# Patient Record
Sex: Female | Born: 1980 | Race: White | Hispanic: No | Marital: Married | Smoking: Never smoker
Health system: Southern US, Community
[De-identification: ages and names within clinical notes are randomized; demographics above are authoritative.]

## PROBLEM LIST (undated history)

## (undated) DIAGNOSIS — J302 Other seasonal allergic rhinitis: Secondary | ICD-10-CM

## (undated) DIAGNOSIS — Z1589 Genetic susceptibility to other disease: Secondary | ICD-10-CM

## (undated) DIAGNOSIS — R112 Nausea with vomiting, unspecified: Secondary | ICD-10-CM

## (undated) DIAGNOSIS — N7011 Chronic salpingitis: Secondary | ICD-10-CM

## (undated) DIAGNOSIS — I10 Essential (primary) hypertension: Secondary | ICD-10-CM

## (undated) DIAGNOSIS — B029 Zoster without complications: Secondary | ICD-10-CM

## (undated) DIAGNOSIS — E282 Polycystic ovarian syndrome: Secondary | ICD-10-CM

## (undated) DIAGNOSIS — Z87442 Personal history of urinary calculi: Secondary | ICD-10-CM

## (undated) DIAGNOSIS — J45909 Unspecified asthma, uncomplicated: Secondary | ICD-10-CM

## (undated) DIAGNOSIS — N809 Endometriosis, unspecified: Secondary | ICD-10-CM

## (undated) DIAGNOSIS — E785 Hyperlipidemia, unspecified: Secondary | ICD-10-CM

## (undated) DIAGNOSIS — K219 Gastro-esophageal reflux disease without esophagitis: Secondary | ICD-10-CM

## (undated) DIAGNOSIS — Z9889 Other specified postprocedural states: Secondary | ICD-10-CM

## (undated) HISTORY — PX: WISDOM TOOTH EXTRACTION: SHX21

---
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
## 2000-03-27 ENCOUNTER — Emergency Department (HOSPITAL_COMMUNITY): Admission: EM | Admit: 2000-03-27 | Discharge: 2000-03-27 | Payer: Self-pay | Admitting: Emergency Medicine

## 2001-03-23 ENCOUNTER — Other Ambulatory Visit: Admission: RE | Admit: 2001-03-23 | Discharge: 2001-03-23 | Payer: Self-pay | Admitting: *Deleted

## 2002-11-23 ENCOUNTER — Other Ambulatory Visit: Admission: RE | Admit: 2002-11-23 | Discharge: 2002-11-23 | Payer: Self-pay | Admitting: Obstetrics and Gynecology

## 2003-05-05 ENCOUNTER — Other Ambulatory Visit: Admission: RE | Admit: 2003-05-05 | Discharge: 2003-05-05 | Payer: Self-pay | Admitting: Obstetrics and Gynecology

## 2003-12-29 ENCOUNTER — Other Ambulatory Visit: Admission: RE | Admit: 2003-12-29 | Discharge: 2003-12-29 | Payer: Self-pay | Admitting: Obstetrics and Gynecology

## 2006-10-21 HISTORY — PX: EXTRACORPOREAL SHOCK WAVE LITHOTRIPSY: SHX1557

## 2007-08-24 ENCOUNTER — Emergency Department (HOSPITAL_COMMUNITY): Admission: EM | Admit: 2007-08-24 | Discharge: 2007-08-24 | Payer: Self-pay | Admitting: Emergency Medicine

## 2007-12-10 ENCOUNTER — Emergency Department (HOSPITAL_COMMUNITY): Admission: EM | Admit: 2007-12-10 | Discharge: 2007-12-10 | Payer: Self-pay | Admitting: Emergency Medicine

## 2008-07-28 ENCOUNTER — Emergency Department (HOSPITAL_COMMUNITY): Admission: EM | Admit: 2008-07-28 | Discharge: 2008-07-28 | Payer: Self-pay | Admitting: Emergency Medicine

## 2009-11-25 IMAGING — CT CT ABDOMEN W/O CM
1 of 2 series · 13 of 32 positions shown, 19 images · non-contrast
Comparison: 12/10/2007

CT ABDOMEN

CLINICAL DATA: Left flank pain.  Hematuria.  History kidney
stones.

CT ABDOMEN AND PELVIS WITHOUT CONTRAST
TECHNIQUE: Multidetector CT imaging of the abdomen and pelvis was
performed followig the standard protocol without intravenous
contrast.

[Series 4: 220 stone 4.0 b70f st · axial · 0.74mm/px · z∈[+864,+1259]mm · 13 of 93 slices shown, 19 images]
[im 7/93  soft-tissue]
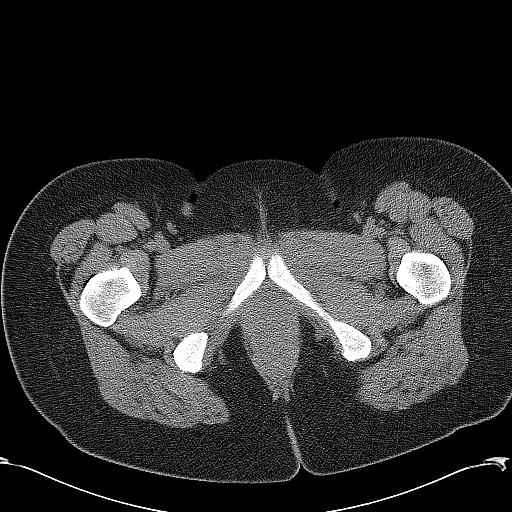
[im 7/93  bone]
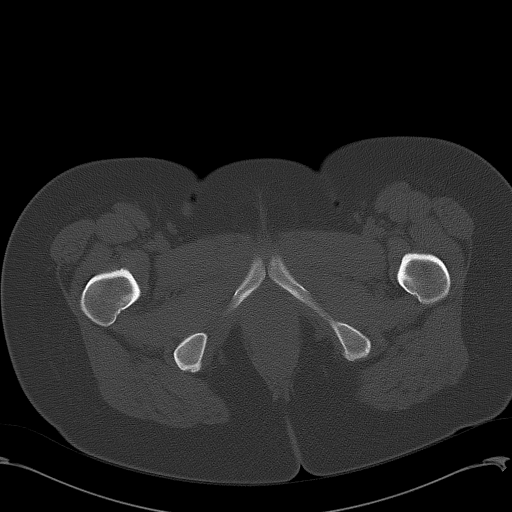
[im 13/93  soft-tissue]
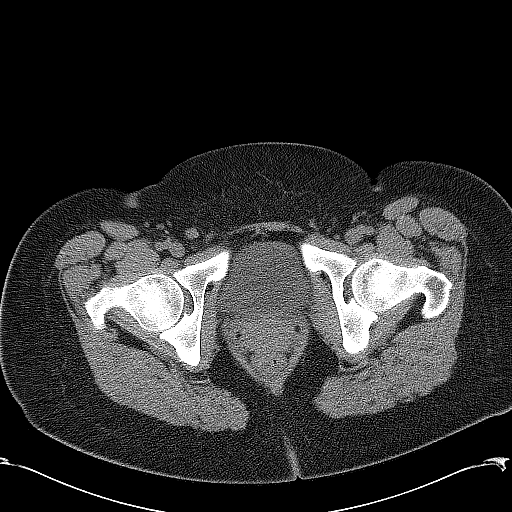
[im 19/93  soft-tissue]
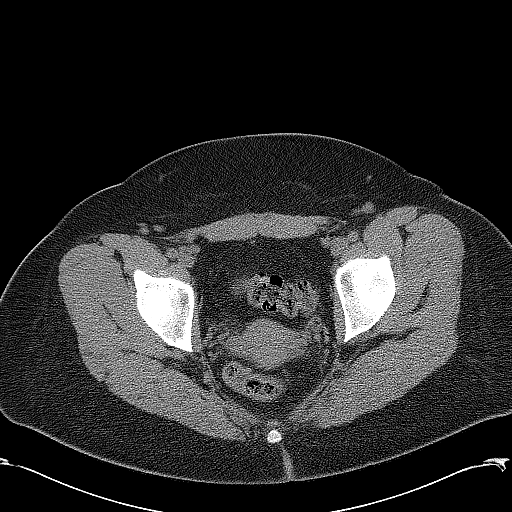
[im 25/93  soft-tissue]
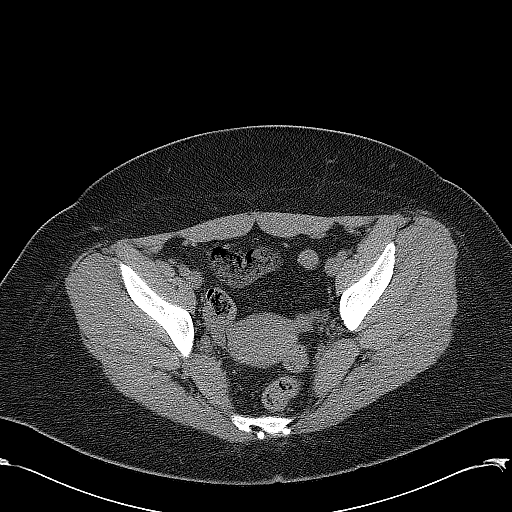
[im 31/93  soft-tissue]
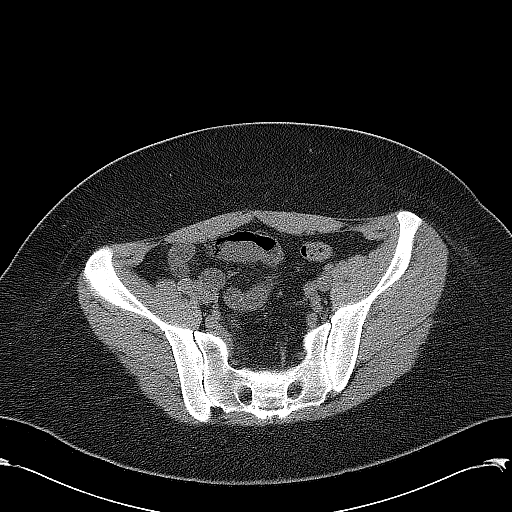
[im 37/93  soft-tissue]
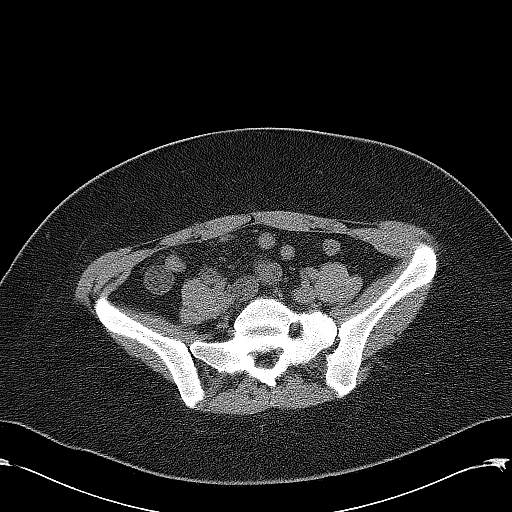
[im 50/93  soft-tissue]
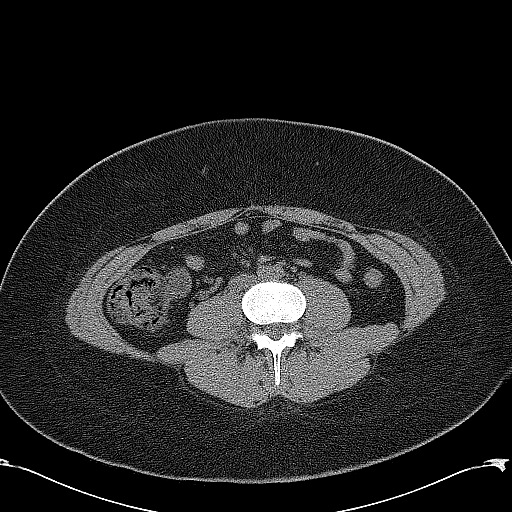
[im 56/93  soft-tissue]
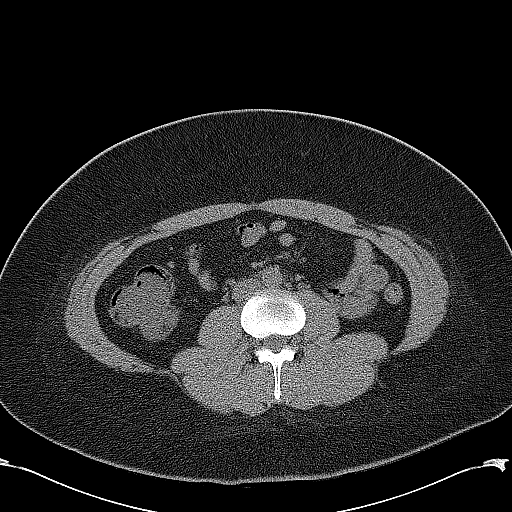
[im 62/93  soft-tissue]
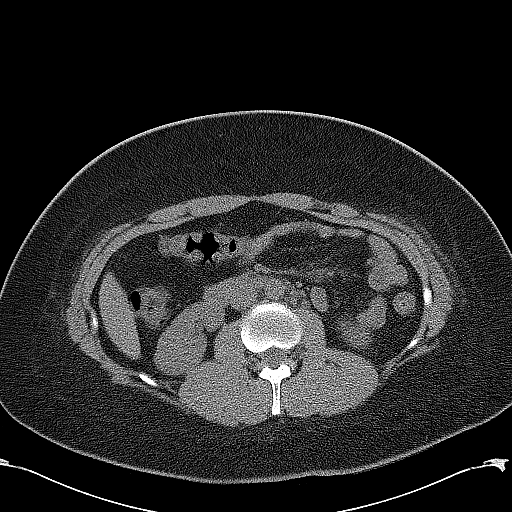
[im 62/93  bone]
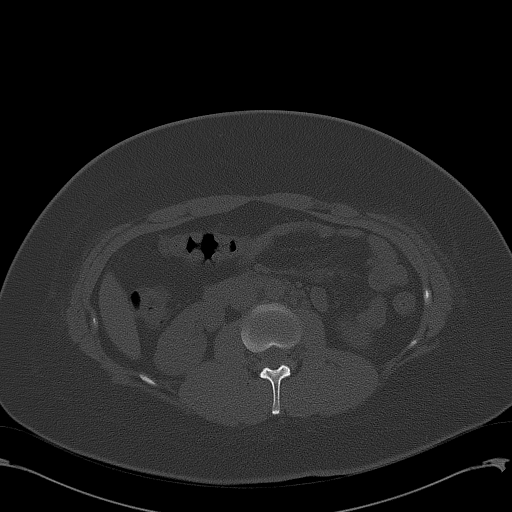
[im 68/93  soft-tissue]
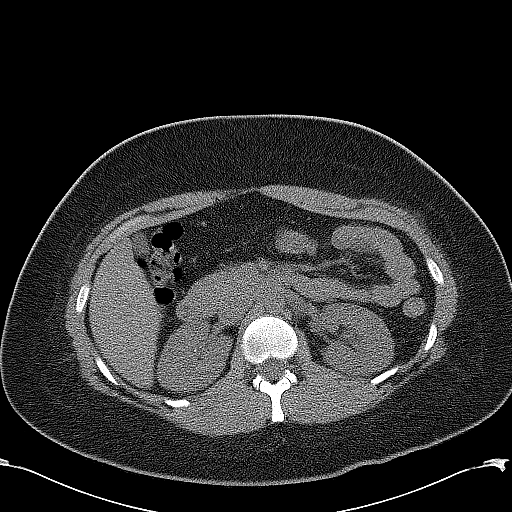
[im 68/93  lung]
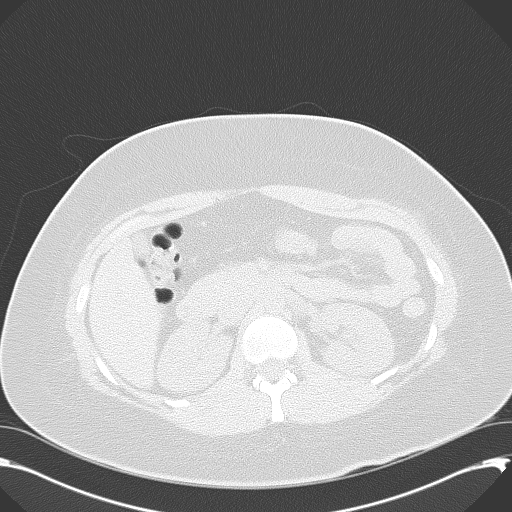
[im 74/93  soft-tissue]
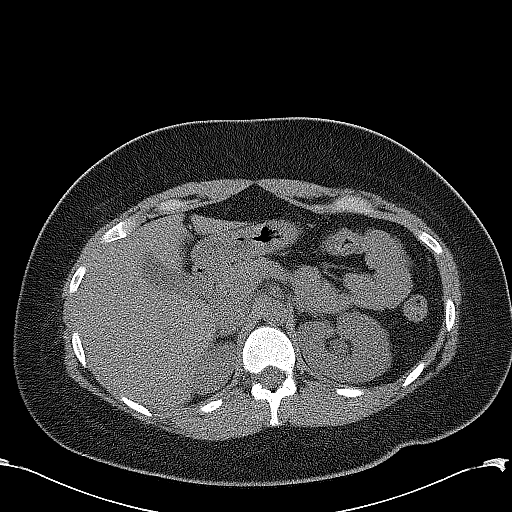
[im 74/93  lung]
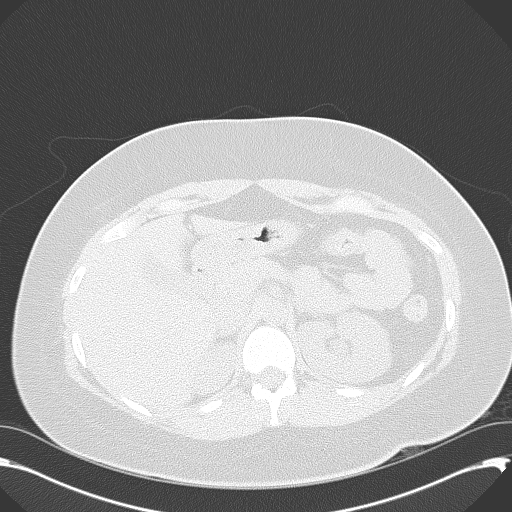
[im 80/93  soft-tissue]
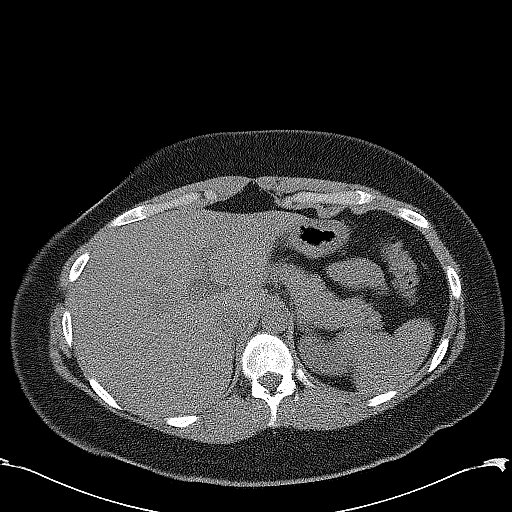
[im 80/93  lung]
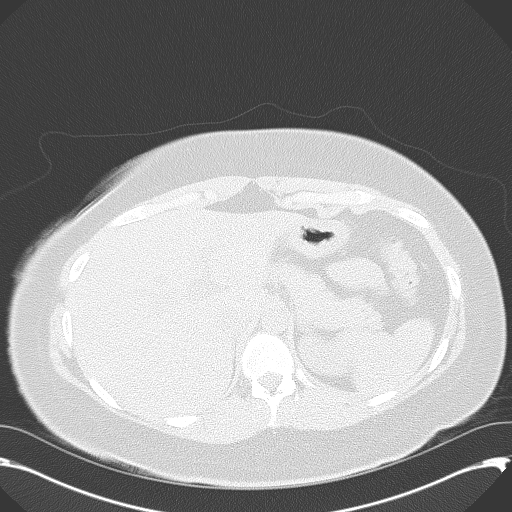
[im 86/93  soft-tissue]
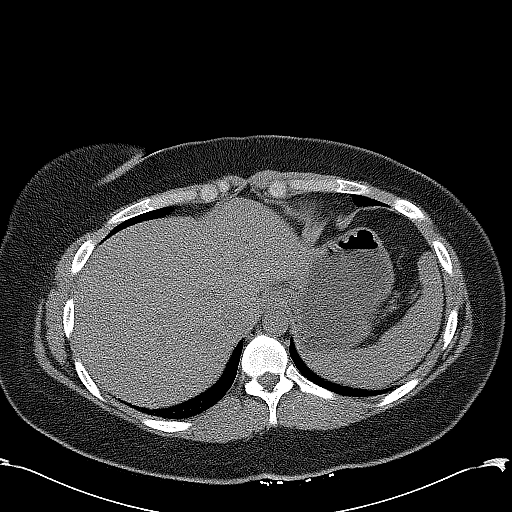
[im 86/93  lung]
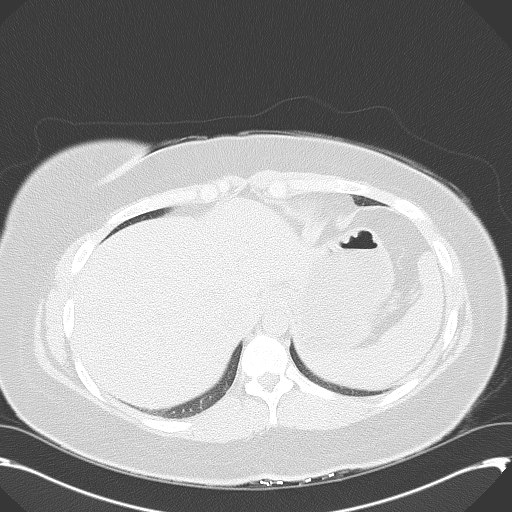

[13 of 32 positions shown; findings below may reference images not displayed]

FINDINGS: There are multiple tiny stones in the right kidney and
there is a tiny stone in the upper pole of the left kidney.  A tiny
stone that was present in the lower pole of the left kidney on the
prior exam is no longer visible in the kidney.  There is slight
distention of the left ureter.

The visualized portions of the liver and spleen are normal.
Pancreas and adrenal glands are normal.  No dilated bowel.  No
adenopathy or bony abnormality of significance.
IMPRESSION: Mild distention of the left ureter.  Multiple bilateral renal
calculi.

CT PELVIS
FINDINGS: There is a 2 mm stone in the distal left ureter at the
left ureterovesicle junction.  There is only minimal distention of
the distal left ureter.  Right distal ureter is normal.  Appendix
and terminal ileum appear normal.  Uterus and ovaries are normal.
No free fluid.  No significant bony abnormality.
IMPRESSION: 2 mm stone at the left ureterovesicle junction causing mild
dilatation of the left ureter.

## 2010-02-24 ENCOUNTER — Emergency Department (HOSPITAL_COMMUNITY): Admission: EM | Admit: 2010-02-24 | Discharge: 2010-02-24 | Payer: Self-pay | Admitting: Emergency Medicine

## 2011-01-08 LAB — URINALYSIS, ROUTINE W REFLEX MICROSCOPIC
Bilirubin Urine: NEGATIVE
Glucose, UA: NEGATIVE mg/dL
Hgb urine dipstick: NEGATIVE
Ketones, ur: NEGATIVE mg/dL
Nitrite: NEGATIVE
Protein, ur: NEGATIVE mg/dL
Urobilinogen, UA: 0.2 mg/dL (ref 0.0–1.0)
pH: 6 (ref 5.0–8.0)

## 2011-01-08 LAB — DIFFERENTIAL
Monocytes Absolute: 0.7 10*3/uL (ref 0.1–1.0)
Monocytes Relative: 5 % (ref 3–12)
Neutro Abs: 9.6 10*3/uL — ABNORMAL HIGH (ref 1.7–7.7)
Neutrophils Relative %: 71 % (ref 43–77)

## 2011-01-08 LAB — POCT I-STAT, CHEM 8
BUN: 11 mg/dL (ref 6–23)
Calcium, Ion: 1.16 mmol/L (ref 1.12–1.32)
Chloride: 110 mEq/L (ref 96–112)
Potassium: 3.6 mEq/L (ref 3.5–5.1)
Sodium: 141 mEq/L (ref 135–145)

## 2011-01-08 LAB — CBC
MCHC: 34 g/dL (ref 30.0–36.0)
RBC: 4.81 MIL/uL (ref 3.87–5.11)
WBC: 13.6 10*3/uL — ABNORMAL HIGH (ref 4.0–10.5)

## 2011-01-08 LAB — URINE CULTURE: Colony Count: 70000

## 2011-07-15 LAB — URINE MICROSCOPIC-ADD ON

## 2011-07-15 LAB — URINALYSIS, ROUTINE W REFLEX MICROSCOPIC
Bilirubin Urine: NEGATIVE
Glucose, UA: NEGATIVE
Protein, ur: NEGATIVE

## 2011-07-15 LAB — POCT PREGNANCY, URINE
Operator id: 10282
Preg Test, Ur: NEGATIVE

## 2011-07-15 LAB — DIFFERENTIAL
Eosinophils Absolute: 0.3
Monocytes Relative: 7

## 2011-07-15 LAB — CBC
MCV: 88.1
Platelets: 360
RDW: 12.1
WBC: 9.9

## 2011-07-15 LAB — COMPREHENSIVE METABOLIC PANEL
ALT: 12
Alkaline Phosphatase: 57
Calcium: 9.3
Chloride: 110
GFR calc Af Amer: >60
GFR calc non Af Amer: >60
Potassium: 3.7
Sodium: 143
Total Bilirubin: 1
Total Protein: 7.5

## 2011-07-15 LAB — URINE CULTURE

## 2011-07-22 LAB — CBC
HCT: 39.7
Hemoglobin: 13.8
MCV: 90.6
Platelets: 438 — ABNORMAL HIGH
RBC: 4.38

## 2011-07-22 LAB — DIFFERENTIAL
Basophils Absolute: 0.2 — ABNORMAL HIGH
Basophils Relative: 1
Lymphocytes Relative: 19
Lymphs Abs: 2.5
Monocytes Absolute: 0.7
Monocytes Relative: 5
Neutro Abs: 9.7 — ABNORMAL HIGH

## 2011-07-22 LAB — BASIC METABOLIC PANEL WITH GFR
BUN: 11
CO2: 22
Calcium: 8.3 — ABNORMAL LOW
Chloride: 113 — ABNORMAL HIGH
GFR calc non Af Amer: >60
Potassium: 3.8
Sodium: 141

## 2011-07-22 LAB — URINALYSIS, ROUTINE W REFLEX MICROSCOPIC
Bilirubin Urine: NEGATIVE
Glucose, UA: NEGATIVE
Ketones, ur: NEGATIVE
Protein, ur: NEGATIVE
Specific Gravity, Urine: 1.023
pH: 5.5

## 2011-07-22 LAB — URINE MICROSCOPIC-ADD ON

## 2011-07-22 LAB — BASIC METABOLIC PANEL
Creatinine, Ser: 0.74
GFR calc Af Amer: >60
Glucose, Bld: 105 — ABNORMAL HIGH

## 2011-07-22 LAB — PREGNANCY, URINE: Preg Test, Ur: NEGATIVE

## 2011-07-30 LAB — COMPREHENSIVE METABOLIC PANEL
BUN: 9
CO2: 22
Chloride: 106
Creatinine, Ser: 0.65
GFR calc non Af Amer: >60
Glucose, Bld: 83
Total Bilirubin: 0.3

## 2011-07-30 LAB — CBC
HCT: 38.4
Hemoglobin: 13.3
MCV: 89.1
Platelets: 507 — ABNORMAL HIGH
WBC: 11.1 — ABNORMAL HIGH

## 2011-07-30 LAB — T4: T4, Total: 11.8

## 2011-07-30 LAB — TSH: TSH: 1.939

## 2011-07-30 LAB — POCT URINALYSIS DIP (DEVICE)
Bilirubin Urine: NEGATIVE
Ketones, ur: NEGATIVE
Operator id: 235561
Protein, ur: NEGATIVE
Specific Gravity, Urine: <=1.005

## 2011-07-30 LAB — DIFFERENTIAL
Basophils Absolute: 0.1
Basophils Relative: 1
Lymphocytes Relative: 24
Neutro Abs: 7.4
Neutrophils Relative %: 67

## 2011-07-30 LAB — LIPASE, BLOOD: Lipase: 28

## 2015-03-10 ENCOUNTER — Other Ambulatory Visit: Payer: Self-pay | Admitting: Obstetrics and Gynecology

## 2015-03-16 ENCOUNTER — Encounter (HOSPITAL_COMMUNITY): Payer: Self-pay

## 2015-03-16 ENCOUNTER — Encounter (HOSPITAL_COMMUNITY)
Admission: RE | Admit: 2015-03-16 | Discharge: 2015-03-16 | Disposition: A | Discharge status: Home / Self Care | Payer: 59 | Source: Ambulatory Visit | Attending: Obstetrics and Gynecology | Admitting: Obstetrics and Gynecology

## 2015-03-16 DIAGNOSIS — Z01818 Encounter for other preprocedural examination: Secondary | ICD-10-CM | POA: Diagnosis present

## 2015-03-16 HISTORY — DX: Unspecified asthma, uncomplicated: J45.909

## 2015-03-16 HISTORY — DX: Gastro-esophageal reflux disease without esophagitis: K21.9

## 2015-03-16 HISTORY — DX: Essential (primary) hypertension: I10

## 2015-03-16 HISTORY — DX: Hyperlipidemia, unspecified: E78.5

## 2015-03-16 HISTORY — DX: Other specified postprocedural states: R11.2

## 2015-03-16 HISTORY — DX: Other seasonal allergic rhinitis: J30.2

## 2015-03-16 HISTORY — DX: Nausea with vomiting, unspecified: Z98.890

## 2015-03-16 HISTORY — DX: Personal history of urinary calculi: Z87.442

## 2015-03-16 LAB — BASIC METABOLIC PANEL
Anion gap: 6 (ref 5–15)
BUN: 15 mg/dL (ref 6–20)
CHLORIDE: 105 mmol/L (ref 101–111)
CO2: 24 mmol/L (ref 22–32)
Calcium: 9.2 mg/dL (ref 8.9–10.3)
Creatinine, Ser: 0.73 mg/dL (ref 0.44–1.00)
GFR calc Af Amer: >60 mL/min (ref >60)
GFR calc non Af Amer: >60 mL/min (ref >60)
Glucose, Bld: 86 mg/dL (ref 65–99)
Potassium: 3.6 mmol/L (ref 3.5–5.1)
Sodium: 135 mmol/L (ref 135–145)

## 2015-03-16 LAB — CBC
HEMATOCRIT: 41.5 % (ref 36.0–46.0)
Hemoglobin: 14.2 g/dL (ref 12.0–15.0)
MCH: 30.5 pg (ref 26.0–34.0)
MCHC: 34.2 g/dL (ref 30.0–36.0)
MCV: 89.2 fL (ref 78.0–100.0)
Platelets: 434 10*3/uL — ABNORMAL HIGH (ref 150–400)
RBC: 4.65 MIL/uL (ref 3.87–5.11)
RDW: 12.8 % (ref 11.5–15.5)
WBC: 11.3 10*3/uL — ABNORMAL HIGH (ref 4.0–10.5)

## 2015-03-16 NOTE — Patient Instructions (Addendum)
   Your procedure is scheduled on:  Tuesday, June 7  Enter through the [employer] [facility] at: [place] up the phone at the desk and dial [phone] and inform us of your arrival.  Please call this number if you have any problems the morning of surgery: [phone]  Remember: Do not eat food after midnight: Monday Do not drink clear liquids after: 6 AM Tuesday, day of surgery Take these medicines the morning of surgery with a SIP OF WATER:  None.  Bring albuterol inhaler with you on day of surgery.  Do not wear jewelry, make-up, or FINGER nail polish No metal in your hair or on your body. Do not wear lotions, powders, perfumes.  You may wear deodorant.  Do not bring valuables to the hospital. Contacts, dentures or bridgework may not be worn into surgery.  Patients discharged on the day of surgery will not be allowed to drive home.  Home with husband Rodman Key cell [phone].

## 2015-03-25 NOTE — Anesthesia Preprocedure Evaluation (Addendum)
Anesthesia Evaluation  Patient identified by MRN, date of birth, ID band Patient awake    Reviewed: Allergy & Precautions, NPO status , Patient's Chart, lab work & pertinent test results, reviewed documented beta blocker date and time   History of Anesthesia Complications (+) PONV  Airway Mallampati: II   Neck ROM: Full    Dental  (+) Teeth Intact, Dental Advisory Given   Pulmonary asthma ,  breath sounds clear to auscultation        Cardiovascular hypertension, Pt. on medications Rhythm:Regular     Neuro/Psych    GI/Hepatic Neg liver ROS, GERD-  Medicated,  Endo/Other  negative endocrine ROSMorbid obesity  Renal/GU negative Renal ROS     Musculoskeletal   Abdominal (+)  Abdomen: soft.    Peds  Hematology 14/41   Anesthesia Other Findings   Reproductive/Obstetrics                           Anesthesia Physical Anesthesia Plan  ASA: II  Anesthesia Plan: General   Post-op Pain Management:    Induction: Intravenous  Airway Management Planned: Oral ETT  Additional Equipment:   Intra-op Plan:   Post-operative Plan: Extubation in OR  Informed Consent: I have reviewed the patients History and Physical, chart, labs and discussed the procedure including the risks, benefits and alternatives for the proposed anesthesia with the patient or authorized representative who has indicated his/her understanding and acceptance.     Plan Discussed with:   Anesthesia Plan Comments: (Multimodal pain RX)        Anesthesia Quick Evaluation

## 2015-03-28 ENCOUNTER — Ambulatory Visit (HOSPITAL_COMMUNITY): Payer: 59 | Admitting: Anesthesiology

## 2015-03-28 ENCOUNTER — Encounter (HOSPITAL_COMMUNITY)
Admission: RE | Disposition: A | Discharge status: Home / Self Care | Payer: Self-pay | Source: Ambulatory Visit | Attending: Obstetrics and Gynecology

## 2015-03-28 ENCOUNTER — Encounter (HOSPITAL_COMMUNITY): Payer: Self-pay | Admitting: Anesthesiology

## 2015-03-28 ENCOUNTER — Ambulatory Visit (HOSPITAL_COMMUNITY)
Admission: RE | Admit: 2015-03-28 | Discharge: 2015-03-28 | Disposition: A | Discharge status: Home / Self Care | Payer: 59 | Source: Ambulatory Visit | Attending: Obstetrics and Gynecology | Admitting: Obstetrics and Gynecology

## 2015-03-28 DIAGNOSIS — N858 Other specified noninflammatory disorders of uterus: Secondary | ICD-10-CM | POA: Diagnosis not present

## 2015-03-28 DIAGNOSIS — Z79899 Other long term (current) drug therapy: Secondary | ICD-10-CM | POA: Diagnosis not present

## 2015-03-28 DIAGNOSIS — I10 Essential (primary) hypertension: Secondary | ICD-10-CM | POA: Insufficient documentation

## 2015-03-28 DIAGNOSIS — R1032 Left lower quadrant pain: Secondary | ICD-10-CM | POA: Diagnosis present

## 2015-03-28 DIAGNOSIS — J45909 Unspecified asthma, uncomplicated: Secondary | ICD-10-CM | POA: Insufficient documentation

## 2015-03-28 DIAGNOSIS — K219 Gastro-esophageal reflux disease without esophagitis: Secondary | ICD-10-CM | POA: Insufficient documentation

## 2015-03-28 DIAGNOSIS — N736 Female pelvic peritoneal adhesions (postinfective): Secondary | ICD-10-CM | POA: Insufficient documentation

## 2015-03-28 HISTORY — PX: LAPAROSCOPY: SHX197

## 2015-03-28 LAB — HCG, SERUM, QUALITATIVE: PREG SERUM: NEGATIVE

## 2015-03-28 SURGERY — LAPAROSCOPY, DIAGNOSTIC
Anesthesia: General | Site: Abdomen

## 2015-03-28 MED ORDER — ROPIVACAINE HCL 5 MG/ML IJ SOLN
INTRAMUSCULAR | Status: AC
  Filled 2015-03-28: qty 30

## 2015-03-28 MED ORDER — OXYCODONE-ACETAMINOPHEN 5-325 MG PO TABS: 1.0000 | ORAL_TABLET | ORAL | Status: DC | PRN

## 2015-03-28 MED ORDER — SCOPOLAMINE 1 MG/3DAYS TD PT72
MEDICATED_PATCH | TRANSDERMAL | Status: AC
  Administered 2015-03-28: 1.5 mg via TRANSDERMAL
  Filled 2015-03-28: qty 1

## 2015-03-28 MED ORDER — MIDAZOLAM HCL 5 MG/5ML IJ SOLN
INTRAMUSCULAR | Status: DC | PRN
  Administered 2015-03-28: 2 mg via INTRAVENOUS

## 2015-03-28 MED ORDER — NEOSTIGMINE METHYLSULFATE 10 MG/10ML IV SOLN
INTRAVENOUS | Status: AC
  Filled 2015-03-28: qty 1

## 2015-03-28 MED ORDER — ROCURONIUM BROMIDE 100 MG/10ML IV SOLN
INTRAVENOUS | Status: AC
  Filled 2015-03-28: qty 1

## 2015-03-28 MED ORDER — SCOPOLAMINE 1 MG/3DAYS TD PT72
1.0000 | MEDICATED_PATCH | Freq: Once | TRANSDERMAL | Status: DC
  Administered 2015-03-28: 1.5 mg via TRANSDERMAL

## 2015-03-28 MED ORDER — OXYCODONE-ACETAMINOPHEN 5-325 MG PO TABS
1.0000 | ORAL_TABLET | ORAL | Status: DC | PRN
  Administered 2015-03-28: 1 via ORAL

## 2015-03-28 MED ORDER — CEFAZOLIN SODIUM-DEXTROSE 2-3 GM-% IV SOLR
INTRAVENOUS | Status: AC
  Filled 2015-03-28: qty 50

## 2015-03-28 MED ORDER — DEXAMETHASONE SODIUM PHOSPHATE 4 MG/ML IJ SOLN
INTRAMUSCULAR | Status: DC | PRN
  Administered 2015-03-28: 10 mg via INTRAVENOUS

## 2015-03-28 MED ORDER — FENTANYL CITRATE (PF) 250 MCG/5ML IJ SOLN
INTRAMUSCULAR | Status: AC
  Filled 2015-03-28: qty 5

## 2015-03-28 MED ORDER — BUPIVACAINE LIPOSOME 1.3 % IJ SUSP
20.0000 mL | Freq: Once | INTRAMUSCULAR | Status: DC
  Filled 2015-03-28: qty 20

## 2015-03-28 MED ORDER — ONDANSETRON HCL 4 MG/2ML IJ SOLN
INTRAMUSCULAR | Status: AC
  Filled 2015-03-28: qty 2

## 2015-03-28 MED ORDER — METHYLENE BLUE 1 % INJ SOLN
INTRAMUSCULAR | Status: AC
  Filled 2015-03-28: qty 1

## 2015-03-28 MED ORDER — GLYCOPYRROLATE 0.2 MG/ML IJ SOLN
INTRAMUSCULAR | Status: DC | PRN
  Administered 2015-03-28: .7 mg via INTRAVENOUS

## 2015-03-28 MED ORDER — ARTIFICIAL TEARS OP OINT
TOPICAL_OINTMENT | OPHTHALMIC | Status: AC
  Filled 2015-03-28: qty 3.5

## 2015-03-28 MED ORDER — OXYCODONE-ACETAMINOPHEN 5-325 MG PO TABS
ORAL_TABLET | ORAL | Status: DC
Start: 2015-03-28 — End: 2015-03-28
  Filled 2015-03-28: qty 1

## 2015-03-28 MED ORDER — PROPOFOL 10 MG/ML IV BOLUS
INTRAVENOUS | Status: DC | PRN
  Administered 2015-03-28: 200 mg via INTRAVENOUS

## 2015-03-28 MED ORDER — ONDANSETRON HCL 4 MG/2ML IJ SOLN
INTRAMUSCULAR | Status: DC | PRN
  Administered 2015-03-28: 4 mg via INTRAVENOUS

## 2015-03-28 MED ORDER — NEOSTIGMINE METHYLSULFATE 10 MG/10ML IV SOLN
INTRAVENOUS | Status: DC | PRN
  Administered 2015-03-28: 4 mg via INTRAVENOUS

## 2015-03-28 MED ORDER — MEPERIDINE HCL 25 MG/ML IJ SOLN: 6.2500 mg | INTRAMUSCULAR | Status: DC | PRN

## 2015-03-28 MED ORDER — STERILE WATER FOR IRRIGATION IR SOLN
Status: DC | PRN
  Administered 2015-03-28: 3000 mL

## 2015-03-28 MED ORDER — PROMETHAZINE HCL 25 MG PO TABS: 12.5000 mg | ORAL_TABLET | Freq: Four times a day (QID) | ORAL | Status: DC | PRN

## 2015-03-28 MED ORDER — LIDOCAINE HCL (CARDIAC) 20 MG/ML IV SOLN
INTRAVENOUS | Status: AC
  Filled 2015-03-28: qty 5

## 2015-03-28 MED ORDER — LIDOCAINE HCL (CARDIAC) 20 MG/ML IV SOLN
INTRAVENOUS | Status: DC | PRN
  Administered 2015-03-28 (×2): 50 mg via INTRAVENOUS

## 2015-03-28 MED ORDER — BUPIVACAINE HCL (PF) 0.25 % IJ SOLN
INTRAMUSCULAR | Status: AC
  Filled 2015-03-28: qty 10

## 2015-03-28 MED ORDER — LACTATED RINGERS IV SOLN
INTRAVENOUS | Status: DC
  Administered 2015-03-28: 125 mL/h via INTRAVENOUS

## 2015-03-28 MED ORDER — DEXAMETHASONE SODIUM PHOSPHATE 10 MG/ML IJ SOLN
INTRAMUSCULAR | Status: AC
  Filled 2015-03-28: qty 1

## 2015-03-28 MED ORDER — SODIUM CHLORIDE 0.9 % IJ SOLN
INTRAMUSCULAR | Status: AC
  Filled 2015-03-28: qty 50

## 2015-03-28 MED ORDER — BUPIVACAINE LIPOSOME 1.3 % IJ SUSP
INTRAMUSCULAR | Status: DC | PRN
  Administered 2015-03-28: 20 mL

## 2015-03-28 MED ORDER — ROCURONIUM BROMIDE 100 MG/10ML IV SOLN
INTRAVENOUS | Status: DC | PRN
  Administered 2015-03-28: 10 mg via INTRAVENOUS
  Administered 2015-03-28: 5 mg via INTRAVENOUS
  Administered 2015-03-28: 35 mg via INTRAVENOUS

## 2015-03-28 MED ORDER — CEFAZOLIN SODIUM-DEXTROSE 2-3 GM-% IV SOLR
2.0000 g | INTRAVENOUS | Status: AC
  Administered 2015-03-28: 2 g via INTRAVENOUS

## 2015-03-28 MED ORDER — PROPOFOL 10 MG/ML IV BOLUS
INTRAVENOUS | Status: AC
  Filled 2015-03-28: qty 20

## 2015-03-28 MED ORDER — PROMETHAZINE HCL 25 MG/ML IJ SOLN: 6.2500 mg | INTRAMUSCULAR | Status: DC | PRN

## 2015-03-28 MED ORDER — FENTANYL CITRATE (PF) 100 MCG/2ML IJ SOLN
INTRAMUSCULAR | Status: DC | PRN
  Administered 2015-03-28: 100 ug via INTRAVENOUS
  Administered 2015-03-28: 50 ug via INTRAVENOUS
  Administered 2015-03-28 (×3): 100 ug via INTRAVENOUS
  Administered 2015-03-28: 50 ug via INTRAVENOUS

## 2015-03-28 MED ORDER — SCOPOLAMINE 1 MG/3DAYS TD PT72: 1.0000 | MEDICATED_PATCH | TRANSDERMAL | Status: DC

## 2015-03-28 MED ORDER — MIDAZOLAM HCL 2 MG/2ML IJ SOLN
INTRAMUSCULAR | Status: AC
  Filled 2015-03-28: qty 2

## 2015-03-28 MED ORDER — FENTANYL CITRATE (PF) 100 MCG/2ML IJ SOLN: 25.0000 ug | INTRAMUSCULAR | Status: DC | PRN

## 2015-03-28 MED ORDER — HEPARIN SODIUM (PORCINE) 5000 UNIT/ML IJ SOLN
INTRAMUSCULAR | Status: AC
  Filled 2015-03-28: qty 1

## 2015-03-28 SURGICAL SUPPLY — 69 items
BAG SPEC RTRVL LRG 6X4 10 (ENDOMECHANICALS)
BARRIER ADHS 3X4 INTERCEED (GAUZE/BANDAGES/DRESSINGS) IMPLANT
BRR ADH 4X3 ABS CNTRL BYND (GAUZE/BANDAGES/DRESSINGS)
CABLE HIGH FREQUENCY MONO STRZ (ELECTRODE) IMPLANT
CATH FOLEY 3WAY  5CC 16FR (CATHETERS) ×1
CATH FOLEY 3WAY 5CC 16FR (CATHETERS) ×4 IMPLANT
CATH ROBINSON RED A/P 16FR (CATHETERS) IMPLANT
CHLORAPREP W/TINT 26ML (MISCELLANEOUS) ×5 IMPLANT
CLOTH BEACON ORANGE TIMEOUT ST (SAFETY) ×5 IMPLANT
CONT PATH 16OZ SNAP LID 3702 (MISCELLANEOUS) ×5 IMPLANT
COVER BACK TABLE 60X90IN (DRAPES) ×10 IMPLANT
COVER TIP SHEARS 8 DVNC (MISCELLANEOUS) ×4 IMPLANT
COVER TIP SHEARS 8MM DA VINCI (MISCELLANEOUS) ×1
DECANTER SPIKE VIAL GLASS SM (MISCELLANEOUS) ×5 IMPLANT
DRAPE WARM FLUID 44X44 (DRAPE) ×5 IMPLANT
DRSG COVADERM PLUS 2X2 (GAUZE/BANDAGES/DRESSINGS) ×8 IMPLANT
DRSG OPSITE POSTOP 3X4 (GAUZE/BANDAGES/DRESSINGS) ×5 IMPLANT
ELECT REM PT RETURN 9FT ADLT (ELECTROSURGICAL) ×5
ELECTRODE REM PT RTRN 9FT ADLT (ELECTROSURGICAL) ×4 IMPLANT
FORCEPS CUTTING 33CM 5MM (CUTTING FORCEPS) IMPLANT
FORCEPS CUTTING 45CM 5MM (CUTTING FORCEPS) IMPLANT
GAUZE VASELINE 3X9 (GAUZE/BANDAGES/DRESSINGS) IMPLANT
GLOVE BIO SURGEON STRL SZ7.5 (GLOVE) ×10 IMPLANT
GOWN STRL REUS W/TWL LRG LVL3 (GOWN DISPOSABLE) ×5 IMPLANT
KIT ACCESSORY DA VINCI DISP (KITS) ×1
KIT ACCESSORY DVNC DISP (KITS) ×4 IMPLANT
LEGGING LITHOTOMY PAIR STRL (DRAPES) ×5 IMPLANT
LIQUID BAND (GAUZE/BANDAGES/DRESSINGS) ×5 IMPLANT
NEEDLE HYPO 22GX1.5 SAFETY (NEEDLE) ×5 IMPLANT
NEEDLE INSUFFLATION 120MM (ENDOMECHANICALS) ×5 IMPLANT
NEEDLE INSUFFLATION 150MM (ENDOMECHANICALS) ×5 IMPLANT
PACK LAPAROSCOPY BASIN (CUSTOM PROCEDURE TRAY) ×5 IMPLANT
PACK ROBOT WH (CUSTOM PROCEDURE TRAY) ×5 IMPLANT
PACK ROBOTIC GOWN (GOWN DISPOSABLE) ×5 IMPLANT
PAD POSITIONER PINK NONSTERILE (MISCELLANEOUS) ×5 IMPLANT
PAD PREP 24X48 CUFFED NSTRL (MISCELLANEOUS) ×10 IMPLANT
POUCH SPECIMEN RETRIEVAL 10MM (ENDOMECHANICALS) IMPLANT
PROTECTOR NERVE ULNAR (MISCELLANEOUS) ×5 IMPLANT
SET CYSTO W/LG BORE CLAMP LF (SET/KITS/TRAYS/PACK) IMPLANT
SET IRRIG TUBING LAPAROSCOPIC (IRRIGATION / IRRIGATOR) ×5 IMPLANT
SET TRI-LUMEN FLTR TB AIRSEAL (TUBING) IMPLANT
SLEEVE XCEL OPT CAN 5 100 (ENDOMECHANICALS) IMPLANT
SOLUTION ELECTROLUBE (MISCELLANEOUS) ×5 IMPLANT
SUT VIC AB 0 CT1 27 (SUTURE) ×10
SUT VIC AB 0 CT1 27XBRD ANBCTR (SUTURE) ×8 IMPLANT
SUT VIC AB 0 CT1 27XBRD ANTBC (SUTURE) IMPLANT
SUT VICRYL 0 UR6 27IN ABS (SUTURE) ×9 IMPLANT
SUT VICRYL 4-0 PS2 18IN ABS (SUTURE) ×10 IMPLANT
SUT VICRYL RAPIDE 4/0 PS 2 (SUTURE) ×10 IMPLANT
SYR 50ML LL SCALE MARK (SYRINGE) ×5 IMPLANT
SYR CONTROL 10ML LL (SYRINGE) ×5 IMPLANT
SYRINGE 10CC LL (SYRINGE) ×5 IMPLANT
SYSTEM CONVERTIBLE TROCAR (TROCAR) IMPLANT
TIP UTERINE 5.1X6CM LAV DISP (MISCELLANEOUS) IMPLANT
TIP UTERINE 6.7X10CM GRN DISP (MISCELLANEOUS) IMPLANT
TIP UTERINE 6.7X6CM WHT DISP (MISCELLANEOUS) IMPLANT
TIP UTERINE 6.7X8CM BLUE DISP (MISCELLANEOUS) ×4 IMPLANT
TOWEL OR 17X24 6PK STRL BLUE (TOWEL DISPOSABLE) ×15 IMPLANT
TRAY FOLEY CATH SILVER 14FR (SET/KITS/TRAYS/PACK) ×5 IMPLANT
TROCAR DISP BLADELESS 8 DVNC (TROCAR) ×4 IMPLANT
TROCAR DISP BLADELESS 8MM (TROCAR) ×1
TROCAR OPTI TIP 5M 100M (ENDOMECHANICALS) ×5 IMPLANT
TROCAR PORT AIRSEAL 5X120 (TROCAR) IMPLANT
TROCAR XCEL 12X100 BLDLESS (ENDOMECHANICALS) IMPLANT
TROCAR XCEL DIL TIP R 11M (ENDOMECHANICALS) ×5 IMPLANT
TROCAR XCEL NON-BLD 5MMX100MML (ENDOMECHANICALS) ×5 IMPLANT
TROCAR Z-THREAD 12X150 (TROCAR) ×5 IMPLANT
WARMER LAPAROSCOPE (MISCELLANEOUS) ×5 IMPLANT
WATER STERILE IRR 1000ML POUR (IV SOLUTION) ×15 IMPLANT

## 2015-03-28 NOTE — H&P (Signed)
NAMEDIM, MEISINGER             ACCOUNT NO.:  [id number]  MEDICAL RECORD NO.:  [id number]  LOCATION:  [facility]                         FACILITY:  [facility]  PHYSICIAN:  Lovenia Kim, M.D.DATE OF BIRTH:  [birth year]  DATE OF ADMISSION:  02/27/2015 DATE OF DISCHARGE:                             HISTORY & PHYSICAL   CHIEF COMPLAINT:  Pelvic pain with questionable left tubal dilatation.  HISTORY OF PRESENT ILLNESS:  The patient is a 34 year old white female, G0, P0 with questionable occluded left tube on HSG or diagnostic laparoscopy, possible adhesiolysis, possible excision ablation of endometriosis, possible chromopertubation.  ALLERGIES:  She has no known drug allergies.  MEDICATIONS:  Include metformin, nasal steroid, Zyrtec, metoprolol, albuterol, and Xolair.  SOCIAL HISTORY:  She is a nonsmoker, nondrinker.  She denies domestic or physical violence.  FAMILY HISTORY:  Lupus, heart disease, hypertension, and diabetes.  PHYSICAL EXAMINATION:  GENERAL:  On physical exam, she is a well developed, well-nourished white female, in no acute distress. HEENT:  Normal. NECK:  Supple.  Full range of motion. LUNGS:  Clear. HEART:  Regular rate and rhythm. ABDOMEN:  Soft, nontender. PELVIC:  A normal size uterus and no adnexal masses, possible right adnexal tenderness, and left adnexal tenderness are mild to palpation. EXTREMITIES:  Reveal no cords. NEUROLOGIC:  Nonfocal. SKIN:  Intact.  IMPRESSION:  Questionable tubal occlusion on an HSG or diagnostic laparoscopy, possible adhesiolysis, possible excision ablation of endometriosis.  PLAN:  Plan is to proceed with diagnostic laparoscopy, possible da Vinci assisted adhesiolysis excision and ablation of endometriosis, and possible chromopertubation.  Risks of anesthesia, infection, bleeding, injury to surrounding organs with possible need for repair was discussed.  Delayed versus immediate complications to include bowel and bladder  injury were discussed.  The patient wishes to proceed.     Lovenia Kim, M.D.     RJT/MEDQ  D:  03/28/2015  T:  03/28/2015  Job:  [id number]

## 2015-03-28 NOTE — Anesthesia Postprocedure Evaluation (Signed)
  Anesthesia Post-op Note  Patient: Rachel Hooper  Procedure(s) Performed: Procedure(s): LAPAROSCOPY DIAGNOSTIC (N/A)  Patient Location: PACU  Anesthesia Type:General  Level of Consciousness: awake, alert  and oriented  Airway and Oxygen Therapy: Patient Spontanous Breathing and Patient connected to nasal cannula oxygen  Post-op Pain: none  Post-op Assessment: Post-op Vital signs reviewed and Patient's Cardiovascular Status Stable              Post-op Vital Signs: Reviewed and stable  Last Vitals:  Filed Vitals:   03/28/15 1445  BP: 109/68  Pulse: 81  Temp:   Resp: 23    Complications: No apparent anesthesia complications

## 2015-03-28 NOTE — Discharge Instructions (Signed)
DISCHARGE INSTRUCTIONS: Laparoscopy   MAY TAKE OFF PATCH BEHIND YOUR EAR IN 48 HOURS!! REMOVE DRESSING FROM UMBILICUS IN 48 HOURS!!  The following instructions have been prepared to help you care for yourself upon your return home today.  Wound care:  Do not get the incision wet for the first 24 hours. The incision should be kept clean and dry.  The Band-Aids or dressings may be removed the day after surgery.  Should the incision become sore, red, and swollen after the first week, check with your doctor.  Personal hygiene:  Shower the day after your procedure.  Activity and limitations:  Do NOT drive or operate any equipment today.  Do NOT lift anything more than 15 pounds for 2-3 weeks after surgery.  Do NOT rest in bed all day.  Walking is encouraged. Walk each day, starting slowly with 5-minute walks 3 or 4 times a day. Slowly increase the length of your walks.  Walk up and down stairs slowly.  Do NOT do strenuous activities, such as golfing, playing tennis, bowling, running, biking, weight lifting, gardening, mowing, or vacuuming for 2-4 weeks. Ask your doctor when it is okay to start.  Diet: Eat a light meal as desired this evening. You may resume your usual diet tomorrow.  Return to work: This is dependent on the type of work you do. For the most part you can return to a desk job within a week of surgery. If you are more active at work, please discuss this with your doctor.  What to expect after your surgery: You may have a slight burning sensation when you urinate on the first day. You may have a very small amount of blood in the urine. Expect to have a small amount of vaginal discharge/light bleeding for 1-2 weeks. It is not unusual to have abdominal soreness and bruising for up to 2 weeks. You may be tired and need more rest for about 1 week. You may experience shoulder pain for 24-72 hours. Lying flat in bed may relieve it.  Call your doctor for any of the  following:  Develop a fever of 100.4 or greater  Inability to urinate 6 hours after discharge from hospital  Severe pain not relieved by pain medications  Persistent of heavy bleeding at incision site  Redness or swelling around incision site after a week  Increasing nausea or vomiting  Patient Signature________________________________________ Nurse Signature_________________________________________

## 2015-03-28 NOTE — Progress Notes (Signed)
Patient ID: Rachel Hooper, female   DOB: 1980/12/26, 34 y.o.   MRN: [id number] Patient seen and examined. Consent witnessed and signed. No changes noted. Update completed.

## 2015-03-28 NOTE — Anesthesia Postprocedure Evaluation (Signed)
  Anesthesia Post-op Note  Patient: Rachel Hooper  Procedure(s) Performed: Procedure(s): LAPAROSCOPY DIAGNOSTIC (N/A)  Patient Location: PACU  Anesthesia Type:General  Level of Consciousness: awake and alert   Airway and Oxygen Therapy: Patient Spontanous Breathing  Post-op Pain: none  Post-op Assessment: Post-op Vital signs reviewed, Patient's Cardiovascular Status Stable, Respiratory Function Stable, RESPIRATORY FUNCTION UNSTABLE and No signs of Nausea or vomiting  Post-op Vital Signs: Reviewed and stable  Last Vitals:  Filed Vitals:   03/28/15 1150  BP: 135/89  Pulse: 95  Temp: 37.1 C  Resp: 16    Complications: No apparent anesthesia complications

## 2015-03-28 NOTE — Transfer of Care (Signed)
Immediate Anesthesia Transfer of Care Note  Patient: Rachel Hooper  Procedure(s) Performed: Procedure(s): LAPAROSCOPY DIAGNOSTIC (N/A)  Patient Location: PACU  Anesthesia Type:General  Level of Consciousness: awake, alert  and oriented  Airway & Oxygen Therapy: Patient Spontanous Breathing and Patient connected to nasal cannula oxygen  Post-op Assessment: Report given to RN and Post -op Vital signs reviewed and stable  Post vital signs: Reviewed and stable  Last Vitals:  Filed Vitals:   03/28/15 1445  BP: 109/68  Pulse: 81  Temp:   Resp: 23    Complications: No apparent anesthesia complications

## 2015-03-28 NOTE — Op Note (Signed)
Rachel Hooper, Rachel Hooper             ACCOUNT NO.:  [id number]  MEDICAL RECORD NO.:  [id number]  LOCATION:  [facility]                          FACILITY:  [facility]  PHYSICIAN:  Lovenia Kim, M.D.DATE OF BIRTH:  1981-08-31  DATE OF PROCEDURE: DATE OF DISCHARGE:  03/28/2015                              OPERATIVE REPORT   PREOPERATIVE DIAGNOSIS:  Left lower quadrant pain, questionable left tubal dilatation.  POSTOPERATIVE DIAGNOSES:  Severe pelvic adhesions with obliteration of the anterior and posterior cul-de-sac.  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  PROCEDURE:  Diagnostic laparoscopy.  SURGEON:  Lovenia Kim, M.D.  ASSISTANT:  None.  ANESTHESIA:  General.  ESTIMATED BLOOD LOSS:  Less than 50 mL.  COMPLICATIONS:  None.  DRAINS:  None.  COUNTS:  Correct.  DISPOSITION:  The patient to recovery in good condition.  BRIEF OPERATIVE NOTE:  After being apprised of the risks of anesthesia, infection, bleeding, injury to surrounding organs, possible need for repair, delayed versus immediate complications to include bowel and bladder injury, possible need for repair, the patient was brought to the operating room where she was administered a general anesthetic without complications.  She was prepped and draped in usual sterile fashion. Foley catheter placed.  A RUMI retractor was placed vaginally without difficulty.  Exam under anesthesia revealed a midposition to anteflexed uterus slightly in mobile under anesthesia and no adnexal masses were appreciated.  No nodularity was appreciated in the cul-de-sac, rectal exam was not performed.  At this time, an infraumbilical incision was made with a scalpel.  Veress needle was placed with opening pressure of 2.  It was noted 3 liters of CO2 insufflated without difficulty. Atraumatic trocar entry was visualized and the camera was placed. Visualization revealed no evidence of anterior abdominal wall adhesions cephalad or periumbilical.   There were no perihepatic adhesions and the diaphragm appeared clear.  The omentum appeared clean.  At this time, deep Trendelenburg position was established and visualization revealed some adhesions of the left sigmoid mesentery to the anterior abdominal wall.  Complete obliteration of the anterior cul-de-sac of the uterovesical space with the bladder flap adherent just to the uterine fundus, which was barely visualized and the posterior cul-de-sac also densely adherent to the fundus.  There was no ability to visualize the left or right tubal or adnexal structures.  At this time, decision was made to terminate the procedure.  Prior to the procedure, the patient had not been bowel prepped and also was definitive and not wanting to proceed with a need for exploratory laparotomy.  Due to the fact that this repair would be further more a possibly complicated case with the need for prolonged recovery and/or possible exploratory laparotomy, decision was made to terminate the case at this time and discussed further management with the patient subsequent to the termination of the case.  Therefore, all instruments were removed under direct visualization.  CO2 was released.  Incisions were closed using 0 Vicryl, 4-0 Vicryl, and Dermabond.  Exparel was placed, a 10 mL of dilute Marcaine, Exparel solution total.  All instruments were removed from the vagina.  Pictures have not been taken during the case.  The patient tolerated the procedure well, was awakened  and transferred to recovery in good condition.     Lovenia Kim, M.D.     RJT/MEDQ  D:  03/28/2015  T:  03/28/2015  Job:  [id number]

## 2015-03-28 NOTE — Op Note (Signed)
03/28/2015  1:40 PM  PATIENT:  Rachel Hooper  34 y.o. female  PRE-OPERATIVE DIAGNOSIS:  Left Lower Quadrant Pain  POST-OPERATIVE DIAGNOSIS:  Left lower quadrant pain Severe pelvic adhesions with obliterated anterior and posterior cul de sac Likely severe endometriosis  PROCEDURE:  Procedure(s): LAPAROSCOPY DIAGNOSTIC  SURGEON:  Surgeon(s): Brien Few, MD  ASSISTANTS: Renato Battles, CNM   ANESTHESIA:   local and general  ESTIMATED BLOOD LOSS: minimal  DRAINS: none   LOCAL MEDICATIONS USED:  MARCAINE    and Amount: 10 ml  SPECIMEN:  No Specimen  DISPOSITION OF SPECIMEN:  N/A  COUNTS:  YES  DICTATION #: [id number]  PLAN OF CARE: dc home  PATIENT DISPOSITION:  PACU - hemodynamically stable.

## 2015-03-29 ENCOUNTER — Encounter (HOSPITAL_COMMUNITY): Payer: Self-pay | Admitting: Obstetrics and Gynecology

## 2015-04-28 ENCOUNTER — Encounter (HOSPITAL_BASED_OUTPATIENT_CLINIC_OR_DEPARTMENT_OTHER): Payer: Self-pay | Admitting: *Deleted

## 2015-04-28 NOTE — Progress Notes (Signed)
NPO AFTER MN.  ARRIVE AT 0800.  NEEDS ISTAT AND EKG.  WILL TAKE NEXIUM AM DOS W/ SIPS OF WATER.

## 2015-05-02 NOTE — H&P (Signed)
Rachel Hooper is a 34 y.o. female , originally referred to me by Dr. Ronita Hipps, for endometriosis and infertility.  She was diagnosed with left hydrosalpinx by recent HSG. Patient would like to preserve her childbearing potential.  Pertinent Gynecological History: Menses: normal Bleeding: normal Contraception: none DES exposure: denies Blood transfusions: none Sexually transmitted diseases: no past history Previous GYN Procedures: Laparoscopy  Last mammogram: normal Last pap: normal  OB History: G0   Menstrual History: Menarche age: 89 No LMP recorded.    Past Medical History  Diagnosis Date  . Seasonal allergies   . Asthma   . Hypertension   . Hyperlipidemia     diet controlled, no med  . GERD (gastroesophageal reflux disease)   . History of kidney stones   . Endometriosis   . Hydrosalpinx     left  . PONV (postoperative nausea and vomiting)     severe                    Past Surgical History  Procedure Laterality Date  . Anterior cruciate ligament repair Right 1995  . Wisdom tooth extraction    . Laparoscopy N/A 03/28/2015    Procedure: LAPAROSCOPY DIAGNOSTIC;  Surgeon: Brien Few, MD;  Location: [facility];  Service: Gynecology;  Laterality: N/A;  . Extracorporeal shock wave lithotripsy  2008             History reviewed. No pertinent family history. No hereditary disease.  No cancer of breast, ovary, uterus. No cutaneous leiomyomatosis or renal cell carcinoma.  History   Social History  . Marital Status: Married    Spouse Name: N/A  . Number of Children: N/A  . Years of Education: N/A   Occupational History  . Not on file.   Social History Main Topics  . Smoking status: Never Smoker   . Smokeless tobacco: Never Used  . Alcohol Use: No  . Drug Use: No  . Sexual Activity: Yes    Birth Control/ Protection: None   Other Topics Concern  . Not on file   Social History Narrative    No Known Allergies  No current facility-administered  medications on file prior to encounter.   Current Outpatient Prescriptions on File Prior to Encounter  Medication Sig Dispense Refill  . albuterol (PROVENTIL HFA;VENTOLIN HFA) 108 (90 BASE) MCG/ACT inhaler Inhale 2 puffs into the lungs every 6 (six) hours as needed for wheezing or shortness of breath.    . cetirizine (ZYRTEC) 10 MG tablet Take 10 mg by mouth at bedtime.     . clindamycin-benzoyl peroxide (BENZACLIN) gel Apply 1 application topically 2 (two) times daily as needed (For acne.).    Marland Kitchen esomeprazole (NEXIUM) 20 MG capsule Take 20 mg by mouth as needed.    . magnesium oxide-pyridoxine (BEELITH) 362-20 MG TABS Take 1 tablet by mouth daily.    . metoprolol succinate (TOPROL-XL) 100 MG 24 hr tablet Take 100 mg by mouth at bedtime. Take with or immediately following a meal.    . mometasone (ASMANEX) 220 MCG/INH inhaler Inhale 1-2 puffs into the lungs at bedtime.     . mometasone (NASONEX) 50 MCG/ACT nasal spray Place 2 sprays into the nose daily.    . montelukast (SINGULAIR) 10 MG tablet Take 10 mg by mouth at bedtime.    . phenylephrine (NEO-SYNEPHRINE) 1 % nasal spray Place 2 drops into both nostrils every 6 (six) hours as needed for congestion.    . potassium citrate (UROCIT-K) 10 MEQ (  1080 MG) SR tablet Take 20 mEq by mouth every evening.        Review of Systems  Constitutional: Negative.   HENT: Negative.   Eyes: Negative.   Respiratory: Negative.   Cardiovascular: Negative.   Gastrointestinal: Negative.   Genitourinary: Negative.   Musculoskeletal: Negative.   Skin: Negative.   Neurological: Negative.   Endo/Heme/Allergies: Negative.   Psychiatric/Behavioral: Negative.      Physical Exam  Ht 5\' 5"  (1.651 m)  Wt 102.059 kg (225 lb)  BMI 37.44 kg/m2  LMP 04/09/2015 (Exact Date) Constitutional: She is oriented to person, place, and time. She appears well-developed and well-nourished.  HENT:  Head: Normocephalic and atraumatic.  Nose: Nose normal.  Mouth/Throat:  Oropharynx is clear and moist. No oropharyngeal exudate.  Eyes: Conjunctivae normal and EOM are normal. Pupils are equal, round, and reactive to light. No scleral icterus.  Neck: Normal range of motion. Neck supple. No tracheal deviation present. No thyromegaly present.  Cardiovascular: Normal rate.   Respiratory: Effort normal and breath sounds normal.  GI: Soft. Bowel sounds are normal. She exhibits no distension and no mass. There is no tenderness.  Lymphadenopathy:    She has no cervical adenopathy.  Neurological: She is alert and oriented to person, place, and time. She has normal reflexes.  Skin: Skin is warm.  Psychiatric: She has a normal mood and affect. Her behavior is normal. Judgment and thought content normal.       Assessment/Plan:  Patient will have a laparoscopy, excision of endometriosis and left hydrosalpinx. After recovery patient will have a frozen embryo transfer cycle.

## 2015-05-03 ENCOUNTER — Ambulatory Visit (HOSPITAL_BASED_OUTPATIENT_CLINIC_OR_DEPARTMENT_OTHER)
Admission: RE | Admit: 2015-05-03 | Discharge: 2015-05-03 | Disposition: A | Discharge status: Home / Self Care | Payer: 59 | Source: Ambulatory Visit | Attending: Obstetrics and Gynecology | Admitting: Obstetrics and Gynecology

## 2015-05-03 ENCOUNTER — Ambulatory Visit (HOSPITAL_BASED_OUTPATIENT_CLINIC_OR_DEPARTMENT_OTHER): Payer: 59 | Admitting: Anesthesiology

## 2015-05-03 ENCOUNTER — Encounter (HOSPITAL_BASED_OUTPATIENT_CLINIC_OR_DEPARTMENT_OTHER): Payer: Self-pay

## 2015-05-03 ENCOUNTER — Other Ambulatory Visit: Payer: Self-pay

## 2015-05-03 ENCOUNTER — Encounter (HOSPITAL_BASED_OUTPATIENT_CLINIC_OR_DEPARTMENT_OTHER)
Admission: RE | Disposition: A | Discharge status: Home / Self Care | Payer: Self-pay | Source: Ambulatory Visit | Attending: Obstetrics and Gynecology

## 2015-05-03 DIAGNOSIS — Z6838 Body mass index (BMI) 38.0-38.9, adult: Secondary | ICD-10-CM | POA: Diagnosis not present

## 2015-05-03 DIAGNOSIS — N802 Endometriosis of fallopian tube: Secondary | ICD-10-CM | POA: Insufficient documentation

## 2015-05-03 DIAGNOSIS — N736 Female pelvic peritoneal adhesions (postinfective): Secondary | ICD-10-CM | POA: Diagnosis not present

## 2015-05-03 DIAGNOSIS — N803 Endometriosis of pelvic peritoneum: Secondary | ICD-10-CM | POA: Insufficient documentation

## 2015-05-03 DIAGNOSIS — Z87442 Personal history of urinary calculi: Secondary | ICD-10-CM | POA: Insufficient documentation

## 2015-05-03 DIAGNOSIS — E785 Hyperlipidemia, unspecified: Secondary | ICD-10-CM | POA: Diagnosis not present

## 2015-05-03 DIAGNOSIS — K219 Gastro-esophageal reflux disease without esophagitis: Secondary | ICD-10-CM | POA: Diagnosis not present

## 2015-05-03 DIAGNOSIS — N979 Female infertility, unspecified: Secondary | ICD-10-CM | POA: Diagnosis not present

## 2015-05-03 DIAGNOSIS — N800 Endometriosis of uterus: Secondary | ICD-10-CM | POA: Diagnosis present

## 2015-05-03 DIAGNOSIS — N7011 Chronic salpingitis: Secondary | ICD-10-CM | POA: Insufficient documentation

## 2015-05-03 DIAGNOSIS — J45909 Unspecified asthma, uncomplicated: Secondary | ICD-10-CM | POA: Diagnosis not present

## 2015-05-03 DIAGNOSIS — I10 Essential (primary) hypertension: Secondary | ICD-10-CM | POA: Insufficient documentation

## 2015-05-03 HISTORY — DX: Chronic salpingitis: N70.11

## 2015-05-03 HISTORY — PX: LAPAROSCOPY: SHX197

## 2015-05-03 HISTORY — DX: Endometriosis, unspecified: N80.9

## 2015-05-03 HISTORY — PX: UNILATERAL SALPINGECTOMY: SHX6160

## 2015-05-03 HISTORY — PX: CHROMOPERTUBATION: SHX6288

## 2015-05-03 LAB — POCT I-STAT 4, (NA,K, GLUC, HGB,HCT)
GLUCOSE: 100 mg/dL — AB (ref 65–99)
HCT: 41 % (ref 36.0–46.0)
HEMOGLOBIN: 13.9 g/dL (ref 12.0–15.0)
Potassium: 4.2 mmol/L (ref 3.5–5.1)
Sodium: 141 mmol/L (ref 135–145)

## 2015-05-03 SURGERY — LAPAROSCOPY OPERATIVE
Anesthesia: General | Site: Vagina

## 2015-05-03 MED ORDER — MEPERIDINE HCL 25 MG/ML IJ SOLN
6.2500 mg | INTRAMUSCULAR | Status: DC | PRN
  Filled 2015-05-03: qty 1

## 2015-05-03 MED ORDER — SCOPOLAMINE 1 MG/3DAYS TD PT72
MEDICATED_PATCH | TRANSDERMAL | Status: AC
  Filled 2015-05-03: qty 1

## 2015-05-03 MED ORDER — METOCLOPRAMIDE HCL 5 MG/ML IJ SOLN: INTRAMUSCULAR | Status: DC | PRN

## 2015-05-03 MED ORDER — DEXAMETHASONE SODIUM PHOSPHATE 4 MG/ML IJ SOLN
INTRAMUSCULAR | Status: DC | PRN
  Administered 2015-05-03: 10 mg via INTRAVENOUS

## 2015-05-03 MED ORDER — FENTANYL CITRATE (PF) 100 MCG/2ML IJ SOLN
INTRAMUSCULAR | Status: AC
  Filled 2015-05-03: qty 4

## 2015-05-03 MED ORDER — CEFAZOLIN SODIUM-DEXTROSE 2-3 GM-% IV SOLR
2.0000 g | INTRAVENOUS | Status: DC
  Filled 2015-05-03: qty 50

## 2015-05-03 MED ORDER — LIDOCAINE HCL (CARDIAC) 20 MG/ML IV SOLN
INTRAVENOUS | Status: DC | PRN
  Administered 2015-05-03: 50 mg via INTRAVENOUS

## 2015-05-03 MED ORDER — LACTATED RINGERS IV SOLN
INTRAVENOUS | Status: DC
  Filled 2015-05-03: qty 1000

## 2015-05-03 MED ORDER — TAMSULOSIN HCL 0.4 MG PO CAPS: 0.4000 mg | ORAL_CAPSULE | Freq: Every day | ORAL | Status: DC

## 2015-05-03 MED ORDER — GLYCOPYRROLATE 0.2 MG/ML IJ SOLN
INTRAMUSCULAR | Status: DC | PRN
  Administered 2015-05-03: 0.4 mg via INTRAVENOUS

## 2015-05-03 MED ORDER — EPHEDRINE SULFATE 50 MG/ML IJ SOLN
INTRAMUSCULAR | Status: DC | PRN
  Administered 2015-05-03: 15 mg via INTRAVENOUS

## 2015-05-03 MED ORDER — ONDANSETRON HCL 4 MG/2ML IJ SOLN
INTRAMUSCULAR | Status: DC | PRN
  Administered 2015-05-03: 4 mg via INTRAVENOUS

## 2015-05-03 MED ORDER — LACTATED RINGERS IR SOLN
Status: DC | PRN
  Administered 2015-05-03: 3000 mL

## 2015-05-03 MED ORDER — METHYLENE BLUE 1 % INJ SOLN
INTRAMUSCULAR | Status: DC | PRN
  Administered 2015-05-03: 3 mL via SUBMUCOSAL

## 2015-05-03 MED ORDER — MIDAZOLAM HCL 5 MG/5ML IJ SOLN
INTRAMUSCULAR | Status: DC | PRN
  Administered 2015-05-03: 2 mg via INTRAVENOUS

## 2015-05-03 MED ORDER — FENTANYL CITRATE (PF) 100 MCG/2ML IJ SOLN
INTRAMUSCULAR | Status: DC | PRN
Start: 2015-05-03 — End: 2015-05-03
  Administered 2015-05-03 (×6): 50 ug via INTRAVENOUS

## 2015-05-03 MED ORDER — ROCURONIUM BROMIDE 100 MG/10ML IV SOLN
INTRAVENOUS | Status: DC | PRN
  Administered 2015-05-03 (×2): 10 mg via INTRAVENOUS
  Administered 2015-05-03: 30 mg via INTRAVENOUS

## 2015-05-03 MED ORDER — PROPOFOL 10 MG/ML IV BOLUS
INTRAVENOUS | Status: DC | PRN
  Administered 2015-05-03: 200 mg via INTRAVENOUS

## 2015-05-03 MED ORDER — NEOSTIGMINE METHYLSULFATE 10 MG/10ML IV SOLN
INTRAVENOUS | Status: DC | PRN
  Administered 2015-05-03: 3 mg via INTRAVENOUS

## 2015-05-03 MED ORDER — MIDAZOLAM HCL 2 MG/2ML IJ SOLN
INTRAMUSCULAR | Status: AC
  Filled 2015-05-03: qty 2

## 2015-05-03 MED ORDER — LIDOCAINE HCL 4 % MT SOLN
OROMUCOSAL | Status: DC | PRN
  Administered 2015-05-03: 2 mL via TOPICAL

## 2015-05-03 MED ORDER — OXYCODONE-ACETAMINOPHEN 7.5-325 MG PO TABS: 1.0000 | ORAL_TABLET | ORAL | Status: DC | PRN

## 2015-05-03 MED ORDER — SUCCINYLCHOLINE CHLORIDE 20 MG/ML IJ SOLN
INTRAMUSCULAR | Status: DC | PRN
  Administered 2015-05-03: 100 mg via INTRAVENOUS

## 2015-05-03 MED ORDER — METOCLOPRAMIDE HCL 5 MG/ML IJ SOLN
INTRAMUSCULAR | Status: DC | PRN
  Administered 2015-05-03: 10 mg via INTRAVENOUS

## 2015-05-03 MED ORDER — CEFAZOLIN SODIUM-DEXTROSE 2-3 GM-% IV SOLR
INTRAVENOUS | Status: AC
  Filled 2015-05-03: qty 50

## 2015-05-03 MED ORDER — BUPIVACAINE-EPINEPHRINE 0.25% -1:200000 IJ SOLN
INTRAMUSCULAR | Status: DC | PRN
  Administered 2015-05-03: 10 mL

## 2015-05-03 MED ORDER — PROMETHAZINE HCL 25 MG/ML IJ SOLN
6.2500 mg | INTRAMUSCULAR | Status: DC | PRN
  Filled 2015-05-03: qty 1

## 2015-05-03 MED ORDER — ACETAMINOPHEN 10 MG/ML IV SOLN
INTRAVENOUS | Status: DC | PRN
  Administered 2015-05-03: 1000 mg via INTRAVENOUS

## 2015-05-03 MED ORDER — OXYCODONE-ACETAMINOPHEN 5-325 MG PO TABS
1.0000 | ORAL_TABLET | ORAL | Status: DC | PRN
  Administered 2015-05-03: 1 via ORAL
  Filled 2015-05-03: qty 1

## 2015-05-03 MED ORDER — FENTANYL CITRATE (PF) 100 MCG/2ML IJ SOLN
INTRAMUSCULAR | Status: AC
  Filled 2015-05-03: qty 2

## 2015-05-03 MED ORDER — SCOPOLAMINE 1 MG/3DAYS TD PT72
1.0000 | MEDICATED_PATCH | Freq: Once | TRANSDERMAL | Status: DC
  Administered 2015-05-03: 1.5 mg via TRANSDERMAL
  Filled 2015-05-03: qty 1

## 2015-05-03 MED ORDER — LACTATED RINGERS IV SOLN
INTRAVENOUS | Status: DC
  Administered 2015-05-03 (×3): via INTRAVENOUS
  Filled 2015-05-03: qty 1000

## 2015-05-03 MED ORDER — CEFAZOLIN SODIUM-DEXTROSE 2-3 GM-% IV SOLR
2.0000 g | INTRAVENOUS | Status: AC
  Administered 2015-05-03: 2 g via INTRAVENOUS
  Filled 2015-05-03: qty 50

## 2015-05-03 MED ORDER — OXYCODONE-ACETAMINOPHEN 5-325 MG PO TABS
ORAL_TABLET | ORAL | Status: AC
  Filled 2015-05-03: qty 1

## 2015-05-03 MED ORDER — FENTANYL CITRATE (PF) 100 MCG/2ML IJ SOLN
25.0000 ug | INTRAMUSCULAR | Status: DC | PRN
  Administered 2015-05-03 (×2): 25 ug via INTRAVENOUS
  Filled 2015-05-03: qty 1

## 2015-05-03 MED ORDER — ONDANSETRON HCL 4 MG PO TABS: 4.0000 mg | ORAL_TABLET | Freq: Three times a day (TID) | ORAL | Status: DC | PRN

## 2015-05-03 MED ORDER — KETOROLAC TROMETHAMINE 30 MG/ML IJ SOLN
INTRAMUSCULAR | Status: DC | PRN
  Administered 2015-05-03: 30 mg via INTRAVENOUS

## 2015-05-03 SURGICAL SUPPLY — 69 items
BAG SPEC RTRVL LRG 6X4 10 (ENDOMECHANICALS)
BAG URINE DRAINAGE (UROLOGICAL SUPPLIES) ×4 IMPLANT
BLADE SURG 11 STRL SS (BLADE) ×4 IMPLANT
BRR ADH 6X5 SEPRAFILM 1 SHT (MISCELLANEOUS) ×6
CATH FOLEY 2WAY SLVR  5CC 14FR (CATHETERS) ×1
CATH FOLEY 2WAY SLVR 5CC 14FR (CATHETERS) ×3 IMPLANT
CATH ROBINSON RED A/P 16FR (CATHETERS) ×1 IMPLANT
CONT SPEC 4OZ CLIKSEAL STRL BL (MISCELLANEOUS) ×1 IMPLANT
COVER MAYO STAND STRL (DRAPES) ×4 IMPLANT
DEVICE TROCAR PUNCTURE CLOSURE (ENDOMECHANICALS) IMPLANT
DRAPE UNDERBUTTOCKS STRL (DRAPE) ×4 IMPLANT
DRSG OPSITE POSTOP 3X4 (GAUZE/BANDAGES/DRESSINGS) IMPLANT
ELECT NDL TIP 2.8 STRL (NEEDLE) IMPLANT
ELECT NEEDLE TIP 2.8 STRL (NEEDLE) IMPLANT
ELECT REM PT RETURN 9FT ADLT (ELECTROSURGICAL) ×4
ELECTRODE REM PT RTRN 9FT ADLT (ELECTROSURGICAL) ×3 IMPLANT
EVACUATOR SMOKE 8.L (FILTER) IMPLANT
GLOVE BIO SURGEON STRL SZ7 (GLOVE) ×1 IMPLANT
GLOVE BIO SURGEON STRL SZ8 (GLOVE) ×5 IMPLANT
GLOVE BIOGEL PI IND STRL 8.5 (GLOVE) ×3 IMPLANT
GLOVE BIOGEL PI INDICATOR 8.5 (GLOVE) ×1
GOWN STRL REUS W/ TWL LRG LVL3 (GOWN DISPOSABLE) ×6 IMPLANT
GOWN STRL REUS W/TWL LRG LVL3 (GOWN DISPOSABLE) ×4
GOWN STRL REUS W/TWL XL LVL3 (GOWN DISPOSABLE) ×3 IMPLANT
HOLDER FOLEY CATH W/STRAP (MISCELLANEOUS) ×4 IMPLANT
LIQUID BAND (GAUZE/BANDAGES/DRESSINGS) IMPLANT
MANIFOLD NEPTUNE II (INSTRUMENTS) IMPLANT
MANIPULATOR UTERINE 4.5 ZUMI (MISCELLANEOUS) ×4 IMPLANT
NDL HYPO 25X1 1.5 SAFETY (NEEDLE) ×3 IMPLANT
NDL INSUFFLATION 14GA 120MM (NEEDLE) ×3 IMPLANT
NEEDLE HYPO 25X1 1.5 SAFETY (NEEDLE) ×4 IMPLANT
NEEDLE INSUFFLATION 14GA 120MM (NEEDLE) ×4 IMPLANT
NS IRRIG 500ML POUR BTL (IV SOLUTION) ×4 IMPLANT
PACK BASIN DAY SURGERY FS (CUSTOM PROCEDURE TRAY) ×4 IMPLANT
PACK LAPAROSCOPY II (CUSTOM PROCEDURE TRAY) ×4 IMPLANT
PAD OB MATERNITY 4.3X12.25 (PERSONAL CARE ITEMS) ×4 IMPLANT
PADDING ION DISPOSABLE (MISCELLANEOUS) ×4 IMPLANT
PENCIL BUTTON HOLSTER BLD 10FT (ELECTRODE) IMPLANT
POUCH SPECIMEN RETRIEVAL 10MM (ENDOMECHANICALS) IMPLANT
SCALPEL HARMONIC ACE (MISCELLANEOUS) ×1 IMPLANT
SEALER TISSUE G2 CVD JAW 35 (ENDOMECHANICALS) IMPLANT
SEALER TISSUE G2 CVD JAW 45CM (ENDOMECHANICALS) IMPLANT
SEPRAFILM MEMBRANE 5X6 (MISCELLANEOUS) ×2 IMPLANT
SET IRRIG TUBING LAPAROSCOPIC (IRRIGATION / IRRIGATOR) ×4 IMPLANT
SOLUTION ANTI FOG 6CC (MISCELLANEOUS) ×4 IMPLANT
STRIP CLOSURE SKIN 1/2X4 (GAUZE/BANDAGES/DRESSINGS) ×1 IMPLANT
SUT MNCRL AB 4-0 PS2 18 (SUTURE) ×4 IMPLANT
SUT PROLENE 0 CT 1 30 (SUTURE) IMPLANT
SUT VIC AB 2-0 CT1 27 (SUTURE)
SUT VIC AB 2-0 CT1 TAPERPNT 27 (SUTURE) IMPLANT
SUT VIC AB 2-0 UR6 27 (SUTURE) IMPLANT
SUT VICRYL 0 TIES 12 18 (SUTURE) IMPLANT
SYR 20CC LL (SYRINGE) IMPLANT
SYR 30ML LL (SYRINGE) IMPLANT
SYR 3ML 23GX1 SAFETY (SYRINGE) ×1 IMPLANT
SYR 50ML LL SCALE MARK (SYRINGE) ×1 IMPLANT
SYR 5ML LL (SYRINGE) ×4 IMPLANT
SYR CONTROL 10ML LL (SYRINGE) ×6 IMPLANT
SYRINGE 12CC LL (MISCELLANEOUS) ×4 IMPLANT
SYS LAPSCP GELPORT 120MM (MISCELLANEOUS)
SYSTEM LAPSCP GELPORT 120MM (MISCELLANEOUS) IMPLANT
TOWEL OR 17X24 6PK STRL BLUE (TOWEL DISPOSABLE) ×8 IMPLANT
TRAY DSU PREP LF (CUSTOM PROCEDURE TRAY) ×4 IMPLANT
TROCAR OPTI TIP 5M 100M (ENDOMECHANICALS) ×4 IMPLANT
TROCAR XCEL DIL TIP R 11M (ENDOMECHANICALS) IMPLANT
TROCAR XCEL NON-BLD 5MMX100MML (ENDOMECHANICALS) ×2 IMPLANT
TUBING INSUFFLATION 10FT LAP (TUBING) ×4 IMPLANT
WARMER LAPAROSCOPE (MISCELLANEOUS) ×4 IMPLANT
WATER STERILE IRR 500ML POUR (IV SOLUTION) ×4 IMPLANT

## 2015-05-03 NOTE — Anesthesia Procedure Notes (Signed)
Procedure Name: Intubation Date/Time: 05/03/2015 9:40 AM Performed by: Mechele Claude Pre-anesthesia Checklist: Patient identified, Emergency Drugs available, Suction available and Patient being monitored Patient Re-evaluated:Patient Re-evaluated prior to inductionOxygen Delivery Method: Circle System Utilized Preoxygenation: Pre-oxygenation with 100% oxygen Intubation Type: IV induction Ventilation: Mask ventilation without difficulty Laryngoscope Size: Mac and 3 Grade View: Grade I Tube type: Oral Tube size: 7.0 mm Number of attempts: 1 Airway Equipment and Method: Stylet and LTA kit utilized Placement Confirmation: ETT inserted through vocal cords under direct vision,  positive ETCO2 and breath sounds checked- equal and bilateral Secured at: 21 cm Tube secured with: Tape Dental Injury: Teeth and Oropharynx as per pre-operative assessment

## 2015-05-03 NOTE — Transfer of Care (Signed)
Immediate Anesthesia Transfer of Care Note  Patient: Rachel Hooper  Procedure(s) Performed: Procedure(s): LAPAROSCOPY, EXCISION ENDOMETRIOSIS, ENTERO LYSIS, WITH ENDOMETRIAL BIOPSY (N/A) LEFT SALPINGECTOMY (Left) CHROMOPERTUBATION (N/A)  Patient Location: PACU  Anesthesia Type:General  Level of Consciousness: awake, alert , oriented and patient cooperative  Airway & Oxygen Therapy: Patient Spontanous Breathing and Patient connected to face mask oxygen  Post-op Assessment: Report given to RN and Post -op Vital signs reviewed and stable  Post vital signs: Reviewed and stable  Last Vitals:  Filed Vitals:   05/03/15 0733  BP: 109/95  Pulse: 89  Temp: 37.2 C  Resp: 16    Complications: No apparent anesthesia complications

## 2015-05-03 NOTE — Anesthesia Postprocedure Evaluation (Signed)
  Anesthesia Post-op Note  Patient: Rachel Hooper  Procedure(s) Performed: Procedure(s) (LRB): LAPAROSCOPY, EXCISION ENDOMETRIOSIS, ENTERO LYSIS, WITH ENDOMETRIAL BIOPSY (N/A) LEFT SALPINGECTOMY (Left) CHROMOPERTUBATION (N/A)  Patient Location: PACU  Anesthesia Type: General  Level of Consciousness: awake and alert   Airway and Oxygen Therapy: Patient Spontanous Breathing  Post-op Pain: mild  Post-op Assessment: Post-op Vital signs reviewed, Patient's Cardiovascular Status Stable, Respiratory Function Stable, Patent Airway and No signs of Nausea or vomiting  Last Vitals:  Filed Vitals:   05/03/15 1305  BP: 134/62  Pulse: 84  Temp:   Resp: 16    Post-op Vital Signs: stable   Complications: No apparent anesthesia complications

## 2015-05-03 NOTE — Discharge Instructions (Signed)
HOME CARE INSTRUCTIONS - LAPAROSCOPY  Wound Care: The bandaids or dressing which are placed over the skin openings may be removed the day after surgery. The incision should be kept clean and dry. The stitches do not need to be removed. Should the incision become sore, red, and swollen after the first week, check with your doctor.  Personal Hygiene: Shower the day after your procedure. Always wipe from front to back after elimination.   Activity: Do not drive or operate any equipment today. The effects of the anesthesia are still present and drowsiness may result. Rest today, not necessarily flat bed rest, just take it easy. You may resume your normal activity in one to three days or as instructed by your physician.  Sexual Activity: You resume sexual activity as indicated by your physician_________. If your laparoscopy was for a sterilization ( tubes tied ), continue current method of birth control until after your next period or ask for specific instructions from your doctor.  Diet: Eat a light diet as desired this evening. You may resume a regular diet tomorrow.  Return to Work: Two to three days or as indicated by your doctor.  Expectations After Surgery: Your surgery will cause vaginal drainage or spotting which may continue for 2-3 days. Mild abdominal discomfort or tenderness is not unusual and some shoulder pain may also be noted which can be relieved by lying flat in pain.  Call Your Doctor If these Occur:  Persistent or heavy bleeding at incision site       Redness or swelling around incision       Elevation of temperature greater than 100 degrees F  Call for follow-up appointment _____________.    Post Anesthesia Home Care Instructions  Activity: Get plenty of rest for the remainder of the day. A responsible adult should stay with you for 24 hours following the procedure.  For the next 24 hours, DO NOT: -Drive a car -Paediatric nurse -Drink alcoholic beverages -Take any  medication unless instructed by your physician -Make any legal decisions or sign important papers.  Meals: Start with liquid foods such as gelatin or soup. Progress to regular foods as tolerated. Avoid greasy, spicy, heavy foods. If nausea and/or vomiting occur, drink only clear liquids until the nausea and/or vomiting subsides. Call your physician if vomiting continues.  Special Instructions/Symptoms: Your throat may feel dry or sore from the anesthesia or the breathing tube placed in your throat during surgery. If this causes discomfort, gargle with warm salt water. The discomfort should disappear within 24 hours.  If you had a scopolamine patch placed behind your ear for the management of post- operative nausea and/or vomiting:  1. The medication in the patch is effective for 72 hours, after which it should be removed.  Wrap patch in a tissue and discard in the trash. Wash hands thoroughly with soap and water. 2. You may remove the patch earlier than 72 hours if you experience unpleasant side effects which may include dry mouth, dizziness or visual disturbances. 3. Avoid touching the patch. Wash your hands with soap and water after contact with the patch.   ation on caring for yourself after your procedure. Your caregiver may also give you more specific instructions. Your treatment has been planned according to current medical practices, but problems sometimes occur. Call your caregiver if you have any problems or questions after your procedure.Will watch your condition.  Will get help right away if you are not doing well or get worse. Document Released:  10/07/2005 Document Revised: 12/30/2011 Document Reviewed: 04/16/2011 ExitCare Patient Information 2015 [place]. This information is not intended to replace advice given to you by your health care provider. Make sure you discuss any questions you have with your health care provider.

## 2015-05-03 NOTE — Op Note (Signed)
Operative Note  Preoperative diagnosis: Stage IV endometriosis of uterus, tubes, ovaries, posterior cul-de-sac, and peritoneum, left hydrosalpinx, pelvic adhesions, infertility  Postoperative diagnosis:Stage IV endometriosis of uterus, tubes, ovaries, posterior cul-de-sac, and peritoneum, left hydrosalpinx, pelvic adhesions, infertility  Procedure: Laparoscopy, lysis of adhesions, enterolysis, left salpingectomy, excision of peritoneal lesions, chromotubation, endometrial biopsy  Anesthesia: Gen. endotracheal  Complications: None  Estimated blood loss: 100 cc  Specimens: Endometrial biopsy and left fallopian tube to pathology  Findings:  On exam under anesthesia, external genitalia, Bartholin's, Skene's, urethra were normal. The cervix appeared grossly normal. The uterus was midline, deviated to the left, top of normal size, and immobile. There were no adnexal masses. There was no induration or nodularity of the posterior fornix.  On laparoscopy, upper abdomen, liver surface and diaphragm surfaces were normal. Gallbladder appeared adherent to the liver.  The appendix was not visualized.  The bladder was advanced over the uterus, almost halfway up to the fundus and densely adherent to it. The parietal peritoneum which was advanced contained deep lesions of endometriosis laterally this was almost completely freed up with needle electrode. Posterior cul-de-sac was completely obliterated with dense and coherent adhesions, such that on the uterine fundus could be visualized as well as the distal half of the right fallopian tube and her right ovary. The right ovary contained multiple corpora lutea. The fimbria of the right tube were rated as 5 out of 5 tube was patent, however proximal portion was shortened at the region of the right uterotubal junction where there were dense adhesions from the round ligament and right ovary as well as the epiploic of the rectosigmoid. The right ovary was 50% covered  with dense adhesions to the pelvic sidewall and to the rectosigmoid and the uterus. The epiploic of the sigmoid was completely encasing the left adnexa which was further covered with adhesions from the left round ligament. After separation of the epiploic adhesions, the left hydrosalpinx could be noted. It showed a dilation about 3 cm. Part of the left tube was retroperitonealized due to adhesions. The medial aspect was completely adherent to the left ovary which was enlarged with corpora lutea. The left ovary was 80% encased with dense adhesions to the uterus, left tube and pelvic sidewall and the rectosigmoid.  Description of the procedure: The patient was placed in dorsal supine position and general endotracheal anesthesia was given. 2 g of cefazolin were given intravenously for prophylaxis. Patient was placed in lithotomy position. She was prepped and draped inside manner. A Foley catheter was inserted into the bladder. The uterus sounded to 8 cm. A serrated sharp curet could be inserted into the uterus and an endometrial biopsy and scratching was done. A ZUMI catheter was placed into the uterine cavity.  It was attached to a syringe containing dilute methylene blue solution in order to do chromotubation during the procedure. The surgeon was regloved and a surgical field was created on the abdomen.  After preemptive anesthesia of all surgical sites with 0.25% bupivacaine with 1 200,000 epinephrine, a 5 mm intraumbilical skin incision was made and a Verress needle was inserted. Its correct location was confirmed. A pneumoperitoneum was created with carbon dioxide.  5 mm laparoscope with a 30 lens was inserted and video laparoscopy was started . A left lower quadrant 5 mm and a right lower quadrant  5 mm incisions were made and ancillary trochars were placed under direct visualization. Above findings were noted. Chromotubation confirmed the distal obstruction of the left tube and patency  of the right  fallopian tube. Using a needle electrode on 75 W of cutting current, the epiploic adhesions to the adnexa and to the round ligaments were lysed. The advancement of the bladder peritoneum to the uterus and the folding of the bladder was managed by lysis of adhesions with sharp scissors and needle electrode. The adhesions to the right ovary and to the left adnexa were completely taken down. The retroperitoneal space was entered in order to free up the end visualized lateral aspect of the left hydrosalpinx.   We then freed up the cul-de-sac almost 80%, using sharp scissors dissection and obtaining hemostasis with needle electrode. These adhesions were noted to be cohesive nature and are likely to recur. The harmonic Ace was used to sever the left tube at the 2 uterotubal junction. Tube was then enter a greatly clamped coagulated and cut until the distal end was reached. At this point the fimbria could be uncovered and the rest of the tube was removed and submitted to pathology. No further attempt was made to free up the posterior cul-de-sac adhesions. The pelvis was copiously irrigated and aspirated. Hemostasis was insured. A slurry of 2 sheets of Seprafilm in 60 mL of lactated Ringer solution was instilled into the pelvis in an attempt to lower the risk of adhesion formation. The gas was allowed to escape, and the instruments were removed. Incisions were approximated with 4-0 Monocryl in subcuticular stitches. Steri-Strips were applied. The patient tolerated the procedure well and was transferred to recovery room in satisfactory condition. Plan: Patient will stay on endometriosis suppression with Femara 2.5 mg and Aygestin 2.5 mg twice a day. She will stop these as of 05/24/2015 and we'll start estrogen replacement treatment in preparation for ART procedure.   Governor Specking

## 2015-05-03 NOTE — Anesthesia Preprocedure Evaluation (Addendum)
Anesthesia Evaluation  Patient identified by MRN, date of birth, ID band Patient awake    Reviewed: Allergy & Precautions, NPO status , Patient's Chart, lab work & pertinent test results, reviewed documented beta blocker date and time   History of Anesthesia Complications (+) PONV  Airway Mallampati: II   Neck ROM: Full    Dental  (+) Teeth Intact, Dental Advisory Given   Pulmonary asthma ,  breath sounds clear to auscultation        Cardiovascular hypertension, Pt. on medications Rhythm:Regular     Neuro/Psych negative neurological ROS  negative psych ROS   GI/Hepatic Neg liver ROS, GERD-  Medicated,  Endo/Other  negative endocrine ROSMorbid obesity  Renal/GU negative Renal ROS  negative genitourinary   Musculoskeletal negative musculoskeletal ROS (+)   Abdominal (+)  Abdomen: soft.    Peds negative pediatric ROS (+)  Hematology negative hematology ROS (+)   Anesthesia Other Findings   Reproductive/Obstetrics negative OB ROS                           Anesthesia Physical  Anesthesia Plan  ASA: II  Anesthesia Plan: General   Post-op Pain Management:    Induction: Intravenous  Airway Management Planned: Oral ETT  Additional Equipment:   Intra-op Plan:   Post-operative Plan: Extubation in OR  Informed Consent: I have reviewed the patients History and Physical, chart, labs and discussed the procedure including the risks, benefits and alternatives for the proposed anesthesia with the patient or authorized representative who has indicated his/her understanding and acceptance.     Plan Discussed with:   Anesthesia Plan Comments:         Anesthesia Quick Evaluation

## 2015-05-08 ENCOUNTER — Encounter (HOSPITAL_BASED_OUTPATIENT_CLINIC_OR_DEPARTMENT_OTHER): Payer: Self-pay | Admitting: Obstetrics and Gynecology

## 2015-07-01 DIAGNOSIS — Z91038 Other insect allergy status: Secondary | ICD-10-CM | POA: Insufficient documentation

## 2015-07-01 DIAGNOSIS — Z91030 Bee allergy status: Secondary | ICD-10-CM | POA: Insufficient documentation

## 2015-08-09 ENCOUNTER — Telehealth: Payer: Self-pay

## 2015-08-09 NOTE — Telephone Encounter (Signed)
If worried about side effects, I would replace Asmanex with Pulmicort and stop the Singulair

## 2015-08-09 NOTE — Telephone Encounter (Signed)
Pt is pregnant. Wants advice on 2 RX : Singulair and Asmanex.  Her OBGYN has cleared them but she wants Dr. Shaune Leeks' advice b/c she has read that there have been some birth defects related to consuming these while pregnant. Pls advise.

## 2015-08-09 NOTE — Telephone Encounter (Signed)
Spoke to patient and informed her that we could change her inhaler to Pulmicort.  She wanted to know if the Pulmicort would be better than the Flovent.  She stated that because her insurance would not pay for Asmanex it was switched to Flovent.  She stated that if if the Pulmicort is not better than the Flovent she would continue using the Asmanex.

## 2015-08-09 NOTE — Telephone Encounter (Signed)
Pt called back and wanted to add 2 more specialists that have cleared these meds.  An RE and a Maternal Fetal Medicine Spec. She is very nervous with medications and pregnancy

## 2015-08-14 NOTE — Telephone Encounter (Signed)
Patient called back and stated that she would like to try the pulmicort and she asked if she could get samples as well.  Please advise what strength and how you would like for her to start with.

## 2015-08-15 NOTE — Telephone Encounter (Signed)
Pulmicort 180- 2 puffs once a day to prevent asthma May give sample

## 2015-08-15 NOTE — Telephone Encounter (Signed)
Spoke with pt she will pick up her sample of pulmicort 180  from the [facility]

## 2015-08-30 ENCOUNTER — Ambulatory Visit (INDEPENDENT_AMBULATORY_CARE_PROVIDER_SITE_OTHER): Payer: 59 | Admitting: Neurology

## 2015-08-30 DIAGNOSIS — T63441D Toxic effect of venom of bees, accidental (unintentional), subsequent encounter: Secondary | ICD-10-CM | POA: Diagnosis not present

## 2015-10-23 ENCOUNTER — Other Ambulatory Visit: Payer: Self-pay | Admitting: Pediatrics

## 2015-10-24 ENCOUNTER — Other Ambulatory Visit: Payer: Self-pay | Admitting: Allergy

## 2015-10-24 MED ORDER — MONTELUKAST SODIUM 10 MG PO TABS: 10.0000 mg | ORAL_TABLET | Freq: Every day | ORAL | Status: DC

## 2015-10-24 MED FILL — MONTELUKAST SOD 10 MG TAB: 10 | 90 days supply | Qty: 90 | Fill #0

## 2015-10-27 ENCOUNTER — Ambulatory Visit (INDEPENDENT_AMBULATORY_CARE_PROVIDER_SITE_OTHER): Payer: 59 | Admitting: *Deleted

## 2015-10-27 DIAGNOSIS — T63441D Toxic effect of venom of bees, accidental (unintentional), subsequent encounter: Secondary | ICD-10-CM

## 2015-10-30 DIAGNOSIS — N802 Endometriosis of fallopian tube: Secondary | ICD-10-CM | POA: Diagnosis not present

## 2015-10-30 DIAGNOSIS — E288 Other ovarian dysfunction: Secondary | ICD-10-CM | POA: Diagnosis not present

## 2015-10-30 DIAGNOSIS — N736 Female pelvic peritoneal adhesions (postinfective): Secondary | ICD-10-CM | POA: Diagnosis not present

## 2015-10-30 DIAGNOSIS — Z3183 Encounter for assisted reproductive fertility procedure cycle: Secondary | ICD-10-CM | POA: Diagnosis not present

## 2015-10-30 DIAGNOSIS — N803 Endometriosis of pelvic peritoneum: Secondary | ICD-10-CM | POA: Diagnosis not present

## 2015-10-30 MED FILL — CETROTIDE 0.25 MG KIT: 0.25 | 3 days supply | Qty: 3 | Fill #0

## 2015-11-03 DIAGNOSIS — E288 Other ovarian dysfunction: Secondary | ICD-10-CM | POA: Diagnosis not present

## 2015-11-07 MED FILL — diazePAM 10 MG TABS: 10 | 1 days supply | Qty: 1 | Fill #1

## 2015-11-07 MED FILL — TARON-C DHA CAPSULE: 53.5-38-1 | 30 days supply | Qty: 30 | Fill #3

## 2015-11-08 MED FILL — diazePAM 10 MG TABS: 10 | 1 days supply | Qty: 1 | Fill #0

## 2015-11-08 MED FILL — OXYCODONE/APAP 5-325: 5-325 | 3 days supply | Qty: 10 | Fill #0

## 2015-11-10 DIAGNOSIS — N803 Endometriosis of pelvic peritoneum: Secondary | ICD-10-CM | POA: Diagnosis not present

## 2015-11-10 DIAGNOSIS — Z3183 Encounter for assisted reproductive fertility procedure cycle: Secondary | ICD-10-CM | POA: Diagnosis not present

## 2015-11-10 DIAGNOSIS — N736 Female pelvic peritoneal adhesions (postinfective): Secondary | ICD-10-CM | POA: Diagnosis not present

## 2015-11-10 DIAGNOSIS — Z319 Encounter for procreative management, unspecified: Secondary | ICD-10-CM | POA: Diagnosis not present

## 2015-11-13 MED FILL — ESTRADIOL 0.1 MG PATCH: 0.1 | 24 days supply | Qty: 8 | Fill #0

## 2015-11-14 MED FILL — metFORMIN HCL 500 MG TABS: 500 | 30 days supply | Qty: 60 | Fill #1

## 2015-11-20 DIAGNOSIS — Z3200 Encounter for pregnancy test, result unknown: Secondary | ICD-10-CM | POA: Diagnosis not present

## 2015-12-04 DIAGNOSIS — Z319 Encounter for procreative management, unspecified: Secondary | ICD-10-CM | POA: Diagnosis not present

## 2015-12-04 DIAGNOSIS — N736 Female pelvic peritoneal adhesions (postinfective): Secondary | ICD-10-CM | POA: Diagnosis not present

## 2015-12-04 DIAGNOSIS — Z3141 Encounter for fertility testing: Secondary | ICD-10-CM | POA: Diagnosis not present

## 2015-12-04 DIAGNOSIS — N803 Endometriosis of pelvic peritoneum: Secondary | ICD-10-CM | POA: Diagnosis not present

## 2015-12-04 DIAGNOSIS — E288 Other ovarian dysfunction: Secondary | ICD-10-CM | POA: Diagnosis not present

## 2015-12-04 MED FILL — ESTRADIOL 0.1 MG PATCH: 0.1 | 24 days supply | Qty: 8 | Fill #0

## 2015-12-04 MED FILL — METOPROLOL SUCC ER 100 MG T: 100 | 90 days supply | Qty: 90 | Fill #1

## 2015-12-04 MED FILL — ESTRADIOL 2 MG TABLET: 2 | 30 days supply | Qty: 60 | Fill #1

## 2015-12-04 MED FILL — LETROZOLE 2.5 MG TABLET: 2.5 | 30 days supply | Qty: 30 | Fill #1

## 2015-12-06 DIAGNOSIS — Z319 Encounter for procreative management, unspecified: Secondary | ICD-10-CM | POA: Diagnosis not present

## 2015-12-14 MED FILL — diazePAM 10 MG TABS: 10 | 1 days supply | Qty: 1 | Fill #0

## 2015-12-14 MED FILL — POTASSIUM CITRATE ER 10 MEQ: 10 MEQ | 30 days supply | Qty: 120 | Fill #2

## 2015-12-15 DIAGNOSIS — N736 Female pelvic peritoneal adhesions (postinfective): Secondary | ICD-10-CM | POA: Diagnosis not present

## 2015-12-15 DIAGNOSIS — Z319 Encounter for procreative management, unspecified: Secondary | ICD-10-CM | POA: Diagnosis not present

## 2015-12-15 DIAGNOSIS — Z3141 Encounter for fertility testing: Secondary | ICD-10-CM | POA: Diagnosis not present

## 2015-12-15 DIAGNOSIS — N803 Endometriosis of pelvic peritoneum: Secondary | ICD-10-CM | POA: Diagnosis not present

## 2015-12-21 MED FILL — ESTRADIOL 0.1 MG PATCH: 0.1 | 24 days supply | Qty: 8 | Fill #1

## 2015-12-26 ENCOUNTER — Ambulatory Visit (INDEPENDENT_AMBULATORY_CARE_PROVIDER_SITE_OTHER): Payer: 59

## 2015-12-26 DIAGNOSIS — T63441D Toxic effect of venom of bees, accidental (unintentional), subsequent encounter: Secondary | ICD-10-CM | POA: Diagnosis not present

## 2016-01-02 DIAGNOSIS — Z319 Encounter for procreative management, unspecified: Secondary | ICD-10-CM | POA: Diagnosis not present

## 2016-01-02 DIAGNOSIS — E288 Other ovarian dysfunction: Secondary | ICD-10-CM | POA: Diagnosis not present

## 2016-01-02 DIAGNOSIS — N736 Female pelvic peritoneal adhesions (postinfective): Secondary | ICD-10-CM | POA: Diagnosis not present

## 2016-01-02 DIAGNOSIS — Z3183 Encounter for assisted reproductive fertility procedure cycle: Secondary | ICD-10-CM | POA: Diagnosis not present

## 2016-01-02 DIAGNOSIS — N803 Endometriosis of pelvic peritoneum: Secondary | ICD-10-CM | POA: Diagnosis not present

## 2016-01-06 DIAGNOSIS — Z3183 Encounter for assisted reproductive fertility procedure cycle: Secondary | ICD-10-CM | POA: Diagnosis not present

## 2016-01-06 DIAGNOSIS — N736 Female pelvic peritoneal adhesions (postinfective): Secondary | ICD-10-CM | POA: Diagnosis not present

## 2016-01-06 DIAGNOSIS — N803 Endometriosis of pelvic peritoneum: Secondary | ICD-10-CM | POA: Diagnosis not present

## 2016-01-06 DIAGNOSIS — Z319 Encounter for procreative management, unspecified: Secondary | ICD-10-CM | POA: Diagnosis not present

## 2016-01-09 MED FILL — BD NEEDLES 22GX1.5: 22G X 1-1/2 | 30 days supply | Qty: 30 | Fill #0

## 2016-01-09 MED FILL — ESTRADIOL 2 MG TABLET: 2 | 30 days supply | Qty: 60 | Fill #0

## 2016-01-09 MED FILL — PROGESTERONE OIL 50 MG/ML V: 50 | 30 days supply | Qty: 30 | Fill #0

## 2016-01-10 MED FILL — METHYLPREDNISOLONE 4 MG TAB: 4 | 4 days supply | Qty: 16 | Fill #0

## 2016-01-10 MED FILL — MONTELUKAST SOD 10 MG TAB: 10 | 90 days supply | Qty: 90 | Fill #1

## 2016-01-10 MED FILL — OXYCODONE/APAP 5-325: 5-325 | 1 days supply | Qty: 5 | Fill #0

## 2016-01-10 MED FILL — ESTRADIOL 0.1 MG PATCH: 0.1 | 24 days supply | Qty: 8 | Fill #2

## 2016-01-10 MED FILL — diazePAM 10 MG TABS: 10 | 1 days supply | Qty: 1 | Fill #0

## 2016-01-12 DIAGNOSIS — N803 Endometriosis of pelvic peritoneum: Secondary | ICD-10-CM | POA: Diagnosis not present

## 2016-01-12 DIAGNOSIS — Z3183 Encounter for assisted reproductive fertility procedure cycle: Secondary | ICD-10-CM | POA: Diagnosis not present

## 2016-01-12 DIAGNOSIS — Z319 Encounter for procreative management, unspecified: Secondary | ICD-10-CM | POA: Diagnosis not present

## 2016-01-12 DIAGNOSIS — N736 Female pelvic peritoneal adhesions (postinfective): Secondary | ICD-10-CM | POA: Diagnosis not present

## 2016-01-12 MED FILL — ESTRADIOL 2 MG TABLET: 2 | 30 days supply | Qty: 60 | Fill #0

## 2016-01-12 MED FILL — NITROFURANTOIN MONO-MCR 100: 100 | 7 days supply | Qty: 14 | Fill #0

## 2016-01-22 DIAGNOSIS — Z3200 Encounter for pregnancy test, result unknown: Secondary | ICD-10-CM | POA: Diagnosis not present

## 2016-01-25 MED FILL — GONAL-F 1,050 UNITS VIAL: 1050 | 30 days supply | Qty: 2 | Fill #1

## 2016-01-25 MED FILL — MENOPUR 75 UNIT VIAL: 75 | 30 days supply | Qty: 10 | Fill #1

## 2016-01-26 DIAGNOSIS — Z319 Encounter for procreative management, unspecified: Secondary | ICD-10-CM | POA: Diagnosis not present

## 2016-01-26 DIAGNOSIS — E288 Other ovarian dysfunction: Secondary | ICD-10-CM | POA: Diagnosis not present

## 2016-01-26 DIAGNOSIS — Z113 Encounter for screening for infections with a predominantly sexual mode of transmission: Secondary | ICD-10-CM | POA: Diagnosis not present

## 2016-01-26 DIAGNOSIS — I10 Essential (primary) hypertension: Secondary | ICD-10-CM | POA: Diagnosis not present

## 2016-01-26 DIAGNOSIS — E781 Pure hyperglyceridemia: Secondary | ICD-10-CM | POA: Diagnosis not present

## 2016-01-26 DIAGNOSIS — E7800 Pure hypercholesterolemia, unspecified: Secondary | ICD-10-CM | POA: Diagnosis not present

## 2016-01-26 DIAGNOSIS — K219 Gastro-esophageal reflux disease without esophagitis: Secondary | ICD-10-CM | POA: Diagnosis not present

## 2016-01-26 MED FILL — OMEPRAZOLE DR 20 MG CAPSULE: 20 | 90 days supply | Qty: 90 | Fill #0

## 2016-01-26 MED FILL — NORETHINDRONE 5 MG TABLET: 5 | 10 days supply | Qty: 10 | Fill #0

## 2016-02-05 DIAGNOSIS — E288 Other ovarian dysfunction: Secondary | ICD-10-CM | POA: Diagnosis not present

## 2016-02-05 DIAGNOSIS — Z3183 Encounter for assisted reproductive fertility procedure cycle: Secondary | ICD-10-CM | POA: Diagnosis not present

## 2016-02-05 MED FILL — TARON-C DHA CAPSULE: 53.5-38-1 | 30 days supply | Qty: 30 | Fill #4

## 2016-02-05 MED FILL — metFORMIN HCL 500 MG TABS: 500 | 30 days supply | Qty: 60 | Fill #2

## 2016-02-06 MED FILL — PREGNYL 10,000 UNITS VIAL: 10000 | 1 days supply | Qty: 1 | Fill #0

## 2016-02-06 MED FILL — DOXYCYCLINE HYCLATE 100 MG: 100 | 20 days supply | Qty: 40 | Fill #0

## 2016-02-07 MED FILL — CETROTIDE 0.25 MG KIT: 0.25 | 6 days supply | Qty: 6 | Fill #0

## 2016-02-07 MED FILL — BD NEEDLES 30GX0.5: 30G X 1/2" | 20 days supply | Qty: 20 | Fill #1

## 2016-02-09 MED FILL — POTASSIUM CITRATE ER 10 MEQ: 10 MEQ | 30 days supply | Qty: 120 | Fill #3

## 2016-02-10 DIAGNOSIS — N7011 Chronic salpingitis: Secondary | ICD-10-CM | POA: Diagnosis not present

## 2016-02-10 DIAGNOSIS — N802 Endometriosis of fallopian tube: Secondary | ICD-10-CM | POA: Diagnosis not present

## 2016-02-10 DIAGNOSIS — Z319 Encounter for procreative management, unspecified: Secondary | ICD-10-CM | POA: Diagnosis not present

## 2016-02-10 DIAGNOSIS — Z3141 Encounter for fertility testing: Secondary | ICD-10-CM | POA: Diagnosis not present

## 2016-02-12 DIAGNOSIS — N200 Calculus of kidney: Secondary | ICD-10-CM | POA: Diagnosis not present

## 2016-02-12 DIAGNOSIS — N3010 Interstitial cystitis (chronic) without hematuria: Secondary | ICD-10-CM | POA: Diagnosis not present

## 2016-02-12 DIAGNOSIS — Z87442 Personal history of urinary calculi: Secondary | ICD-10-CM | POA: Diagnosis not present

## 2016-02-12 DIAGNOSIS — E748 Other specified disorders of carbohydrate metabolism: Secondary | ICD-10-CM | POA: Diagnosis not present

## 2016-02-12 MED FILL — BEELITH TABLET: 362-20 | 90 days supply | Qty: 90 | Fill #0

## 2016-02-14 DIAGNOSIS — Z3183 Encounter for assisted reproductive fertility procedure cycle: Secondary | ICD-10-CM | POA: Diagnosis not present

## 2016-02-14 DIAGNOSIS — E288 Other ovarian dysfunction: Secondary | ICD-10-CM | POA: Diagnosis not present

## 2016-02-15 DIAGNOSIS — E288 Other ovarian dysfunction: Secondary | ICD-10-CM | POA: Diagnosis not present

## 2016-02-15 MED FILL — TRANSDERM-SCOP 1.5 MG/72HR: 1 | 1 days supply | Qty: 1 | Fill #0

## 2016-02-17 DIAGNOSIS — Z3183 Encounter for assisted reproductive fertility procedure cycle: Secondary | ICD-10-CM | POA: Diagnosis not present

## 2016-02-19 ENCOUNTER — Other Ambulatory Visit: Payer: Self-pay | Admitting: Pediatrics

## 2016-02-19 MED ORDER — BUDESONIDE 180 MCG/ACT IN AEPB: 2.0000 | INHALATION_SPRAY | Freq: Two times a day (BID) | RESPIRATORY_TRACT | Status: DC

## 2016-02-19 MED FILL — CABERGOLINE 0.5 MG TABLET: 0.5 | 10 days supply | Qty: 5 | Fill #0

## 2016-02-19 NOTE — Telephone Encounter (Signed)
Patient called states she is on Pulmicort 180 mcg 2 puffs daily and has been getting samples only states this works really good for her. Dr Shaune Leeks please advise. Chart put on desk

## 2016-02-19 NOTE — Telephone Encounter (Signed)
PT CALLED AND NEEDS PULMICORT CALLED INTO [facility]. PT MADE APPOINTMENT IN MAY FOR FOLLOW UP. [phone].

## 2016-02-19 NOTE — Telephone Encounter (Signed)
Per Dr Shaune Leeks send in one refill and advise patient to bring in list of current medications patient verbalized understanding.

## 2016-02-19 NOTE — Telephone Encounter (Addendum)
Left message to return call needs to know what strength she is on there no record on this medication in paper chart.

## 2016-02-20 MED FILL — PULMICORT 180 MCG FLEXHALER: 180 | 30 days supply | Qty: 1 | Fill #0

## 2016-02-22 DIAGNOSIS — Z3183 Encounter for assisted reproductive fertility procedure cycle: Secondary | ICD-10-CM | POA: Diagnosis not present

## 2016-02-27 ENCOUNTER — Ambulatory Visit (INDEPENDENT_AMBULATORY_CARE_PROVIDER_SITE_OTHER): Payer: 59 | Admitting: *Deleted

## 2016-02-27 DIAGNOSIS — T63441D Toxic effect of venom of bees, accidental (unintentional), subsequent encounter: Secondary | ICD-10-CM | POA: Diagnosis not present

## 2016-03-04 ENCOUNTER — Encounter: Payer: Self-pay | Admitting: Pediatrics

## 2016-03-04 ENCOUNTER — Ambulatory Visit (INDEPENDENT_AMBULATORY_CARE_PROVIDER_SITE_OTHER): Payer: 59 | Admitting: Pediatrics

## 2016-03-04 VITALS — BP 126/78 | HR 84 | Resp 16

## 2016-03-04 DIAGNOSIS — J342 Deviated nasal septum: Secondary | ICD-10-CM | POA: Diagnosis not present

## 2016-03-04 DIAGNOSIS — T63481D Toxic effect of venom of other arthropod, accidental (unintentional), subsequent encounter: Secondary | ICD-10-CM

## 2016-03-04 DIAGNOSIS — K219 Gastro-esophageal reflux disease without esophagitis: Secondary | ICD-10-CM | POA: Insufficient documentation

## 2016-03-04 DIAGNOSIS — J4540 Moderate persistent asthma, uncomplicated: Secondary | ICD-10-CM

## 2016-03-04 DIAGNOSIS — T63481A Toxic effect of venom of other arthropod, accidental (unintentional), initial encounter: Secondary | ICD-10-CM | POA: Insufficient documentation

## 2016-03-04 DIAGNOSIS — J301 Allergic rhinitis due to pollen: Secondary | ICD-10-CM | POA: Diagnosis not present

## 2016-03-04 MED ORDER — BUDESONIDE 180 MCG/ACT IN AEPB: INHALATION_SPRAY | RESPIRATORY_TRACT | Status: DC

## 2016-03-04 MED ORDER — BUDESONIDE 32 MCG/ACT NA SUSP: NASAL | Status: DC

## 2016-03-04 MED ORDER — CETIRIZINE HCL 10 MG PO TABS: ORAL_TABLET | ORAL | Status: DC

## 2016-03-04 MED ORDER — MONTELUKAST SODIUM 10 MG PO TABS: ORAL_TABLET | ORAL | Status: DC

## 2016-03-04 MED ORDER — EPINEPHRINE 0.3 MG/0.3ML IJ SOAJ: INTRAMUSCULAR | Status: DC

## 2016-03-04 MED ORDER — ALBUTEROL SULFATE HFA 108 (90 BASE) MCG/ACT IN AERS: 2.0000 | INHALATION_SPRAY | Freq: Four times a day (QID) | RESPIRATORY_TRACT | Status: DC | PRN

## 2016-03-04 NOTE — Progress Notes (Signed)
[address] [place] [address] Dept: [phone]  FOLLOW UP NOTE  Patient ID: Rachel Hooper, female    DOB: 17-Jan-1981  Age: 35 y.o. MRN: [id number] Date of Office Visit: 03/04/2016  Assessment Chief Complaint: Asthma  HPI [place] presents for follow-up of asthma, allergic rhinitis an insect allergy. She also has gastroesophageal reflux. Her asthma is well controlled. She is not having significant nasal congestion. She has a deviation of nasal septum to the right side  Current medications are Pulmicort 180- 2 puffs once or twice a day, Ventolin 2 puffs every 4 hours if needed, montelukast 10 mg once a day, Rhinocort 1 spray per nostril twice a day, Benadryl and EpiPen in case of an insect sting   Drug Allergies:  Allergies  Allergen Reactions  . Mixed Vespid Venom   . Wasp Venom     Physical Exam: BP 126/78 mmHg  Pulse 84  Resp 16   Physical Exam  Constitutional: She is oriented to person, place, and time. She appears well-developed and well-nourished.  HENT:  Eyes normal. Ears normal. Nose mild swelling of nasal turbinates with deviation of the nasal septum to the right side. Pharynx normal  Neck: Neck supple.  Cardiovascular:  S1 and S2 normal no murmurs  Pulmonary/Chest:  Clear to percussion and auscultation  Lymphadenopathy:    She has no cervical adenopathy.  Neurological: She is alert and oriented to person, place, and time.  Psychiatric: She has a normal mood and affect. Her behavior is normal. Judgment and thought content normal.  Vitals reviewed.   Diagnost FVC 3.64 L FEV1 3.10 L. Predicted FVC 3.47 L predicted FEV1 2.95 L-the spirometry is in the normal range  Assessment and Plan: 1. Moderate persistent asthma, uncomplicated   2. Allergic rhinitis due to pollen   3. Insect sting allergy, current reaction, accidental or unintentional, subsequent encounter   4. Gastroesophageal reflux disease without esophagitis   5. Deviated  nasal septum     Meds ordered this encounter  Medications  . budesonide (PULMICORT FLEXHALER) 180 MCG/ACT inhaler    Sig: TWO PUFFS TWICE A DAY TO PREVENT COUGH OR WHEEZE. RINSE, GARGLE AND SPIT AFTER USE.    Dispense:  1 each    Refill:  5  . budesonide (RHINOCORT ALLERGY) 32 MCG/ACT nasal spray    Sig: ONE SPRAY EACH NOSTRIL TWICE A DAY FOR NASAL CONGESTION OR DRAINAGE.    Dispense:  8.6 g    Refill:  5  . montelukast (SINGULAIR) 10 MG tablet    Sig: ONE TABLET ONCE A DAY FOR COUGH OR WHEEZE.    Dispense:  30 tablet    Refill:  5  . cetirizine (ZYRTEC) 10 MG tablet    Sig: ONE TABLET ONCE A DAY FOR RUNNY NOSE OR ITCHING.    Dispense:  30 tablet    Refill:  5  . albuterol (PROVENTIL HFA;VENTOLIN HFA) 108 (90 Base) MCG/ACT inhaler    Sig: Inhale 2 puffs into the lungs every 6 (six) hours as needed for wheezing or shortness of breath.    Dispense:  8 g    Refill:  1  . EPINEPHrine (EPIPEN 2-PAK) 0.3 mg/0.3 mL IJ SOAJ injection    Sig: USE AS DIRECTED FOR SEVERE ALLERGIC REACTION.    Dispense:  2 Device    Refill:  2    Patient Instructions  Pulmicort 180- 2 puffs twice a day to prevent coughing or wheezing Montelukast 10 mg once a day for  a coughing or wheezing Zyrtec 10 mg once a day for runny nose Rhinocort 1 spray per nostril twice a day for stuffy nose Ventolin 2 puffs every 4 hours if needed for wheezing or coughing spells  If you have an insect sting take Benadryl 50 mg every 4 hours and if you have life-threatening symptoms inject him with EpiPen 0.3 mg    Return in about 1 year (around 03/04/2017).    Thank you for the opportunity to care for this patient.  Please do not hesitate to contact me with questions.  Penne Lash, M.D.  [facility] [address] [place], [place] [address] [phone]

## 2016-03-04 NOTE — Patient Instructions (Signed)
Pulmicort 180- 2 puffs twice a day to prevent coughing or wheezing Montelukast 10 mg once a day for a coughing or wheezing Zyrtec 10 mg once a day for runny nose Rhinocort 1 spray per nostril twice a day for stuffy nose Ventolin 2 puffs every 4 hours if needed for wheezing or coughing spells  If you have an insect sting take Benadryl 50 mg every 4 hours and if you have life-threatening symptoms inject him with EpiPen 0.3 mg

## 2016-03-07 MED FILL — METOPROLOL SUCC ER 100 MG T: 100 | 90 days supply | Qty: 90 | Fill #0

## 2016-03-11 MED FILL — NORETHINDRONE 5 MG TABLET: 5 | 30 days supply | Qty: 30 | Fill #1

## 2016-03-15 MED FILL — TRANSDERM-SCOP 1.5 MG/72HR: 1 | 1 days supply | Qty: 1 | Fill #0

## 2016-03-19 DIAGNOSIS — Z79899 Other long term (current) drug therapy: Secondary | ICD-10-CM | POA: Diagnosis not present

## 2016-03-19 DIAGNOSIS — N809 Endometriosis, unspecified: Secondary | ICD-10-CM | POA: Diagnosis not present

## 2016-03-19 DIAGNOSIS — N803 Endometriosis of pelvic peritoneum: Secondary | ICD-10-CM | POA: Diagnosis not present

## 2016-03-19 DIAGNOSIS — N7011 Chronic salpingitis: Secondary | ICD-10-CM | POA: Diagnosis not present

## 2016-03-19 DIAGNOSIS — I10 Essential (primary) hypertension: Secondary | ICD-10-CM | POA: Diagnosis not present

## 2016-03-19 DIAGNOSIS — E785 Hyperlipidemia, unspecified: Secondary | ICD-10-CM | POA: Diagnosis not present

## 2016-03-19 DIAGNOSIS — N736 Female pelvic peritoneal adhesions (postinfective): Secondary | ICD-10-CM | POA: Diagnosis not present

## 2016-03-19 DIAGNOSIS — N978 Female infertility of other origin: Secondary | ICD-10-CM | POA: Diagnosis not present

## 2016-03-19 DIAGNOSIS — N801 Endometriosis of ovary: Secondary | ICD-10-CM | POA: Diagnosis not present

## 2016-03-19 DIAGNOSIS — J45909 Unspecified asthma, uncomplicated: Secondary | ICD-10-CM | POA: Diagnosis not present

## 2016-03-19 DIAGNOSIS — N802 Endometriosis of fallopian tube: Secondary | ICD-10-CM | POA: Diagnosis not present

## 2016-03-20 MED FILL — ESTRADIOL 2 MG TABLET: 2 | 30 days supply | Qty: 60 | Fill #1

## 2016-03-20 MED FILL — oxyCODONE HCL 5 MG TABS: 5 | 7 days supply | Qty: 20 | Fill #0

## 2016-03-20 MED FILL — VENTOLIN HFA 90 MCG INHALER: 108 (90 BAS | 25 days supply | Qty: 18 | Fill #0

## 2016-03-20 MED FILL — ESTRADIOL 0.1 MG PATCH: 0.1 | 24 days supply | Qty: 8 | Fill #3

## 2016-03-21 DIAGNOSIS — E288 Other ovarian dysfunction: Secondary | ICD-10-CM | POA: Diagnosis not present

## 2016-03-21 DIAGNOSIS — N809 Endometriosis, unspecified: Secondary | ICD-10-CM | POA: Diagnosis not present

## 2016-03-25 DIAGNOSIS — N978 Female infertility of other origin: Secondary | ICD-10-CM | POA: Diagnosis not present

## 2016-03-25 DIAGNOSIS — E288 Other ovarian dysfunction: Secondary | ICD-10-CM | POA: Diagnosis not present

## 2016-03-25 DIAGNOSIS — N803 Endometriosis of pelvic peritoneum: Secondary | ICD-10-CM | POA: Diagnosis not present

## 2016-03-25 DIAGNOSIS — Z319 Encounter for procreative management, unspecified: Secondary | ICD-10-CM | POA: Diagnosis not present

## 2016-03-26 MED FILL — metroNIDAZOLE 500 MG TABS: 500 | 7 days supply | Qty: 14 | Fill #0

## 2016-03-26 MED FILL — OVIDREL 250 MCG/0.5 ML SYRG: 250 | 28 days supply | Qty: 1 | Fill #0

## 2016-04-10 ENCOUNTER — Ambulatory Visit: Admit: 2016-04-10 | Payer: Self-pay | Admitting: Obstetrics and Gynecology

## 2016-04-10 SURGERY — ROBOTIC ASSISTED LAPAROSCOPIC LYSIS OF ADHESION
Anesthesia: General | Laterality: Left

## 2016-04-12 MED FILL — POTASSIUM CITRATE ER 10 MEQ: 10 MEQ | 30 days supply | Qty: 120 | Fill #4

## 2016-04-22 MED FILL — MONTELUKAST SOD 10 MG TAB: 10 | 90 days supply | Qty: 90 | Fill #0

## 2016-04-22 MED FILL — TARON-C DHA CAPSULE: 53.5-38-1 | 30 days supply | Qty: 30 | Fill #5

## 2016-04-26 MED FILL — PROGESTERONE OIL 50 MG/ML V: 50 | 30 days supply | Qty: 30 | Fill #1

## 2016-04-29 MED FILL — diazePAM 10 MG TABS: 10 | 1 days supply | Qty: 1 | Fill #0

## 2016-04-30 DIAGNOSIS — T63441D Toxic effect of venom of bees, accidental (unintentional), subsequent encounter: Secondary | ICD-10-CM | POA: Diagnosis not present

## 2016-05-01 DIAGNOSIS — T63441D Toxic effect of venom of bees, accidental (unintentional), subsequent encounter: Secondary | ICD-10-CM | POA: Diagnosis not present

## 2016-05-02 ENCOUNTER — Ambulatory Visit (INDEPENDENT_AMBULATORY_CARE_PROVIDER_SITE_OTHER): Payer: 59

## 2016-05-02 DIAGNOSIS — T63441D Toxic effect of venom of bees, accidental (unintentional), subsequent encounter: Secondary | ICD-10-CM | POA: Diagnosis not present

## 2016-05-02 MED FILL — OMEPRAZOLE DR 20 MG CAPSULE: 20 | 90 days supply | Qty: 90 | Fill #1

## 2016-05-11 DIAGNOSIS — Z3200 Encounter for pregnancy test, result unknown: Secondary | ICD-10-CM | POA: Diagnosis not present

## 2016-05-13 DIAGNOSIS — Z3201 Encounter for pregnancy test, result positive: Secondary | ICD-10-CM | POA: Diagnosis not present

## 2016-05-13 DIAGNOSIS — N96 Recurrent pregnancy loss: Secondary | ICD-10-CM | POA: Diagnosis not present

## 2016-05-13 MED FILL — METHYLPREDNISOLONE 4 MG TAB: 4 | 4 days supply | Qty: 16 | Fill #0

## 2016-05-13 MED FILL — NITROFURANTOIN MONO-MCR 100: 100 | 7 days supply | Qty: 14 | Fill #0

## 2016-05-17 DIAGNOSIS — Z3201 Encounter for pregnancy test, result positive: Secondary | ICD-10-CM | POA: Diagnosis not present

## 2016-05-20 DIAGNOSIS — Z3183 Encounter for assisted reproductive fertility procedure cycle: Secondary | ICD-10-CM | POA: Diagnosis not present

## 2016-05-20 DIAGNOSIS — Z3200 Encounter for pregnancy test, result unknown: Secondary | ICD-10-CM | POA: Diagnosis not present

## 2016-05-22 DIAGNOSIS — O208 Other hemorrhage in early pregnancy: Secondary | ICD-10-CM | POA: Diagnosis not present

## 2016-05-22 DIAGNOSIS — O2621 Pregnancy care for patient with recurrent pregnancy loss, first trimester: Secondary | ICD-10-CM | POA: Diagnosis not present

## 2016-05-22 DIAGNOSIS — O032 Embolism following incomplete spontaneous abortion: Secondary | ICD-10-CM | POA: Diagnosis not present

## 2016-05-22 MED FILL — PROGESTERONE OIL 50 MG/ML V: 50 | 30 days supply | Qty: 30 | Fill #0

## 2016-05-22 MED FILL — ESTRADIOL 2 MG TABLET: 2 | 30 days supply | Qty: 60 | Fill #0

## 2016-05-22 MED FILL — BD 3 ML SYRINGE WITH NEEDLE: 22G X 1-1/2 | 30 days supply | Qty: 30 | Fill #0

## 2016-05-22 MED FILL — ESTRADIOL 0.1 MG PATCH: 0.1 | 28 days supply | Qty: 8 | Fill #0

## 2016-05-23 DIAGNOSIS — O021 Missed abortion: Secondary | ICD-10-CM | POA: Diagnosis not present

## 2016-05-23 MED FILL — BD NEEDLES 22GX1.5: 22G X 1-1/2 | 30 days supply | Qty: 30 | Fill #0

## 2016-05-24 DIAGNOSIS — N96 Recurrent pregnancy loss: Secondary | ICD-10-CM | POA: Diagnosis not present

## 2016-05-30 MED FILL — METOPROLOL SUCC ER 100 MG T: 100 | 90 days supply | Qty: 90 | Fill #1

## 2016-05-30 MED FILL — POTASSIUM CITRATE ER 10 MEQ: 10 MEQ | 90 days supply | Qty: 360 | Fill #5

## 2016-06-11 DIAGNOSIS — L309 Dermatitis, unspecified: Secondary | ICD-10-CM | POA: Diagnosis not present

## 2016-06-11 DIAGNOSIS — L853 Xerosis cutis: Secondary | ICD-10-CM | POA: Diagnosis not present

## 2016-06-11 MED FILL — UREA 39% CREAM: 39 | 30 days supply | Qty: 227 | Fill #0

## 2016-06-19 MED FILL — ESCITALOPRAM 20 MG TABLET: 20 | 90 days supply | Qty: 90 | Fill #0

## 2016-06-20 DIAGNOSIS — Z319 Encounter for procreative management, unspecified: Secondary | ICD-10-CM | POA: Diagnosis not present

## 2016-06-26 DIAGNOSIS — L718 Other rosacea: Secondary | ICD-10-CM | POA: Diagnosis not present

## 2016-06-26 MED FILL — DOXYCYCLINE HYCLATE 100 MG: 100 | 90 days supply | Qty: 180 | Fill #0

## 2016-06-28 ENCOUNTER — Ambulatory Visit (INDEPENDENT_AMBULATORY_CARE_PROVIDER_SITE_OTHER): Payer: 59

## 2016-06-28 DIAGNOSIS — T63441D Toxic effect of venom of bees, accidental (unintentional), subsequent encounter: Secondary | ICD-10-CM

## 2016-06-28 MED FILL — PULMICORT 180 MCG FLEXHALER: 180 | 30 days supply | Qty: 1 | Fill #0

## 2016-06-28 MED FILL — ESTRADIOL 0.1 MG PATCH: 0.1 | 28 days supply | Qty: 8 | Fill #1

## 2016-07-05 DIAGNOSIS — Z3183 Encounter for assisted reproductive fertility procedure cycle: Secondary | ICD-10-CM | POA: Diagnosis not present

## 2016-07-05 MED FILL — PROGESTERONE OIL 50 MG/ML V: 50 | 30 days supply | Qty: 30 | Fill #1

## 2016-07-05 MED FILL — predniSONE 10 MG TABS: 10 | 15 days supply | Qty: 30 | Fill #0

## 2016-07-05 MED FILL — BD INSULIN SYR 1 ML 6MMX31G: 31G X 15/64 | 15 days supply | Qty: 30 | Fill #0

## 2016-07-05 MED FILL — HEPARIN NA 5,000 UNITS/ML V: 5000 | 15 days supply | Qty: 30 | Fill #0

## 2016-07-08 MED FILL — ENDOMETRIN 100 MG SUPP: 100 | 32 days supply | Qty: 63 | Fill #0

## 2016-07-10 MED FILL — metFORMIN HCL 1000 MG TABS: 1000 | 30 days supply | Qty: 60 | Fill #0

## 2016-07-10 MED FILL — diazePAM 10 MG TABS: 10 | 1 days supply | Qty: 1 | Fill #1

## 2016-07-10 MED FILL — TARON-C DHA CAPSULE: 53.5-38-1 | 30 days supply | Qty: 30 | Fill #0

## 2016-07-10 MED FILL — METHYLPREDNISOLONE 4 MG TAB: 4 | 4 days supply | Qty: 16 | Fill #1

## 2016-07-12 DIAGNOSIS — Z3183 Encounter for assisted reproductive fertility procedure cycle: Secondary | ICD-10-CM | POA: Diagnosis not present

## 2016-07-12 MED FILL — MONTELUKAST SOD 10 MG TAB: 10 | 90 days supply | Qty: 90 | Fill #1

## 2016-07-23 DIAGNOSIS — Z3200 Encounter for pregnancy test, result unknown: Secondary | ICD-10-CM | POA: Diagnosis not present

## 2016-07-26 DIAGNOSIS — F419 Anxiety disorder, unspecified: Secondary | ICD-10-CM | POA: Diagnosis not present

## 2016-07-26 DIAGNOSIS — K219 Gastro-esophageal reflux disease without esophagitis: Secondary | ICD-10-CM | POA: Diagnosis not present

## 2016-07-26 DIAGNOSIS — I10 Essential (primary) hypertension: Secondary | ICD-10-CM | POA: Diagnosis not present

## 2016-07-26 DIAGNOSIS — Z6838 Body mass index (BMI) 38.0-38.9, adult: Secondary | ICD-10-CM | POA: Diagnosis not present

## 2016-07-26 DIAGNOSIS — N840 Polyp of corpus uteri: Secondary | ICD-10-CM | POA: Diagnosis not present

## 2016-07-26 DIAGNOSIS — E7800 Pure hypercholesterolemia, unspecified: Secondary | ICD-10-CM | POA: Diagnosis not present

## 2016-07-26 DIAGNOSIS — Z3141 Encounter for fertility testing: Secondary | ICD-10-CM | POA: Diagnosis not present

## 2016-07-26 MED FILL — CIPROFLOXACIN HCL 500 MG TA: 500 | 10 days supply | Qty: 20 | Fill #0

## 2016-07-26 MED FILL — metroNIDAZOLE 500 MG TABS: 500 | 10 days supply | Qty: 20 | Fill #0

## 2016-07-26 MED FILL — OMEPRAZOLE DR 20 MG CAPSULE: 20 | 90 days supply | Qty: 90 | Fill #0

## 2016-07-29 DIAGNOSIS — N8500 Endometrial hyperplasia, unspecified: Secondary | ICD-10-CM | POA: Diagnosis not present

## 2016-07-29 DIAGNOSIS — N96 Recurrent pregnancy loss: Secondary | ICD-10-CM | POA: Diagnosis not present

## 2016-07-29 DIAGNOSIS — Z3141 Encounter for fertility testing: Secondary | ICD-10-CM | POA: Diagnosis not present

## 2016-08-07 DIAGNOSIS — Z3183 Encounter for assisted reproductive fertility procedure cycle: Secondary | ICD-10-CM | POA: Diagnosis not present

## 2016-08-09 MED FILL — OXYCODONE W/APAP 5/325 TAB: 5-325 | 2 days supply | Qty: 5 | Fill #0

## 2016-08-09 MED FILL — diazePAM 10 MG TABS: 10 | 1 days supply | Qty: 1 | Fill #0

## 2016-08-09 MED FILL — ESTRADIOL 2 MG TABLET: 2 | 30 days supply | Qty: 90 | Fill #0

## 2016-08-12 MED FILL — NEUPOGEN 300 MCG/0.5 ML SYR: 300 | 1 days supply | Qty: 1 | Fill #0

## 2016-08-12 MED FILL — ESTRADIOL 0.1 MG PATCH: 0.1 | 28 days supply | Qty: 8 | Fill #2

## 2016-08-12 MED FILL — diazePAM 10 MG TABS: 10 | 1 days supply | Qty: 1 | Fill #0

## 2016-08-12 MED FILL — METHYLPREDNISOLONE 4 MG TAB: 4 | 4 days supply | Qty: 16 | Fill #0

## 2016-08-14 DIAGNOSIS — Z3183 Encounter for assisted reproductive fertility procedure cycle: Secondary | ICD-10-CM | POA: Diagnosis not present

## 2016-08-22 MED FILL — BD 3 ML SYRINGE WITH NEEDLE: 23G X 1-1/2 | 30 days supply | Qty: 30 | Fill #0

## 2016-08-22 MED FILL — METOPROLOL SUCC ER 100 MG T: 100 | 90 days supply | Qty: 90 | Fill #0

## 2016-08-22 MED FILL — HEPARIN NA 5,000 UNITS/ML V: 5000 | 15 days supply | Qty: 30 | Fill #1

## 2016-08-22 MED FILL — PROGESTERONE OIL 50 MG/ML V: 50 | 15 days supply | Qty: 30 | Fill #0

## 2016-08-23 DIAGNOSIS — O0281 Inappropriate change in quantitative human chorionic gonadotropin (hCG) in early pregnancy: Secondary | ICD-10-CM | POA: Diagnosis not present

## 2016-08-23 MED FILL — NEUPOGEN 300 MCG/ML VIAL: 300 | 1 days supply | Qty: 1 | Fill #0

## 2016-08-26 DIAGNOSIS — O0281 Inappropriate change in quantitative human chorionic gonadotropin (hCG) in early pregnancy: Secondary | ICD-10-CM | POA: Diagnosis not present

## 2016-08-26 DIAGNOSIS — Z3200 Encounter for pregnancy test, result unknown: Secondary | ICD-10-CM | POA: Diagnosis not present

## 2016-08-27 DIAGNOSIS — O0281 Inappropriate change in quantitative human chorionic gonadotropin (hCG) in early pregnancy: Secondary | ICD-10-CM | POA: Diagnosis not present

## 2016-08-27 MED FILL — ENDOMETRIN 100 MG SUPP: 100 | 32 days supply | Qty: 63 | Fill #1

## 2016-08-27 MED FILL — TARON-C DHA CAPSULE: 53.5-38-1 | 30 days supply | Qty: 30 | Fill #1

## 2016-08-27 MED FILL — NEUPOGEN 300 MCG/0.5 ML SYR: 300 | 24 days supply | Qty: 4 | Fill #0

## 2016-08-29 DIAGNOSIS — Z3200 Encounter for pregnancy test, result unknown: Secondary | ICD-10-CM | POA: Diagnosis not present

## 2016-09-02 DIAGNOSIS — O039 Complete or unspecified spontaneous abortion without complication: Secondary | ICD-10-CM | POA: Diagnosis not present

## 2016-09-04 ENCOUNTER — Ambulatory Visit (INDEPENDENT_AMBULATORY_CARE_PROVIDER_SITE_OTHER): Payer: 59 | Admitting: *Deleted

## 2016-09-04 DIAGNOSIS — T63441D Toxic effect of venom of bees, accidental (unintentional), subsequent encounter: Secondary | ICD-10-CM

## 2016-09-06 MED FILL — OVIDREL 250 MCG/0.5 ML SYRG: 250 | 30 days supply | Qty: 1 | Fill #0

## 2016-09-06 MED FILL — LETROZOLE 2.5 MG TABLET: 2.5 | 30 days supply | Qty: 10 | Fill #0

## 2016-09-16 DIAGNOSIS — Z319 Encounter for procreative management, unspecified: Secondary | ICD-10-CM | POA: Diagnosis not present

## 2016-09-19 DIAGNOSIS — Z319 Encounter for procreative management, unspecified: Secondary | ICD-10-CM | POA: Diagnosis not present

## 2016-09-21 DIAGNOSIS — Z3189 Encounter for other procreative management: Secondary | ICD-10-CM | POA: Diagnosis not present

## 2016-09-25 MED FILL — ESCITALOPRAM 20 MG TABLET: 20 | 90 days supply | Qty: 90 | Fill #0

## 2016-10-04 MED FILL — BEELITH TABLET: 362-20 | 90 days supply | Qty: 90 | Fill #1

## 2016-10-04 MED FILL — LETROZOLE 2.5 MG TABLET: 2.5 | 5 days supply | Qty: 15 | Fill #0

## 2016-10-07 MED FILL — DOXYCYCLINE HYCLATE 100 MG: 100 | 90 days supply | Qty: 180 | Fill #1

## 2016-10-07 MED FILL — VENTOLIN HFA 90 MCG INHALER: 108 (90 BAS | 25 days supply | Qty: 18 | Fill #1

## 2016-10-07 MED FILL — EPINEPHRINE 0.3 MG AUTO-INJ: 0.3 | 30 days supply | Qty: 2 | Fill #0

## 2016-10-07 MED FILL — ESCITALOPRAM 20 MG TABLET: 20 | 90 days supply | Qty: 90 | Fill #1

## 2016-10-08 ENCOUNTER — Other Ambulatory Visit: Payer: Self-pay

## 2016-10-08 ENCOUNTER — Other Ambulatory Visit: Payer: Self-pay | Admitting: Pediatrics

## 2016-10-08 MED ORDER — BUDESONIDE 180 MCG/ACT IN AEPB: INHALATION_SPRAY | RESPIRATORY_TRACT | 2 refills | Status: DC

## 2016-10-08 MED ORDER — MONTELUKAST SODIUM 10 MG PO TABS: ORAL_TABLET | ORAL | 2 refills | Status: DC

## 2016-10-08 MED FILL — metFORMIN HCL 1000 MG TABS: 1000 | 30 days supply | Qty: 60 | Fill #1

## 2016-10-08 MED FILL — PULMICORT 180 MCG FLEXHALER: 180 | 90 days supply | Qty: 3 | Fill #0

## 2016-10-08 MED FILL — MONTELUKAST SOD 10 MG TAB: 10 | 90 days supply | Qty: 90 | Fill #0

## 2016-10-08 NOTE — Telephone Encounter (Signed)
Patient saw Dr. Shaune Leeks on 03-04-16 in [facility]. She is requesting a prescription for her Singulair and said her pharmacy denied it. I looked in her chart and saw it was over 6 months and explained to her that she needed to make and appointment. She said she didn't fit that protocol because Dr. Shaune Leeks told her she only needed to be seen once a year.

## 2016-10-08 NOTE — Telephone Encounter (Signed)
Left message to call back  

## 2016-10-10 NOTE — Telephone Encounter (Signed)
Script sent into pharmacy. Per Dr. Shaune Leeks last note patient was to follow up in 1 year.

## 2016-10-15 MED FILL — OVIDREL 250 MCG/0.5 ML SYRG: 250 | 30 days supply | Qty: 1 | Fill #1

## 2016-10-15 MED FILL — TARON-C DHA CAPSULE: 53.5-38-1 | 30 days supply | Qty: 30 | Fill #2

## 2016-10-16 DIAGNOSIS — Z319 Encounter for procreative management, unspecified: Secondary | ICD-10-CM | POA: Diagnosis not present

## 2016-11-01 ENCOUNTER — Ambulatory Visit (INDEPENDENT_AMBULATORY_CARE_PROVIDER_SITE_OTHER): Payer: 59

## 2016-11-01 DIAGNOSIS — T63441D Toxic effect of venom of bees, accidental (unintentional), subsequent encounter: Secondary | ICD-10-CM | POA: Diagnosis not present

## 2016-11-01 MED FILL — LETROZOLE 2.5 MG TABLET: 2.5 | 5 days supply | Qty: 15 | Fill #1

## 2016-11-14 MED FILL — OMEPRAZOLE DR 20 MG CAPSULE: 20 | 90 days supply | Qty: 90 | Fill #1

## 2016-11-15 DIAGNOSIS — Z319 Encounter for procreative management, unspecified: Secondary | ICD-10-CM | POA: Diagnosis not present

## 2016-11-28 MED FILL — LETROZOLE 2.5 MG TABLET: 2.5 | 5 days supply | Qty: 15 | Fill #2

## 2016-11-28 MED FILL — METOPROLOL SUCC ER 100 MG T: 100 | 90 days supply | Qty: 90 | Fill #1

## 2016-12-09 MED FILL — POTASSIUM CITRATE ER 10 MEQ: 10 MEQ | 15 days supply | Qty: 60 | Fill #0

## 2017-01-02 ENCOUNTER — Ambulatory Visit (INDEPENDENT_AMBULATORY_CARE_PROVIDER_SITE_OTHER): Payer: 59 | Admitting: *Deleted

## 2017-01-02 DIAGNOSIS — T63441D Toxic effect of venom of bees, accidental (unintentional), subsequent encounter: Secondary | ICD-10-CM | POA: Diagnosis not present

## 2017-01-13 MED FILL — POTASSIUM CITRATE ER 10 MEQ: 10 MEQ | 15 days supply | Qty: 60 | Fill #1

## 2017-01-13 MED FILL — BEELITH TABLET: 362-20 | 90 days supply | Qty: 90 | Fill #2

## 2017-01-13 MED FILL — MONTELUKAST SOD 10 MG TAB: 10 | 90 days supply | Qty: 90 | Fill #1

## 2017-02-03 ENCOUNTER — Telehealth: Payer: Self-pay | Admitting: Pediatrics

## 2017-02-03 NOTE — Telephone Encounter (Signed)
Please mail a detailed payment statement for the month of march 2018 soon as possible. Thanks

## 2017-02-03 NOTE — Telephone Encounter (Signed)
Rachel Hooper will mail - kt

## 2017-02-10 ENCOUNTER — Telehealth: Payer: Self-pay | Admitting: Pediatrics

## 2017-02-10 DIAGNOSIS — N3010 Interstitial cystitis (chronic) without hematuria: Secondary | ICD-10-CM | POA: Diagnosis not present

## 2017-02-10 DIAGNOSIS — E7253 Hyperoxaluria: Secondary | ICD-10-CM | POA: Diagnosis not present

## 2017-02-10 DIAGNOSIS — N200 Calculus of kidney: Secondary | ICD-10-CM | POA: Diagnosis not present

## 2017-02-10 NOTE — Telephone Encounter (Signed)
Patient is wondering if there are any samples of SYMBICORT available? Patient cannot afford the out of pocket cost

## 2017-02-10 NOTE — Telephone Encounter (Signed)
Called patient she is requesting sample of  Pulmicort 180 advised we would leave a sample up front also she needs to schedule an appt she is due in May. Patient verbalized understanding.

## 2017-02-11 MED FILL — OMEPRAZOLE DR 20 MG CAPSULE: 20 | 90 days supply | Qty: 90 | Fill #0

## 2017-02-17 MED FILL — POTASSIUM CITRATE ER 10 MEQ: 10 MEQ | 90 days supply | Qty: 360 | Fill #0

## 2017-02-26 DIAGNOSIS — E7800 Pure hypercholesterolemia, unspecified: Secondary | ICD-10-CM | POA: Diagnosis not present

## 2017-02-26 DIAGNOSIS — K219 Gastro-esophageal reflux disease without esophagitis: Secondary | ICD-10-CM | POA: Diagnosis not present

## 2017-02-26 DIAGNOSIS — I10 Essential (primary) hypertension: Secondary | ICD-10-CM | POA: Diagnosis not present

## 2017-02-26 DIAGNOSIS — J209 Acute bronchitis, unspecified: Secondary | ICD-10-CM | POA: Diagnosis not present

## 2017-02-26 DIAGNOSIS — J329 Chronic sinusitis, unspecified: Secondary | ICD-10-CM | POA: Diagnosis not present

## 2017-02-26 DIAGNOSIS — F419 Anxiety disorder, unspecified: Secondary | ICD-10-CM | POA: Diagnosis not present

## 2017-02-26 MED FILL — ATORVASTATIN 20 MG TABLET: 20 | 90 days supply | Qty: 90 | Fill #0

## 2017-02-26 MED FILL — CLARITHROMYCIN 500 MG TAB: 500 | 10 days supply | Qty: 20 | Fill #0

## 2017-02-26 MED FILL — METOPROLOL SUCC ER 100 MG T: 100 | 90 days supply | Qty: 90 | Fill #0

## 2017-03-03 ENCOUNTER — Ambulatory Visit (INDEPENDENT_AMBULATORY_CARE_PROVIDER_SITE_OTHER): Payer: 59

## 2017-03-03 DIAGNOSIS — T63441D Toxic effect of venom of bees, accidental (unintentional), subsequent encounter: Secondary | ICD-10-CM

## 2017-03-27 ENCOUNTER — Ambulatory Visit (INDEPENDENT_AMBULATORY_CARE_PROVIDER_SITE_OTHER): Payer: 59 | Admitting: Allergy

## 2017-03-27 ENCOUNTER — Encounter: Payer: Self-pay | Admitting: Allergy

## 2017-03-27 VITALS — BP 118/76 | HR 80 | Temp 98.6°F | Resp 16 | Ht 65.5 in | Wt 238.0 lb

## 2017-03-27 DIAGNOSIS — J301 Allergic rhinitis due to pollen: Secondary | ICD-10-CM | POA: Diagnosis not present

## 2017-03-27 DIAGNOSIS — T63441D Toxic effect of venom of bees, accidental (unintentional), subsequent encounter: Secondary | ICD-10-CM

## 2017-03-27 DIAGNOSIS — J4540 Moderate persistent asthma, uncomplicated: Secondary | ICD-10-CM

## 2017-03-27 MED ORDER — MONTELUKAST SODIUM 10 MG PO TABS
ORAL_TABLET | ORAL | 2 refills | Status: DC
Start: 2017-03-27 — End: 2018-04-09

## 2017-03-27 MED ORDER — EPINEPHRINE 0.3 MG/0.3ML IJ SOAJ
INTRAMUSCULAR | 2 refills | Status: DC
Start: 2017-03-27 — End: 2018-05-06

## 2017-03-27 MED ORDER — BUDESONIDE 180 MCG/ACT IN AEPB
INHALATION_SPRAY | RESPIRATORY_TRACT | 1 refills | Status: DC
Start: 2017-03-27 — End: 2018-05-06

## 2017-03-27 MED ORDER — BUDESONIDE 32 MCG/ACT NA SUSP
NASAL | 5 refills | Status: DC
Start: 2017-03-27 — End: 2018-05-06

## 2017-03-27 MED ORDER — ALBUTEROL SULFATE HFA 108 (90 BASE) MCG/ACT IN AERS: 2.0000 | INHALATION_SPRAY | Freq: Four times a day (QID) | RESPIRATORY_TRACT | 1 refills | Status: DC | PRN

## 2017-03-27 MED ORDER — CETIRIZINE HCL 10 MG PO TABS: ORAL_TABLET | ORAL | 2 refills | Status: DC

## 2017-03-27 MED FILL — metFORMIN HCL 1000 MG TABS: 1000 | 30 days supply | Qty: 60 | Fill #2

## 2017-03-27 NOTE — Progress Notes (Signed)
Follow-up Note  RE: Rachel Hooper MRN: [id number] DOB: 04/29/1981 Date of Office Visit: 03/27/2017   History of present illness: Rachel Hooper is a 36 y.o. female presenting today for follow-up of asthma, allergic rhinitis, venom allergy, GERD.  She was last seen by Dr. Shaune Leeks on 03/04/16.  She reports she had some type of respiratory illness about a month ago.  She was sure she likely had PNA but did not have any imaging done.  She was treated with a z-pak and a round of clarithromycin which she does not feel made her much better.  She reports a lot of chest congestion and coughing.  She still have a residual cough.  She was needing to use her albuterol daily during this illness but since is using it on average 1x/week.  During her illness she did take her pulmicort 2puffs twice a day but since she has been feeling better she went back to her usual 1 puff at night.   For her allergies se takes rhinocort 2 sprays daily, singulair and zyrtec daily with good control of her symptoms.   She continues on venom immunotherapy every 8 wk injections at maintenance without any local or systemic reactions.  She has access to Epipen if she has a reaction.   She reports she is still trying to get pregnant.    Review of systems: Review of Systems  Constitutional: Negative for chills, fever and malaise/fatigue.  HENT: Negative for congestion, ear discharge, ear pain, nosebleeds, sinus pain, sore throat and tinnitus.   Eyes: Negative for discharge and redness.  Respiratory: Positive for cough. Negative for shortness of breath and wheezing.   Cardiovascular: Negative for chest pain.  Gastrointestinal: Negative for abdominal pain, diarrhea, heartburn, nausea and vomiting.  Musculoskeletal: Negative for joint pain and myalgias.  Skin: Negative for itching and rash.  Neurological: Negative for headaches.    All other systems negative unless noted above in HPI  Past medical/social/surgical/family  history have been reviewed and are unchanged unless specifically indicated below.  No changes  Medication List: Allergies as of 03/27/2017      Reactions   Bee Venom Anaphylaxis   And wasp venom   Pneumococcal Vaccines Anaphylaxis   Mixed Vespid Venom    Wasp Venom       Medication List       Accurate as of 03/27/17  7:27 PM. Always use your most recent med list.          albuterol 108 (90 Base) MCG/ACT inhaler Commonly known as:  PROVENTIL HFA;VENTOLIN HFA Inhale 2 puffs into the lungs every 6 (six) hours as needed for wheezing or shortness of breath.   aspirin 81 MG tablet Take 81 mg by mouth daily.   budesonide 180 MCG/ACT inhaler Commonly known as:  PULMICORT FLEXHALER TWO PUFFS TWICE A DAY TO PREVENT COUGH OR WHEEZE. RINSE, GARGLE AND SPIT AFTER USE.   budesonide 32 MCG/ACT nasal spray Commonly known as:  RHINOCORT ALLERGY ONE SPRAY EACH NOSTRIL TWICE A DAY FOR NASAL CONGESTION OR DRAINAGE.   cetirizine 10 MG tablet Commonly known as:  ZYRTEC ONE TABLET ONCE A DAY FOR RUNNY NOSE OR ITCHING.   EPINEPHrine 0.3 mg/0.3 mL Soaj injection Commonly known as:  EPIPEN 2-PAK USE AS DIRECTED FOR SEVERE ALLERGIC REACTION.   magnesium oxide-pyridoxine 362-20 MG Tabs tablet Commonly known as:  BEELITH Take 1 tablet by mouth daily.   metoprolol succinate 100 MG 24 hr tablet Commonly known as:  TOPROL-XL Take  100 mg by mouth at bedtime. Take with or immediately following a meal.   montelukast 10 MG tablet Commonly known as:  SINGULAIR ONE TABLET ONCE A DAY FOR COUGH OR WHEEZE.   oxyCODONE-acetaminophen 7.5-325 MG tablet Commonly known as:  PERCOCET Take 1 tablet by mouth every 4 (four) hours as needed.   phenylephrine 1 % nasal spray Commonly known as:  NEO-SYNEPHRINE Place 2 drops into both nostrils every 6 (six) hours as needed for congestion. Reported on 03/04/2016   potassium citrate 10 MEQ (1080 MG) SR tablet Commonly known as:  UROCIT-K Take 20 mEq by mouth  every evening. Reported on 03/04/2016   tamsulosin 0.4 MG Caps capsule Commonly known as:  FLOMAX Take 1 capsule (0.4 mg total) by mouth daily after supper.       Known medication allergies: Allergies  Allergen Reactions  . Bee Venom Anaphylaxis    And wasp venom  . Pneumococcal Vaccines Anaphylaxis  . Mixed Vespid Venom   . Wasp Venom      Physical examination: Blood pressure 118/76, pulse 80, temperature 98.6 F (37 C), temperature source Oral, resp. rate 16, height 5' 5.5" (1.664 m), weight 238 lb (108 kg), SpO2 97 %.  General: Alert, interactive, in no acute distress, Obese HEENT: PERRLA, TMs pearly gray, turbinates minimally edematous without discharge, post-pharynx non erythematous. Neck: Supple without lymphadenopathy. Lungs: Clear to auscultation without wheezing, rhonchi or rales. {no increased work of breathing. CV: Normal S1, S2 without murmurs. Abdomen: Nondistended, nontender. Skin: Warm and dry, without lesions or rashes. Extremities:  No clubbing, cyanosis or edema. Neuro:   Grossly intact.  Diagnositics/Labs:  Spirometry: FEV1: 2.79L 93%, FVC: 3.25L  92%, ratio consistent with Nonobstructive pattern  Assessment and plan:   Asthma,Moderate persistent,    - Pulmicort 180- 2 puffs twice a day to prevent coughing or wheezing.  Once you are back to your baseline can go back to daily dosing.   - Montelukast 10 mg once a day for a coughing or wheezing  - Ventolin 2 puffs every 4-6 hours if needed for wheezing, coughing, shortness of breath or chest tightness.  Monitor frequency of use.  Asthma control goals:   Full participation in all desired activities (may need albuterol before activity)  Albuterol use two time or less a week on average (not counting use with activity)  Cough interfering with sleep two time or less a month  Oral steroids no more than once a year  No hospitalizations  Allergic rhinitis   - Zyrtec 10 mg once a day for runny nose  -  Rhinocort 1-2 spray per nostril twice a day for stuffy nose  - Montelukast as above  Venom allergy  - continue venom injections every 8 weeks  - If you have an insect sting take Benadryl 50 mg every 4 hours and if you have life-threatening symptoms inject him with EpiPen 0.3 mg  Follow-up yearly or sooner if needed  I appreciate the opportunity to take part in Bless's care. Please do not hesitate to contact me with questions.  Sincerely,   Prudy Feeler, MD Allergy/Immunology [facility]

## 2017-03-27 NOTE — Patient Instructions (Signed)
Asthma    - Pulmicort 180- 2 puffs twice a day to prevent coughing or wheezing.  Once you are back to your baseline can go back to daily dosing.   - Montelukast 10 mg once a day for a coughing or wheezing  - Ventolin 2 puffs every 4-6 hours if needed for wheezing, coughing, shortness of breath or chest tightness.  Monitor frequency of use.  Asthma control goals:   Full participation in all desired activities (may need albuterol before activity)  Albuterol use two time or less a week on average (not counting use with activity)  Cough interfering with sleep two time or less a month  Oral steroids no more than once a year  No hospitalizations  Allergic rhinitis   - Zyrtec 10 mg once a day for runny nose  - Rhinocort 1-2 spray per nostril twice a day for stuffy nose  - Montelukast as above  Venom allergy  - continue venom injections every 8 weeks  - If you have an insect sting take Benadryl 50 mg every 4 hours and if you have life-threatening symptoms inject him with EpiPen 0.3 mg  Follow-up yearly or sooner if needed

## 2017-04-16 MED FILL — MONTELUKAST SOD 10 MG TAB: 10 | 90 days supply | Qty: 90 | Fill #2

## 2017-04-17 MED FILL — BEELITH TABLET: 362-20 | 90 days supply | Qty: 90 | Fill #0

## 2017-05-06 ENCOUNTER — Telehealth: Payer: Self-pay | Admitting: Pediatrics

## 2017-05-06 NOTE — Telephone Encounter (Signed)
Pt husband made bill payment today, please send an itemized receipt. Thanks

## 2017-05-09 ENCOUNTER — Ambulatory Visit (INDEPENDENT_AMBULATORY_CARE_PROVIDER_SITE_OTHER): Payer: 59

## 2017-05-09 DIAGNOSIS — T63441D Toxic effect of venom of bees, accidental (unintentional), subsequent encounter: Secondary | ICD-10-CM | POA: Diagnosis not present

## 2017-05-09 MED FILL — POTASSIUM CITRATE ER 10 MEQ: 10 MEQ | 90 days supply | Qty: 360 | Fill #1

## 2017-05-09 MED FILL — OMEPRAZOLE DR 20 MG CAPSULE: 20 | 90 days supply | Qty: 90 | Fill #1

## 2017-05-09 NOTE — Telephone Encounter (Signed)
Itemized statement mailed on 05-09-17.

## 2017-05-29 DIAGNOSIS — J310 Chronic rhinitis: Secondary | ICD-10-CM | POA: Diagnosis not present

## 2017-05-29 DIAGNOSIS — J342 Deviated nasal septum: Secondary | ICD-10-CM | POA: Diagnosis not present

## 2017-05-29 DIAGNOSIS — T485X1A Poisoning by other anti-common-cold drugs, accidental (unintentional), initial encounter: Secondary | ICD-10-CM | POA: Diagnosis not present

## 2017-05-29 DIAGNOSIS — J343 Hypertrophy of nasal turbinates: Secondary | ICD-10-CM | POA: Diagnosis not present

## 2017-05-29 MED FILL — METHYLPREDNISOLONE 4 MG TAB: 4 | 6 days supply | Qty: 21 | Fill #0

## 2017-06-02 MED FILL — METOPROLOL SUCC ER 100 MG T: 100 | 90 days supply | Qty: 90 | Fill #1

## 2017-06-13 ENCOUNTER — Encounter (HOSPITAL_COMMUNITY): Payer: Self-pay

## 2017-06-13 ENCOUNTER — Encounter (HOSPITAL_COMMUNITY)
Admission: RE | Admit: 2017-06-13 | Discharge: 2017-06-13 | Disposition: A | Discharge status: Home / Self Care | Payer: 59 | Source: Ambulatory Visit | Attending: Otolaryngology | Admitting: Otolaryngology

## 2017-06-13 ENCOUNTER — Other Ambulatory Visit: Payer: Self-pay | Admitting: Otolaryngology

## 2017-06-13 DIAGNOSIS — Z01812 Encounter for preprocedural laboratory examination: Secondary | ICD-10-CM | POA: Diagnosis not present

## 2017-06-13 DIAGNOSIS — Z01810 Encounter for preprocedural cardiovascular examination: Secondary | ICD-10-CM | POA: Insufficient documentation

## 2017-06-13 DIAGNOSIS — I10 Essential (primary) hypertension: Secondary | ICD-10-CM | POA: Insufficient documentation

## 2017-06-13 HISTORY — DX: Polycystic ovarian syndrome: E28.2

## 2017-06-13 HISTORY — DX: Genetic susceptibility to other disease: Z15.89

## 2017-06-13 LAB — HCG, SERUM, QUALITATIVE: Preg, Serum: NEGATIVE

## 2017-06-13 LAB — BASIC METABOLIC PANEL
ANION GAP: 11 (ref 5–15)
BUN: 10 mg/dL (ref 6–20)
CO2: 22 mmol/L (ref 22–32)
Calcium: 9.1 mg/dL (ref 8.9–10.3)
Chloride: 106 mmol/L (ref 101–111)
Creatinine, Ser: 0.69 mg/dL (ref 0.44–1.00)
GFR calc Af Amer: >60 mL/min (ref >60)
GFR calc non Af Amer: >60 mL/min (ref >60)
GLUCOSE: 84 mg/dL (ref 65–99)
POTASSIUM: 4.2 mmol/L (ref 3.5–5.1)
Sodium: 139 mmol/L (ref 135–145)

## 2017-06-13 LAB — CBC
HEMATOCRIT: 37.7 % (ref 36.0–46.0)
Hemoglobin: 12.5 g/dL (ref 12.0–15.0)
MCH: 28.1 pg (ref 26.0–34.0)
MCHC: 33.2 g/dL (ref 30.0–36.0)
MCV: 84.7 fL (ref 78.0–100.0)
Platelets: 445 10*3/uL — ABNORMAL HIGH (ref 150–400)
RBC: 4.45 MIL/uL (ref 3.87–5.11)
RDW: 13 % (ref 11.5–15.5)
WBC: 11.5 10*3/uL — AB (ref 4.0–10.5)

## 2017-06-13 NOTE — Pre-Procedure Instructions (Addendum)
DRUSILLA WAMPOLE  06/13/2017      [place], [place] - [address] [place] [address] Phone: [phone] Fax: [phone]    Your procedure is scheduled on Friday, June 20, 2017.  Report to [facility] Admitting at 0700 A.M.  Call this number if you have problems the morning of surgery:  [phone]   Remember:  Do not eat food or drink liquids after midnight.   Continue all other medications as directed by your physician except follow these instructions about you medications   Take these medicines the morning of surgery with A SIP OF WATER:  Albuterol inhaler - if needed   Follow your doctors instructions regarding your Aspirin.  If no instructions were given by the doctor you will need to call the office to get instructions.  Your pre admission RN will also call for those instructions    7 days prior to surgery STOP taking any Aleve, Naproxen, Ibuprofen, Motrin, Advil, Goody's, BC's, all herbal medications, fish oil, and all vitamins   Do not wear jewelry, make-up or nail polish.  Do not wear lotions, powders, or perfumes, or deodorant.  Do not shave 48 hours prior to surgery.   Do not bring valuables to the hospital.  [facility] is not responsible for any belongings or valuables.  Contacts, eyeglasses, dentures or bridgework may not be worn into surgery.  Leave your suitcase in the car.  After surgery it may be brought to your room.  For patients admitted to the hospital, discharge time will be determined by your treatment team.  Patients discharged the day of surgery will not be allowed to drive home.   Name and phone number of your driver:    Special instructions:   [facility]- Preparing For Surgery  Before surgery, you can play an important role. Because skin is not sterile, your skin needs to be as free of germs as possible. You can reduce the number of germs on your skin by  washing with CHG (chlorahexidine gluconate) Soap before surgery.  CHG is an antiseptic cleaner which kills germs and bonds with the skin to continue killing germs even after washing.  Please do not use if you have an allergy to CHG or antibacterial soaps. If your skin becomes reddened/irritated stop using the CHG.  Do not shave (including legs and underarms) for at least 48 hours prior to first CHG shower. It is OK to shave your face.  Please follow these instructions carefully.   1. Shower the NIGHT BEFORE SURGERY and the MORNING OF SURGERY with CHG.   2. If you chose to wash your hair, wash your hair first as usual with your normal shampoo.  3. After you shampoo, rinse your hair and body thoroughly to remove the shampoo.  4. Use CHG as you would any other liquid soap. You can apply CHG directly to the skin and wash gently with a scrungie or a clean washcloth.   5. Apply the CHG Soap to your body ONLY FROM THE NECK DOWN.  Do not use on open wounds or open sores. Avoid contact with your eyes, ears, mouth and genitals (private parts). Wash genitals (private parts) with your normal soap.  6. Wash thoroughly, paying special attention to the area where your surgery will be performed.  7. Thoroughly rinse your body with warm water from the neck down.  8. DO NOT shower/wash with your normal soap after  using and rinsing off the CHG Soap.  9. Pat yourself dry with a CLEAN TOWEL.   10. Wear CLEAN PAJAMAS   11. Place CLEAN SHEETS on your bed the night of your first shower and DO NOT SLEEP WITH PETS.    Day of Surgery: Shower as stated above Do not apply any deodorants/lotions.  Please wear clean clothes to the hospital/surgery center.      Please read over the following fact sheets that you were given. Pain Booklet, Coughing and Deep Breathing, MRSA Information and Surgical Site Infection Prevention

## 2017-06-19 MED ORDER — CEFAZOLIN SODIUM 10 G IJ SOLR
3.0000 g | INTRAMUSCULAR | Status: AC
  Administered 2017-06-20: 3 g via INTRAVENOUS
  Filled 2017-06-19: qty 3000

## 2017-06-20 ENCOUNTER — Ambulatory Visit (HOSPITAL_COMMUNITY): Payer: 59 | Admitting: Anesthesiology

## 2017-06-20 ENCOUNTER — Encounter (HOSPITAL_COMMUNITY): Payer: Self-pay | Admitting: Surgery

## 2017-06-20 ENCOUNTER — Encounter (HOSPITAL_COMMUNITY)
Admission: RE | Disposition: A | Discharge status: Home / Self Care | Payer: Self-pay | Source: Ambulatory Visit | Attending: Otolaryngology

## 2017-06-20 ENCOUNTER — Ambulatory Visit (HOSPITAL_COMMUNITY)
Admission: RE | Admit: 2017-06-20 | Discharge: 2017-06-20 | Disposition: A | Discharge status: Home / Self Care | Payer: 59 | Source: Ambulatory Visit | Attending: Otolaryngology | Admitting: Otolaryngology

## 2017-06-20 DIAGNOSIS — J342 Deviated nasal septum: Secondary | ICD-10-CM | POA: Diagnosis not present

## 2017-06-20 DIAGNOSIS — E785 Hyperlipidemia, unspecified: Secondary | ICD-10-CM | POA: Insufficient documentation

## 2017-06-20 DIAGNOSIS — K219 Gastro-esophageal reflux disease without esophagitis: Secondary | ICD-10-CM | POA: Insufficient documentation

## 2017-06-20 DIAGNOSIS — Z91038 Other insect allergy status: Secondary | ICD-10-CM | POA: Insufficient documentation

## 2017-06-20 DIAGNOSIS — I10 Essential (primary) hypertension: Secondary | ICD-10-CM | POA: Diagnosis not present

## 2017-06-20 DIAGNOSIS — Z6841 Body Mass Index (BMI) 40.0 and over, adult: Secondary | ICD-10-CM | POA: Diagnosis not present

## 2017-06-20 DIAGNOSIS — Z887 Allergy status to serum and vaccine status: Secondary | ICD-10-CM | POA: Insufficient documentation

## 2017-06-20 DIAGNOSIS — Z87442 Personal history of urinary calculi: Secondary | ICD-10-CM | POA: Diagnosis not present

## 2017-06-20 DIAGNOSIS — Z91030 Bee allergy status: Secondary | ICD-10-CM | POA: Diagnosis not present

## 2017-06-20 DIAGNOSIS — E282 Polycystic ovarian syndrome: Secondary | ICD-10-CM | POA: Insufficient documentation

## 2017-06-20 DIAGNOSIS — Z7982 Long term (current) use of aspirin: Secondary | ICD-10-CM | POA: Diagnosis not present

## 2017-06-20 DIAGNOSIS — J45909 Unspecified asthma, uncomplicated: Secondary | ICD-10-CM | POA: Diagnosis not present

## 2017-06-20 DIAGNOSIS — Z79899 Other long term (current) drug therapy: Secondary | ICD-10-CM | POA: Diagnosis not present

## 2017-06-20 DIAGNOSIS — J343 Hypertrophy of nasal turbinates: Secondary | ICD-10-CM | POA: Diagnosis not present

## 2017-06-20 HISTORY — PX: NASAL SEPTOPLASTY W/ TURBINOPLASTY: SHX2070

## 2017-06-20 SURGERY — SEPTOPLASTY, NOSE, WITH NASAL TURBINATE REDUCTION
Anesthesia: General | Site: Nose | Laterality: Bilateral

## 2017-06-20 MED ORDER — CHLORHEXIDINE GLUCONATE CLOTH 2 % EX PADS: 6.0000 | MEDICATED_PAD | Freq: Once | CUTANEOUS | Status: DC

## 2017-06-20 MED ORDER — HYDROCODONE-ACETAMINOPHEN 5-325 MG PO TABS
ORAL_TABLET | ORAL | Status: AC
  Filled 2017-06-20: qty 1

## 2017-06-20 MED ORDER — PROPOFOL 10 MG/ML IV BOLUS
INTRAVENOUS | Status: AC
  Filled 2017-06-20: qty 20

## 2017-06-20 MED ORDER — LIDOCAINE-EPINEPHRINE 1 %-1:100000 IJ SOLN
INTRAMUSCULAR | Status: DC | PRN
  Administered 2017-06-20: 20 mL

## 2017-06-20 MED ORDER — LIDOCAINE-EPINEPHRINE 1 %-1:100000 IJ SOLN
INTRAMUSCULAR | Status: AC
  Filled 2017-06-20: qty 1

## 2017-06-20 MED ORDER — ONDANSETRON HCL 4 MG/2ML IJ SOLN
INTRAMUSCULAR | Status: DC | PRN
Start: 2017-06-20 — End: 2017-06-20
  Administered 2017-06-20: 4 mg via INTRAVENOUS

## 2017-06-20 MED ORDER — FENTANYL CITRATE (PF) 100 MCG/2ML IJ SOLN
INTRAMUSCULAR | Status: DC | PRN
  Administered 2017-06-20: 50 ug via INTRAVENOUS
  Administered 2017-06-20: 100 ug via INTRAVENOUS
  Administered 2017-06-20: 50 ug via INTRAVENOUS

## 2017-06-20 MED ORDER — MUPIROCIN 2 % EX OINT
TOPICAL_OINTMENT | CUTANEOUS | Status: DC | PRN
  Administered 2017-06-20: 1 via NASAL

## 2017-06-20 MED ORDER — PROMETHAZINE HCL 25 MG/ML IJ SOLN
6.2500 mg | INTRAMUSCULAR | Status: DC | PRN
  Administered 2017-06-20: 6.25 mg via INTRAVENOUS

## 2017-06-20 MED ORDER — METOCLOPRAMIDE HCL 5 MG/ML IJ SOLN
INTRAMUSCULAR | Status: AC
  Filled 2017-06-20: qty 2

## 2017-06-20 MED ORDER — 0.9 % SODIUM CHLORIDE (POUR BTL) OPTIME
TOPICAL | Status: DC | PRN
  Administered 2017-06-20: 1000 mL

## 2017-06-20 MED ORDER — NEOSTIGMINE METHYLSULFATE 10 MG/10ML IV SOLN
INTRAVENOUS | Status: DC | PRN
  Administered 2017-06-20: 5 mg via INTRAVENOUS

## 2017-06-20 MED ORDER — FENTANYL CITRATE (PF) 250 MCG/5ML IJ SOLN
INTRAMUSCULAR | Status: AC
  Filled 2017-06-20: qty 5

## 2017-06-20 MED ORDER — PROPOFOL 10 MG/ML IV BOLUS
INTRAVENOUS | Status: DC | PRN
  Administered 2017-06-20: 200 mg via INTRAVENOUS

## 2017-06-20 MED ORDER — SODIUM CHLORIDE 0.9 % IR SOLN
Status: DC | PRN
  Administered 2017-06-20: 1000 mL

## 2017-06-20 MED ORDER — LACTATED RINGERS IV SOLN
INTRAVENOUS | Status: DC
  Administered 2017-06-20: 07:00:00 via INTRAVENOUS

## 2017-06-20 MED ORDER — HYDROCODONE-ACETAMINOPHEN 5-325 MG PO TABS
1.0000 | ORAL_TABLET | Freq: Once | ORAL | Status: AC
  Administered 2017-06-20: 1 via ORAL

## 2017-06-20 MED ORDER — MIDAZOLAM HCL 5 MG/5ML IJ SOLN
INTRAMUSCULAR | Status: DC | PRN
  Administered 2017-06-20 (×2): 2 mg via INTRAVENOUS

## 2017-06-20 MED ORDER — MUPIROCIN 2 % EX OINT
TOPICAL_OINTMENT | CUTANEOUS | Status: AC
  Filled 2017-06-20: qty 22

## 2017-06-20 MED ORDER — GLYCOPYRROLATE 0.2 MG/ML IJ SOLN
INTRAMUSCULAR | Status: DC | PRN
  Administered 2017-06-20: .8 mg via INTRAVENOUS

## 2017-06-20 MED ORDER — MIDAZOLAM HCL 2 MG/2ML IJ SOLN
INTRAMUSCULAR | Status: AC
  Filled 2017-06-20: qty 2

## 2017-06-20 MED ORDER — DEXAMETHASONE SODIUM PHOSPHATE 10 MG/ML IJ SOLN
INTRAMUSCULAR | Status: DC | PRN
  Administered 2017-06-20: 10 mg via INTRAVENOUS

## 2017-06-20 MED ORDER — EPHEDRINE SULFATE 50 MG/ML IJ SOLN
INTRAMUSCULAR | Status: DC | PRN
  Administered 2017-06-20: 10 mg via INTRAVENOUS

## 2017-06-20 MED ORDER — SCOPOLAMINE 1 MG/3DAYS TD PT72
MEDICATED_PATCH | TRANSDERMAL | Status: AC
  Filled 2017-06-20: qty 1

## 2017-06-20 MED ORDER — PHENYLEPHRINE HCL 10 MG/ML IJ SOLN
INTRAMUSCULAR | Status: DC | PRN
  Administered 2017-06-20 (×3): 80 ug via INTRAVENOUS

## 2017-06-20 MED ORDER — HYDROMORPHONE HCL 1 MG/ML IJ SOLN
0.2500 mg | INTRAMUSCULAR | Status: DC | PRN
  Administered 2017-06-20 (×2): 0.5 mg via INTRAVENOUS

## 2017-06-20 MED ORDER — OXYMETAZOLINE HCL 0.05 % NA SOLN
NASAL | Status: DC | PRN
  Administered 2017-06-20: 1

## 2017-06-20 MED ORDER — LIDOCAINE HCL (CARDIAC) 20 MG/ML IV SOLN
INTRAVENOUS | Status: DC | PRN
  Administered 2017-06-20: 80 mg via INTRAVENOUS

## 2017-06-20 MED ORDER — ROCURONIUM BROMIDE 100 MG/10ML IV SOLN
INTRAVENOUS | Status: DC | PRN
  Administered 2017-06-20: 30 mg via INTRAVENOUS

## 2017-06-20 MED ORDER — LACTATED RINGERS IV SOLN
INTRAVENOUS | Status: DC | PRN
  Administered 2017-06-20: 09:00:00 via INTRAVENOUS

## 2017-06-20 MED ORDER — OXYMETAZOLINE HCL 0.05 % NA SOLN
NASAL | Status: AC
  Filled 2017-06-20: qty 15

## 2017-06-20 MED ORDER — CEPHALEXIN 500 MG PO CAPS: 500.0000 mg | ORAL_CAPSULE | Freq: Three times a day (TID) | ORAL | 0 refills | Status: AC

## 2017-06-20 MED ORDER — HYDROCODONE-ACETAMINOPHEN 5-325 MG PO TABS: 1.0000 | ORAL_TABLET | Freq: Four times a day (QID) | ORAL | 0 refills | Status: AC | PRN

## 2017-06-20 MED ORDER — SCOPOLAMINE 1 MG/3DAYS TD PT72
1.0000 | MEDICATED_PATCH | Freq: Once | TRANSDERMAL | Status: DC
  Administered 2017-06-20: 1.5 mg via TRANSDERMAL

## 2017-06-20 MED ORDER — METOCLOPRAMIDE HCL 5 MG/ML IJ SOLN
10.0000 mg | Freq: Once | INTRAMUSCULAR | Status: AC
  Administered 2017-06-20: 10 mg via INTRAVENOUS

## 2017-06-20 MED ORDER — HYDROMORPHONE HCL 1 MG/ML IJ SOLN
INTRAMUSCULAR | Status: AC
  Administered 2017-06-20: 0.5 mg via INTRAVENOUS
  Filled 2017-06-20: qty 1

## 2017-06-20 MED ORDER — PROMETHAZINE HCL 25 MG/ML IJ SOLN
INTRAMUSCULAR | Status: AC
  Administered 2017-06-20: 6.25 mg via INTRAVENOUS
  Filled 2017-06-20: qty 1

## 2017-06-20 MED ORDER — SUCCINYLCHOLINE CHLORIDE 20 MG/ML IJ SOLN
INTRAMUSCULAR | Status: DC | PRN
  Administered 2017-06-20: 120 mg via INTRAVENOUS

## 2017-06-20 MED FILL — HYDROCODON-APAP 5-325: 5-325 | 4 days supply | Qty: 30 | Fill #0

## 2017-06-20 MED FILL — CEPHALEXIN 500 MG CAPSULE: 500 | 5 days supply | Qty: 15 | Fill #0

## 2017-06-20 SURGICAL SUPPLY — 27 items
BLADE INF TURB ROT M4 2 5PK (BLADE) ×2 IMPLANT
CANISTER SUCT 3000ML PPV (MISCELLANEOUS) ×2 IMPLANT
CRADLE DONUT ADULT HEAD (MISCELLANEOUS) ×1 IMPLANT
DRAPE HALF SHEET 40X57 (DRAPES) ×2 IMPLANT
DRSG TELFA 3X8 NADH (GAUZE/BANDAGES/DRESSINGS) ×2 IMPLANT
GLOVE BIO SURGEON STRL SZ7.5 (GLOVE) ×2 IMPLANT
GLOVE BIOGEL PI IND STRL 6.5 (GLOVE) IMPLANT
GLOVE BIOGEL PI INDICATOR 6.5 (GLOVE) ×1
GLOVE SURG SS PI 6.5 STRL IVOR (GLOVE) ×1 IMPLANT
GOWN STRL REUS W/ TWL LRG LVL3 (GOWN DISPOSABLE) ×2 IMPLANT
GOWN STRL REUS W/TWL LRG LVL3 (GOWN DISPOSABLE) ×4
KIT BASIN OR (CUSTOM PROCEDURE TRAY) ×2 IMPLANT
KIT ROOM TURNOVER OR (KITS) ×2 IMPLANT
NDL HYPO 25GX1X1/2 BEV (NEEDLE) IMPLANT
NDL HYPO 25X1 1.5 SAFETY (NEEDLE) ×1 IMPLANT
NEEDLE HYPO 25GX1X1/2 BEV (NEEDLE) IMPLANT
NEEDLE HYPO 25X1 1.5 SAFETY (NEEDLE) ×2 IMPLANT
NS IRRIG 1000ML POUR BTL (IV SOLUTION) ×2 IMPLANT
PAD ARMBOARD 7.5X6 YLW CONV (MISCELLANEOUS) ×2 IMPLANT
PAD DRESSING TELFA 3X8 NADH (GAUZE/BANDAGES/DRESSINGS) ×1 IMPLANT
PATTIES SURGICAL .5 X3 (DISPOSABLE) ×2 IMPLANT
SPLINT NASAL DOYLE BI-VL (GAUZE/BANDAGES/DRESSINGS) ×2 IMPLANT
SUT CHROMIC 4 0 PS 2 18 (SUTURE) ×2 IMPLANT
SUT CHROMIC GUT 2 0 PS 2 27 (SUTURE) ×2 IMPLANT
SUT ETHILON 5 0 CL P 3 (SUTURE) ×1 IMPLANT
TRAY ENT MC OR (CUSTOM PROCEDURE TRAY) ×2 IMPLANT
TUBE CONNECTING 12X1/4 (SUCTIONS) ×1 IMPLANT

## 2017-06-20 NOTE — Anesthesia Postprocedure Evaluation (Signed)
Anesthesia Post Note  Patient: Rachel Hooper  Procedure(s) Performed: Procedure(s) (LRB): NASAL SEPTOPLASTY WITH BILATERAL TURBINATE REDUCTION (Bilateral)     Patient location during evaluation: PACU Anesthesia Type: General Level of consciousness: awake and alert Pain management: pain level controlled Vital Signs Assessment: post-procedure vital signs reviewed and stable Respiratory status: spontaneous breathing, nonlabored ventilation, respiratory function stable and patient connected to nasal cannula oxygen Cardiovascular status: blood pressure returned to baseline and stable Postop Assessment: no signs of nausea or vomiting Anesthetic complications: no    Last Vitals:  Vitals:   06/20/17 1115 06/20/17 1122  BP:  (!) 146/94  Pulse: 91 88  Resp: 14 14  Temp:  36.7 C  SpO2: 98% 99%    Last Pain:  Vitals:   06/20/17 1122  TempSrc:   PainSc: 3                  Christyana Corwin,JAMES TERRILL

## 2017-06-20 NOTE — Anesthesia Procedure Notes (Signed)
Procedure Name: Intubation Date/Time: 06/20/2017 9:20 AM Performed by: Clearnce Sorrel Pre-anesthesia Checklist: Patient identified, Emergency Drugs available, Suction available, Patient being monitored and Timeout performed Patient Re-evaluated:Patient Re-evaluated prior to induction Oxygen Delivery Method: Circle system utilized Preoxygenation: Pre-oxygenation with 100% oxygen Induction Type: IV induction Ventilation: Mask ventilation without difficulty Laryngoscope Size: Mac and 3 Grade View: Grade I Tube type: Oral Tube size: 7.0 mm Number of attempts: 1 Airway Equipment and Method: Stylet Placement Confirmation: ETT inserted through vocal cords under direct vision,  positive ETCO2 and breath sounds checked- equal and bilateral Secured at: 22 cm Tube secured with: Tape Dental Injury: Teeth and Oropharynx as per pre-operative assessment

## 2017-06-20 NOTE — H&P (Signed)
Rachel Hooper is an 36 y.o. female.   Chief Complaint: Nasal obstruction HPI: 36 year old female with few years of nasal obstruction due to turbinate hypertrophy and septal deviation.  She was using topical decongestant regularly and has been able to abstain for the past few weeks.  She presents for surgical management.  Past Medical History:  Diagnosis Date  . Asthma   . Endometriosis   . GERD (gastroesophageal reflux disease)   . History of kidney stones   . Hydrosalpinx    left  . Hyperlipidemia    diet controlled, no med  . Hypertension   . PAI-1 4G/4G genotype   . PCOS (polycystic ovarian syndrome)   . PONV (postoperative nausea and vomiting)    severe  . Seasonal allergies     Past Surgical History:  Procedure Laterality Date  . ANTERIOR CRUCIATE LIGAMENT REPAIR Right 1995  . CHROMOPERTUBATION N/A 05/03/2015   Procedure: CHROMOPERTUBATION;  Surgeon: Governor Specking, MD;  Location: [facility];  Service: Gynecology;  Laterality: N/A;  . EXTRACORPOREAL SHOCK WAVE LITHOTRIPSY  2008  . LAPAROSCOPY N/A 03/28/2015   Procedure: LAPAROSCOPY DIAGNOSTIC;  Surgeon: Brien Few, MD;  Location: [facility];  Service: Gynecology;  Laterality: N/A;  . LAPAROSCOPY N/A 05/03/2015   Procedure: LAPAROSCOPY, EXCISION ENDOMETRIOSIS, ENTERO LYSIS, WITH ENDOMETRIAL BIOPSY;  Surgeon: Governor Specking, MD;  Location: [facility];  Service: Gynecology;  Laterality: N/A;  . UNILATERAL SALPINGECTOMY Left 05/03/2015   Procedure: LEFT SALPINGECTOMY;  Surgeon: Governor Specking, MD;  Location: [facility];  Service: Gynecology;  Laterality: Left;  . WISDOM TOOTH EXTRACTION      Family History  Problem Relation Age of Onset  . Allergic rhinitis Mother   . Asthma Mother   . Eczema Mother   . Eczema Maternal Grandfather   . Angioedema Neg Hx   . Immunodeficiency Neg Hx   . Urticaria Neg Hx    Social History:  reports that she has never smoked. She has  never used smokeless tobacco. She reports that she does not drink alcohol or use drugs.  Allergies:  Allergies  Allergen Reactions  . Bee Venom Anaphylaxis    And wasp venom  . Pneumococcal Vaccines Anaphylaxis  . Mixed Vespid Venom   . Wasp Venom     Medications Prior to Admission  Medication Sig Dispense Refill  . aspirin 81 MG tablet Take 81 mg by mouth daily.    . budesonide (PULMICORT FLEXHALER) 180 MCG/ACT inhaler TWO PUFFS TWICE A DAY TO PREVENT COUGH OR WHEEZE. RINSE, GARGLE AND SPIT AFTER USE. (Patient taking differently: Inhale 2 puffs into the lungs every evening. ) 3 each 1  . budesonide (RHINOCORT ALLERGY) 32 MCG/ACT nasal spray ONE SPRAY EACH NOSTRIL TWICE A DAY FOR NASAL CONGESTION OR DRAINAGE. (Patient taking differently: Place 2 sprays into both nostrils 2 (two) times daily. ) 8.6 g 5  . cetirizine (ZYRTEC) 10 MG tablet ONE TABLET ONCE A DAY FOR RUNNY NOSE OR ITCHING. (Patient taking differently: Take 10 mg by mouth at bedtime. ) 90 tablet 2  . magnesium oxide-pyridoxine (BEELITH) 362-20 MG TABS Take 1 tablet by mouth every evening.     . metoprolol succinate (TOPROL-XL) 100 MG 24 hr tablet Take 100 mg by mouth at bedtime. Take with or immediately following a meal.    . montelukast (SINGULAIR) 10 MG tablet ONE TABLET ONCE A DAY FOR COUGH OR WHEEZE. (Patient taking differently: Take 10 mg by mouth at bedtime. ) 90  tablet 2  . phenylephrine (NEO-SYNEPHRINE) 1 % nasal spray Place 3 drops into both nostrils every evening. Reported on 03/04/2016    . potassium citrate (UROCIT-K) 10 MEQ (1080 MG) SR tablet Take 20 mEq by mouth every evening. Reported on 03/04/2016    . albuterol (PROVENTIL HFA;VENTOLIN HFA) 108 (90 Base) MCG/ACT inhaler Inhale 2 puffs into the lungs every 6 (six) hours as needed for wheezing or shortness of breath. 3 Inhaler 1  . EPINEPHrine (EPIPEN 2-PAK) 0.3 mg/0.3 mL IJ SOAJ injection USE AS DIRECTED FOR SEVERE ALLERGIC REACTION. (Patient taking differently:  Inject 0.3 mg into the muscle once. USE AS DIRECTED FOR SEVERE ALLERGIC REACTION.) 2 Device 2  . oxyCODONE-acetaminophen (PERCOCET) 7.5-325 MG per tablet Take 1 tablet by mouth every 4 (four) hours as needed. (Patient not taking: Reported on 03/27/2017) 20 tablet 0  . tamsulosin (FLOMAX) 0.4 MG CAPS capsule Take 1 capsule (0.4 mg total) by mouth daily after supper. (Patient not taking: Reported on 03/04/2016) 15 capsule 0    No results found for this or any previous visit (from the past 48 hour(s)). No results found.  Review of Systems  All other systems reviewed and are negative.   Blood pressure (!) 136/103, pulse 92, temperature 98.1 F (36.7 C), temperature source Oral, resp. rate 18, last menstrual period 06/07/2017, SpO2 98 %. Physical Exam  Constitutional: She is oriented to person, place, and time. She appears well-developed and well-nourished. No distress.  HENT:  Head: Normocephalic and atraumatic.  Right Ear: External ear normal.  Left Ear: External ear normal.  Mouth/Throat: Oropharynx is clear and moist.  Medial crus of left lower cartilage protrudes into ala, septum mildly deviated to right, moderate turbinate hypertrophy.  Eyes: Pupils are equal, round, and reactive to light. Conjunctivae and EOM are normal.  Neck: Normal range of motion. Neck supple.  Cardiovascular: Normal rate.   Respiratory: Effort normal.  Musculoskeletal: Normal range of motion.  Neurological: She is alert and oriented to person, place, and time. No cranial nerve deficit.  Skin: Skin is warm and dry.  Psychiatric: She has a normal mood and affect. Her behavior is normal. Judgment and thought content normal.     Assessment/Plan Septal deviation and turbinate hypertrophy To OR for septoplasty and turbinate reduction.  Melida Quitter, MD 06/20/2017, 8:54 AM

## 2017-06-20 NOTE — Transfer of Care (Signed)
Immediate Anesthesia Transfer of Care Note  Patient: Rachel Hooper  Procedure(s) Performed: Procedure(s): NASAL SEPTOPLASTY WITH BILATERAL TURBINATE REDUCTION (Bilateral)  Patient Location: PACU  Anesthesia Type:General  Level of Consciousness: awake, alert  and oriented  Airway & Oxygen Therapy: Patient Spontanous Breathing and Patient connected to face mask oxygen  Post-op Assessment: Report given to RN and Post -op Vital signs reviewed and stable  Post vital signs: Reviewed and stable  Last Vitals:  Vitals:   06/20/17 0713 06/20/17 1021  BP: (!) 136/103   Pulse: 92 (!) 109  Resp: 18 16  Temp: 36.7 C 36.7 C  SpO2: 98% 100%    Last Pain:  Vitals:   06/20/17 0713  TempSrc: Oral      Patients Stated Pain Goal: 6 (59/53/96 7289)  Complications: No apparent anesthesia complications

## 2017-06-20 NOTE — Anesthesia Preprocedure Evaluation (Signed)
Anesthesia Evaluation  Patient identified by MRN, date of birth, ID band Patient awake    Reviewed: Allergy & Precautions, NPO status , Patient's Chart, lab work & pertinent test results  History of Anesthesia Complications (+) PONV  Airway Mallampati: II       Dental no notable dental hx.    Pulmonary asthma ,    breath sounds clear to auscultation       Cardiovascular hypertension,  Rhythm:Regular Rate:Normal     Neuro/Psych    GI/Hepatic GERD  ,  Endo/Other  Morbid obesity  Renal/GU      Musculoskeletal   Abdominal (+) + obese,   Peds  Hematology   Anesthesia Other Findings   Reproductive/Obstetrics                             Anesthesia Physical Anesthesia Plan  ASA: III  Anesthesia Plan: General   Post-op Pain Management:    Induction: Intravenous  PONV Risk Score and Plan: 4 or greater and Ondansetron, Dexamethasone, Midazolam, Scopolamine patch - Pre-op and Treatment may vary due to age or medical condition  Airway Management Planned: Oral ETT  Additional Equipment:   Intra-op Plan:   Post-operative Plan: Extubation in OR  Informed Consent: I have reviewed the patients History and Physical, chart, labs and discussed the procedure including the risks, benefits and alternatives for the proposed anesthesia with the patient or authorized representative who has indicated his/her understanding and acceptance.   Dental advisory given  Plan Discussed with: CRNA  Anesthesia Plan Comments:         Anesthesia Quick Evaluation

## 2017-06-20 NOTE — Brief Op Note (Signed)
06/20/2017  10:12 AM  PATIENT:  Rachel Hooper  36 y.o. female  PRE-OPERATIVE DIAGNOSIS:  septal deviation and turbinate hypertrophy  POST-OPERATIVE DIAGNOSIS:  septal deviation and turbinate hypertrophy  PROCEDURE:  Procedure(s): NASAL SEPTOPLASTY WITH BILATERAL TURBINATE REDUCTION (Bilateral)  SURGEON:  Surgeon(s) and Role:    Melida Quitter, MD - Primary  PHYSICIAN ASSISTANT:   ASSISTANTS: none   ANESTHESIA:   general  EBL:  Total I/O In: -  Out: 10 [Blood:10]  BLOOD ADMINISTERED:none  DRAINS: none   LOCAL MEDICATIONS USED:  LIDOCAINE   SPECIMEN:  No Specimen  DISPOSITION OF SPECIMEN:  N/A  COUNTS:  YES  TOURNIQUET:  * No tourniquets in log *  DICTATION: .Other Dictation: Dictation Number [id number]  PLAN OF CARE: Discharge to home after PACU  PATIENT DISPOSITION:  PACU - hemodynamically stable.   Delay start of Pharmacological VTE agent (>24hrs) due to surgical blood loss or risk of bleeding: yes

## 2017-06-21 ENCOUNTER — Encounter (HOSPITAL_COMMUNITY): Payer: Self-pay | Admitting: Otolaryngology

## 2017-06-23 NOTE — Op Note (Signed)
NAMEJACARIA, Rachel Hooper             ACCOUNT NO.:  [id number]  MEDICAL RECORD NO.:  [id number]  LOCATION:                                 FACILITY:  PHYSICIAN:  Onnie Graham, MD     DATE OF BIRTH:  [birth year]  DATE OF PROCEDURE:  06/20/2017 DATE OF DISCHARGE:                              OPERATIVE REPORT   PREOPERATIVE DIAGNOSES: 1. Nasal septal deviation. 2. Inferior turbinate hypertrophy.  POSTOPERATIVE DIAGNOSES: 1. Nasal septal deviation. 2. Inferior turbinate hypertrophy.  PROCEDURE: 1. Nasal septoplasty. 2. Bilateral inferior turbinate reduction.  SURGEON:  Onnie Graham, MD.  ANESTHESIA:  General endotracheal anesthesia.  COMPLICATIONS:  None.  INDICATION:  The patient is a 36 year old female with several-year history of nasal obstruction.  She was found to have deviation of her nasal septum and turbinate hypertrophy, but also was using nasal decongestant spray frequently.  She was able to stop using the spray for the last few weeks and has experienced a little bit of improvement, but still feels significant obstruction.  Thus, she presents to the operating room for surgical management.  FINDINGS:  Both inferior turbinates were enlarged.  The nasal septum was deviated to the left very anteriorly and caudally and then to the right through much of the quadrangular cartilage.  DESCRIPTION OF PROCEDURE:  The patient was identified in the holding room and informed consent having been obtained including discussion of risks, benefits, alternatives, the patient was brought to the operative suite and put on the operating table in supine position.  Anesthesia was induced, and the patient was intubated by the Anesthesia team without difficulty.  The patient was given intravenous antibiotics during the case.  The eyes were taped closed, and the midface was prepped and draped in a sterile fashion.  Afrin-soaked pledgets were placed on both sides of the nose for several  minutes and then removed.  The nasal septum was injected on both sides using 1% lidocaine with 1:100,000 epinephrine.  A left-sided Killian incision was made and a subperichondrial flap elevated along the left side beyond the bony cartilaginous junction.  With the septum skeleton well exposed, attention was first directed to the very caudal extent of the septum where the caudal edge deviated toward the left side into the ala.  This edge was trimmed off with a 15 blade scalpel allowing a better midline position for the caudal end of the septum.  Bony cartilaginous junction was then divided, and posterior portion of bone and cartilage was removed in a piecemeal fashion.  The quadrangular cartilage was then trimmed with a sizable piece removed from the posterior portion leaving a generous caudal and dorsal strut.  This allowed better midline position of the septum.  The inferior cartilage was then further removed using a Soil scientist, exposing the maxillary spine.  Soft tissues were elevated off the spine, and the spine was then removed using an osteotome.  This allowed for very midline position of the septum.  The 15 blade was then used to make a hole in the inferior left septal flap to allow for drainage.  At this point, the inferior turbinates were injected with 1% lidocaine with 1:100,000 epinephrine.  Stab incisions  were made at the anterior extent of each inferior turbinate, and soft tissues were then elevated off the underlying bone using a Soil scientist.  The turbinate blade on the microdebrider was then used to remove soft tissues keeping the overlying mucosa and underlying bone intact.  The soft tissue was redraped, and the turbinates were lateralized.  Afrin-soaked pledgets were placed in both sides of the nose.  Finally, attention was directed to the nasal ala where the left ala still had soft tissue extending into it from the columellar region. Thus, a 5-0 clear nylon suture  was used to tack down the soft tissue toward the midline by suturing through the soft tissue just opposite of the caudal extent of the septum and then including the left-sided alar soft tissues with a subcutaneous suture.  At this point, Doyle splints were placed on both sides of the nose, coated with Bactroban ointment. The Killian incision was closed with 4-0 chromic suture in a simple interrupted fashion.  The stents were placed in the proper position and secured with a single 2-0 chromic mattress suture.  The throat was then suctioned, and the patient was then returned to anesthesia for wake-up after being cleaned off.  She was extubated and moved to the recovery room in stable condition.     Onnie Graham, MD     DDB/MEDQ  D:  06/20/2017  T:  06/20/2017  Job:  [id number]  cc:   Onnie Graham, MD's office

## 2017-07-01 ENCOUNTER — Ambulatory Visit (INDEPENDENT_AMBULATORY_CARE_PROVIDER_SITE_OTHER): Payer: 59 | Admitting: *Deleted

## 2017-07-01 DIAGNOSIS — T63441D Toxic effect of venom of bees, accidental (unintentional), subsequent encounter: Secondary | ICD-10-CM | POA: Diagnosis not present

## 2017-07-01 MED FILL — VENTOLIN HFA 90 MCG INHALER: 108 (90 BAS | 25 days supply | Qty: 18 | Fill #0

## 2017-07-01 MED FILL — MONTELUKAST SOD 10 MG TAB: 10 | 90 days supply | Qty: 90 | Fill #0

## 2017-07-01 MED FILL — PULMICORT 180 MCG FLEXHALER: 180 | 30 days supply | Qty: 1 | Fill #1

## 2017-07-02 MED FILL — metFORMIN HCL 1000 MG TABS: 1000 | 30 days supply | Qty: 60 | Fill #3

## 2017-07-17 MED FILL — BEELITH TABLET: 362-20 | 30 days supply | Qty: 30 | Fill #1

## 2017-07-17 MED FILL — ATORVASTATIN 20 MG TABLET: 20 | 90 days supply | Qty: 90 | Fill #1

## 2017-08-01 MED FILL — OMEPRAZOLE 20 MG CAP: 20 | 90 days supply | Qty: 90 | Fill #0

## 2017-08-01 MED FILL — POTASSIUM CITRATE ER 10 MEQ: 10 MEQ | 90 days supply | Qty: 360 | Fill #2

## 2017-08-15 MED FILL — metFORMIN HCL 1000 MG TABS: 1000 | 30 days supply | Qty: 60 | Fill #0

## 2017-08-15 MED FILL — MEDROXYPROGESTERONE 10 MG T: 10 | 5 days supply | Qty: 5 | Fill #0

## 2017-08-18 DIAGNOSIS — E282 Polycystic ovarian syndrome: Secondary | ICD-10-CM | POA: Diagnosis not present

## 2017-08-18 DIAGNOSIS — Z319 Encounter for procreative management, unspecified: Secondary | ICD-10-CM | POA: Diagnosis not present

## 2017-08-18 DIAGNOSIS — Z3141 Encounter for fertility testing: Secondary | ICD-10-CM | POA: Diagnosis not present

## 2017-08-19 DIAGNOSIS — N96 Recurrent pregnancy loss: Secondary | ICD-10-CM | POA: Diagnosis not present

## 2017-08-19 MED FILL — miSOPROStol 200 MCG TABS: 200 | 4 days supply | Qty: 4 | Fill #0

## 2017-08-19 MED FILL — LARIN 21 1-20 TABLET: 1-20 | 21 days supply | Qty: 21 | Fill #0

## 2017-08-22 MED FILL — METOPROLOL SUCC ER 100 MG T: 100 | 90 days supply | Qty: 90 | Fill #2

## 2017-08-22 MED FILL — PULMICORT 180 MCG FLEXHALER: 180 | 90 days supply | Qty: 3 | Fill #2

## 2017-08-22 MED FILL — VENTOLIN HFA 90 MCG INHALER: 108 (90 BAS | 75 days supply | Qty: 54 | Fill #1

## 2017-08-22 MED FILL — BEELITH TABLET: 362-20 | 90 days supply | Qty: 90 | Fill #2

## 2017-08-29 DIAGNOSIS — N96 Recurrent pregnancy loss: Secondary | ICD-10-CM | POA: Diagnosis not present

## 2017-08-29 DIAGNOSIS — Q51810 Arcuate uterus: Secondary | ICD-10-CM | POA: Diagnosis not present

## 2017-08-29 DIAGNOSIS — N840 Polyp of corpus uteri: Secondary | ICD-10-CM | POA: Diagnosis not present

## 2017-08-29 MED FILL — BD NEEDLES 22GX1.5: 22G X 1-1/2 | 30 days supply | Qty: 30 | Fill #0

## 2017-08-29 MED FILL — METHYLPREDNISOLONE 4 MG TAB: 4 | 4 days supply | Qty: 16 | Fill #0

## 2017-08-29 MED FILL — ESTRADIOL 2 MG TABLET: 2 | 30 days supply | Qty: 60 | Fill #0

## 2017-08-29 MED FILL — OXYCODONE-ACETAMINOPHEN 5-3: 5-325 | 2 days supply | Qty: 10 | Fill #0

## 2017-08-29 MED FILL — BD NEEDLES 22GX1.5": 22G X 1-1/2 | 30 days supply | Qty: 30 | Fill #0

## 2017-08-29 MED FILL — BD 3 ML SYRINGE 18GX1-1/2": 18G X 1-1/2 | 30 days supply | Qty: 30 | Fill #0

## 2017-08-29 MED FILL — LETROZOLE 2.5 MG TABLET: 2.5 | 15 days supply | Qty: 15 | Fill #0

## 2017-08-29 MED FILL — ESTRADIOL 0.1 MG PATCH: 0.1 | 28 days supply | Qty: 8 | Fill #0

## 2017-08-29 MED FILL — diazePAM 10 MG TABS: 10 | 1 days supply | Qty: 1 | Fill #0

## 2017-08-29 MED FILL — BD 3 ML SYRINGE 18GX1-1/2: 18G X 1-1/2 | 30 days supply | Qty: 30 | Fill #0

## 2017-08-29 MED FILL — HEPARIN NA 5,000 UNITS/ML V: 5000 | 30 days supply | Qty: 60 | Fill #0

## 2017-08-29 MED FILL — PROGESTERONE OIL 50 MG/ML V: 50 | 30 days supply | Qty: 30 | Fill #0

## 2017-09-01 ENCOUNTER — Ambulatory Visit (INDEPENDENT_AMBULATORY_CARE_PROVIDER_SITE_OTHER): Payer: 59 | Admitting: *Deleted

## 2017-09-01 DIAGNOSIS — E7800 Pure hypercholesterolemia, unspecified: Secondary | ICD-10-CM | POA: Diagnosis not present

## 2017-09-01 DIAGNOSIS — I10 Essential (primary) hypertension: Secondary | ICD-10-CM | POA: Diagnosis not present

## 2017-09-01 DIAGNOSIS — F419 Anxiety disorder, unspecified: Secondary | ICD-10-CM | POA: Diagnosis not present

## 2017-09-01 DIAGNOSIS — T63441D Toxic effect of venom of bees, accidental (unintentional), subsequent encounter: Secondary | ICD-10-CM | POA: Diagnosis not present

## 2017-09-01 DIAGNOSIS — Z0000 Encounter for general adult medical examination without abnormal findings: Secondary | ICD-10-CM | POA: Diagnosis not present

## 2017-09-01 DIAGNOSIS — K219 Gastro-esophageal reflux disease without esophagitis: Secondary | ICD-10-CM | POA: Diagnosis not present

## 2017-09-08 DIAGNOSIS — E7800 Pure hypercholesterolemia, unspecified: Secondary | ICD-10-CM | POA: Diagnosis not present

## 2017-09-08 DIAGNOSIS — I10 Essential (primary) hypertension: Secondary | ICD-10-CM | POA: Diagnosis not present

## 2017-09-09 MED FILL — MEDROXYPROGESTERONE 10 MG T: 10 | 5 days supply | Qty: 5 | Fill #0

## 2017-09-15 DIAGNOSIS — E282 Polycystic ovarian syndrome: Secondary | ICD-10-CM | POA: Diagnosis not present

## 2017-09-16 DIAGNOSIS — E282 Polycystic ovarian syndrome: Secondary | ICD-10-CM | POA: Diagnosis not present

## 2017-09-27 DIAGNOSIS — Z3183 Encounter for assisted reproductive fertility procedure cycle: Secondary | ICD-10-CM | POA: Diagnosis not present

## 2017-09-27 DIAGNOSIS — E282 Polycystic ovarian syndrome: Secondary | ICD-10-CM | POA: Diagnosis not present

## 2017-09-29 MED FILL — diazePAM 10 MG TABS: 10 | 1 days supply | Qty: 1 | Fill #0

## 2017-09-29 MED FILL — MONTELUKAST SOD 10 MG TAB: 10 | 90 days supply | Qty: 90 | Fill #1

## 2017-09-29 MED FILL — BD 3 ML SYRINGE 18GX1-1/2: 18G X 1-1/2 | 30 days supply | Qty: 30 | Fill #1

## 2017-09-29 MED FILL — HEPARIN NA 5,000 UNITS/ML V: 5000 | 30 days supply | Qty: 60 | Fill #1

## 2017-09-29 MED FILL — metFORMIN HCL 1000 MG TABS: 1000 | 90 days supply | Qty: 180 | Fill #1

## 2017-09-29 MED FILL — ESTRADIOL 2 MG TABLET: 2 | 30 days supply | Qty: 60 | Fill #1

## 2017-09-29 MED FILL — ESTRADIOL 0.1 MG PATCH: 0.1 | 28 days supply | Qty: 8 | Fill #1

## 2017-09-29 MED FILL — BD NEEDLES 22GX1.5: 22G X 1-1/2 | 30 days supply | Qty: 30 | Fill #1

## 2017-09-29 MED FILL — BD 3 ML SYRINGE 18GX1-1/2": 18G X 1-1/2 | 30 days supply | Qty: 30 | Fill #1

## 2017-09-29 MED FILL — LETROZOLE 2.5 MG TABLET: 2.5 | 15 days supply | Qty: 15 | Fill #1

## 2017-09-29 MED FILL — ENDOMETRIN 100 MG SUPP: 100 | 31 days supply | Qty: 63 | Fill #0

## 2017-09-29 MED FILL — CRINONE 8 % GEL: 8 | 30 days supply | Qty: 51 | Fill #0

## 2017-09-29 MED FILL — BD NEEDLES 22GX1.5": 22G X 1-1/2 | 30 days supply | Qty: 30 | Fill #1

## 2017-10-04 DIAGNOSIS — Z3183 Encounter for assisted reproductive fertility procedure cycle: Secondary | ICD-10-CM | POA: Diagnosis not present

## 2017-10-04 DIAGNOSIS — E282 Polycystic ovarian syndrome: Secondary | ICD-10-CM | POA: Diagnosis not present

## 2017-10-11 DIAGNOSIS — E282 Polycystic ovarian syndrome: Secondary | ICD-10-CM | POA: Diagnosis not present

## 2017-10-13 DIAGNOSIS — Z3201 Encounter for pregnancy test, result positive: Secondary | ICD-10-CM | POA: Diagnosis not present

## 2017-10-17 MED FILL — OMEPRAZOLE 20 MG CAP: 20 | 90 days supply | Qty: 90 | Fill #1

## 2017-10-17 MED FILL — ATORVASTATIN 20 MG TABLET: 20 | 90 days supply | Qty: 90 | Fill #2

## 2017-10-17 MED FILL — POTASSIUM CITRATE ER 10 MEQ: 10 MEQ | 90 days supply | Qty: 360 | Fill #3

## 2017-11-10 ENCOUNTER — Ambulatory Visit (INDEPENDENT_AMBULATORY_CARE_PROVIDER_SITE_OTHER): Payer: 59

## 2017-11-10 DIAGNOSIS — T63441D Toxic effect of venom of bees, accidental (unintentional), subsequent encounter: Secondary | ICD-10-CM | POA: Diagnosis not present

## 2017-11-25 MED FILL — METOPROLOL SUCCINATE ER 100: 100 | 90 days supply | Qty: 90 | Fill #3

## 2017-11-27 MED FILL — BEELITH TABLET: 362-20 | 90 days supply | Qty: 90 | Fill #3

## 2017-12-26 MED FILL — metFORMIN HCL 1000 MG TABS: 1000 | 60 days supply | Qty: 120 | Fill #2

## 2017-12-26 MED FILL — MONTELUKAST SOD 10 MG TAB: 10 | 90 days supply | Qty: 90 | Fill #2

## 2018-01-05 MED FILL — PIOGLITAZONE HCL 30 MG TAB: 30 | 30 days supply | Qty: 30 | Fill #0

## 2018-01-06 ENCOUNTER — Ambulatory Visit (INDEPENDENT_AMBULATORY_CARE_PROVIDER_SITE_OTHER): Payer: 59 | Admitting: *Deleted

## 2018-01-06 DIAGNOSIS — T63441D Toxic effect of venom of bees, accidental (unintentional), subsequent encounter: Secondary | ICD-10-CM | POA: Diagnosis not present

## 2018-01-30 MED FILL — OMEPRAZOLE 20 MG CAP: 20 | 90 days supply | Qty: 90 | Fill #0

## 2018-01-30 MED FILL — ATORVASTATIN 20 MG TABLET: 20 | 90 days supply | Qty: 90 | Fill #3

## 2018-02-09 DIAGNOSIS — R82992 Hyperoxaluria: Secondary | ICD-10-CM | POA: Diagnosis not present

## 2018-02-09 DIAGNOSIS — N809 Endometriosis, unspecified: Secondary | ICD-10-CM | POA: Diagnosis not present

## 2018-02-09 DIAGNOSIS — N3010 Interstitial cystitis (chronic) without hematuria: Secondary | ICD-10-CM | POA: Diagnosis not present

## 2018-02-09 DIAGNOSIS — K760 Fatty (change of) liver, not elsewhere classified: Secondary | ICD-10-CM | POA: Diagnosis not present

## 2018-02-09 DIAGNOSIS — N200 Calculus of kidney: Secondary | ICD-10-CM | POA: Diagnosis not present

## 2018-02-09 DIAGNOSIS — R1909 Other intra-abdominal and pelvic swelling, mass and lump: Secondary | ICD-10-CM | POA: Diagnosis not present

## 2018-02-09 MED FILL — BEELITH TABLET: 362-20 | 90 days supply | Qty: 90 | Fill #0

## 2018-02-19 MED FILL — METOPROLOL SUCCINATE ER 100: 100 | 90 days supply | Qty: 90 | Fill #0

## 2018-03-04 ENCOUNTER — Ambulatory Visit (INDEPENDENT_AMBULATORY_CARE_PROVIDER_SITE_OTHER): Payer: 59 | Admitting: *Deleted

## 2018-03-04 DIAGNOSIS — T63441D Toxic effect of venom of bees, accidental (unintentional), subsequent encounter: Secondary | ICD-10-CM | POA: Diagnosis not present

## 2018-03-20 DIAGNOSIS — E282 Polycystic ovarian syndrome: Secondary | ICD-10-CM | POA: Diagnosis not present

## 2018-03-20 DIAGNOSIS — F419 Anxiety disorder, unspecified: Secondary | ICD-10-CM | POA: Diagnosis not present

## 2018-03-20 DIAGNOSIS — I10 Essential (primary) hypertension: Secondary | ICD-10-CM | POA: Diagnosis not present

## 2018-03-20 DIAGNOSIS — Z01419 Encounter for gynecological examination (general) (routine) without abnormal findings: Secondary | ICD-10-CM | POA: Diagnosis not present

## 2018-03-20 DIAGNOSIS — E7800 Pure hypercholesterolemia, unspecified: Secondary | ICD-10-CM | POA: Diagnosis not present

## 2018-03-20 DIAGNOSIS — K219 Gastro-esophageal reflux disease without esophagitis: Secondary | ICD-10-CM | POA: Diagnosis not present

## 2018-03-20 DIAGNOSIS — Z1151 Encounter for screening for human papillomavirus (HPV): Secondary | ICD-10-CM | POA: Diagnosis not present

## 2018-03-20 DIAGNOSIS — Z6841 Body Mass Index (BMI) 40.0 and over, adult: Secondary | ICD-10-CM | POA: Diagnosis not present

## 2018-03-20 DIAGNOSIS — R609 Edema, unspecified: Secondary | ICD-10-CM | POA: Diagnosis not present

## 2018-03-20 MED FILL — LARIN 1.5 MG-30 MCG TABLET: 1.5-30 | 21 days supply | Qty: 21 | Fill #0

## 2018-04-09 ENCOUNTER — Other Ambulatory Visit: Payer: Self-pay | Admitting: Allergy

## 2018-04-09 DIAGNOSIS — J301 Allergic rhinitis due to pollen: Secondary | ICD-10-CM

## 2018-04-09 DIAGNOSIS — J4540 Moderate persistent asthma, uncomplicated: Secondary | ICD-10-CM

## 2018-04-09 MED FILL — MONTELUKAST SOD 10 MG TAB: 10 | 30 days supply | Qty: 30 | Fill #0

## 2018-04-09 NOTE — Telephone Encounter (Signed)
I sent in a refill request for Montelukast Sod 10 MG #30 nr

## 2018-04-20 MED FILL — ATORVASTATIN 40 MG TABLET: 40 | 90 days supply | Qty: 90 | Fill #0

## 2018-04-27 MED FILL — OMEPRAZOLE 20 MG CAP: 20 | 90 days supply | Qty: 90 | Fill #1

## 2018-04-27 MED FILL — LARIN 1.5 MG-30 MCG TABLET: 1.5-30 | 21 days supply | Qty: 21 | Fill #1

## 2018-05-06 ENCOUNTER — Ambulatory Visit (INDEPENDENT_AMBULATORY_CARE_PROVIDER_SITE_OTHER): Payer: 59 | Admitting: Family Medicine

## 2018-05-06 ENCOUNTER — Encounter: Payer: Self-pay | Admitting: Family Medicine

## 2018-05-06 VITALS — BP 108/72 | HR 104 | Resp 18

## 2018-05-06 DIAGNOSIS — B029 Zoster without complications: Secondary | ICD-10-CM | POA: Diagnosis not present

## 2018-05-06 DIAGNOSIS — R21 Rash and other nonspecific skin eruption: Secondary | ICD-10-CM | POA: Diagnosis not present

## 2018-05-06 DIAGNOSIS — J301 Allergic rhinitis due to pollen: Secondary | ICD-10-CM | POA: Diagnosis not present

## 2018-05-06 DIAGNOSIS — B0230 Zoster ocular disease, unspecified: Secondary | ICD-10-CM | POA: Insufficient documentation

## 2018-05-06 DIAGNOSIS — T63441D Toxic effect of venom of bees, accidental (unintentional), subsequent encounter: Secondary | ICD-10-CM

## 2018-05-06 DIAGNOSIS — J4540 Moderate persistent asthma, uncomplicated: Secondary | ICD-10-CM | POA: Diagnosis not present

## 2018-05-06 DIAGNOSIS — B0233 Zoster keratitis: Secondary | ICD-10-CM | POA: Diagnosis not present

## 2018-05-06 DIAGNOSIS — B0239 Other herpes zoster eye disease: Secondary | ICD-10-CM | POA: Diagnosis not present

## 2018-05-06 DIAGNOSIS — T63441A Toxic effect of venom of bees, accidental (unintentional), initial encounter: Secondary | ICD-10-CM | POA: Insufficient documentation

## 2018-05-06 MED ORDER — BUDESONIDE 180 MCG/ACT IN AEPB: INHALATION_SPRAY | RESPIRATORY_TRACT | 1 refills | Status: DC

## 2018-05-06 MED ORDER — BUDESONIDE 32 MCG/ACT NA SUSP: NASAL | 5 refills | Status: DC

## 2018-05-06 MED ORDER — EPINEPHRINE 0.3 MG/0.3ML IJ SOAJ: 0.3000 mg | Freq: Once | INTRAMUSCULAR | 1 refills | Status: AC

## 2018-05-06 MED ORDER — ALBUTEROL SULFATE HFA 108 (90 BASE) MCG/ACT IN AERS: 2.0000 | INHALATION_SPRAY | Freq: Four times a day (QID) | RESPIRATORY_TRACT | 1 refills | Status: DC | PRN

## 2018-05-06 MED ORDER — MONTELUKAST SODIUM 10 MG PO TABS
10.0000 mg | ORAL_TABLET | Freq: Every day | ORAL | 0 refills | Status: DC
Start: 2018-05-06 — End: 2018-05-08

## 2018-05-06 NOTE — Patient Instructions (Signed)
Asthma - Continue Pulmicort 180- 2 puffs twice a day to prevent coughing or wheezing.  Once you are back to your baseline can go back to daily dosing.   - Montelukast 10 mg once a day for a coughing or wheezing  - Ventolin 2 puffs every 4-6 hours if needed for wheezing, coughing, shortness of breath or chest tightness.  Monitor frequency of use.  Asthma control goals:   Full participation in all desired activities (may need albuterol before activity)  Albuterol use two time or less a week on average (not counting use with activity)  Cough interfering with sleep two time or less a month  Oral steroids no more than once a year  No hospitalizations  Allergic rhinitis   - Zyrtec 10 mg once a day for runny nose  - Rhinocort 1-2 spray per nostril twice a day for stuffy nose  - Montelukast as above  Venom allergy  - continue venom injections every 8 weeks  - If you have an insect sting take Benadryl 50 mg every 4 hours and if you have life-threatening symptoms inject him with EpiPen 0.3 mg  Facial and scalp rash - Keep your follow up appointment with your primary care provider today  Follow-up yearly or sooner if needed

## 2018-05-06 NOTE — Progress Notes (Signed)
[address] [place] [address] Dept: [phone]  FOLLOW UP NOTE  Patient ID: Rachel Hooper, female    DOB: [birth year]  Age: 37 y.o. MRN: [id number] Date of Office Visit: 05/06/2018  Assessment  Chief Complaint: Asthma (asthma is doing very well, needs refills. ?new onset rash above right eye. causing some vision issues. burns, itches, spread from eye over top of head. )  HPI Rachel Hooper is a 37 year old female who presents to the clinic for a follow up visit. She was last seen in this clinic on 03/27/2017 for evaluation of asthma, allergic rhinitis, venom allergy, and reflux. At that time, she was continues on Pulmicort 180- 2 puffs twice a day, montelukast, and albuterol as needed for asthma control. She continues cetirizine and Rhinocort for allergic rhinitis. She continued venom injections every 8 weeks with no adverse events.  At today's visit, she reports her asthma has been well controlled with no shortness of breath, wheeze, or cough with activity and rest. She continues Pulmicort 180- 1- 2 puffs once a day, montelukast 10 mg once a day, and albuterol as needed. She reports she has been using Pulmicort 2 puffs daily since the weekend. She does not need to use her albuterol inhaler.   Allergic rhinitis is reported as well controlled with cetirizine 10 mg once a day and Rhinocort nasal spray.   She continues venom immunotherapy injections at 8 week intervals at the maintenance dose with no local or systemic reactions. She has access to an EpiPen in case of life threatening reactions.   She reports that she developed a blister behind her right ear over the weekend which lead into a rash that now covers the right side of her face beginning at her right eye and traveling along her cheek. The rash ends at the midsagital point of her scalp and does not cross the center. The rash is described as painful and exquisitely tender to touch. She reports a sensation in her right  eye as dry and painful. She noted a reddened area on the superior area of the conjunctiva that appeared this morning. Korena reports tenderness on preauricular and postauricular lymph nodes, sore throat, and a right ear ache. She has tried [employer], Systayne eye drops, and Doxycycline with no improvement in the rash or relief from pain. She reports an increased amount of stress over the last several days. She reports she has an appointment with her primary care provider later this afternoon.   Her current medications are listed in the chart.  Drug Allergies:  Allergies  Allergen Reactions  . Bee Venom Anaphylaxis    And wasp venom  . Pneumococcal Vaccines Anaphylaxis  . Mixed Vespid Venom   . Wasp Venom     Physical Exam: BP 108/72   Pulse (!) 104   Resp 18   SpO2 98%    Physical Exam  Constitutional: She is oriented to person, place, and time. She appears well-developed and well-nourished.  HENT:  Head: Normocephalic.  Right Ear: External ear normal.  Left Ear: External ear normal.  Nose: Nose normal.  Mouth/Throat: Oropharynx is clear and moist.  Eyes:    Neck: Normal range of motion. Neck supple.  Cardiovascular: Normal rate, regular rhythm and normal heart sounds.  No murmur noted  Pulmonary/Chest: Effort normal and breath sounds normal.  Lungs clear to auscultation  Musculoskeletal: Normal range of motion.  Neurological: She is alert and oriented to person, place, and time.  Skin: Skin is warm.  Scattered erythematous raised rash that now covers the right side of her face beginning at her right eye and traveling along her cheek. The rash ends at the midsagital point of her scalp and does not cross the center.  Psychiatric: She has a normal mood and affect. Her behavior is normal. Judgment and thought content normal.    Diagnostics: Spirometry deferred due to right sided facial pain and right eye pain.   Assessment and Plan: 1. Moderate persistent  asthma, uncomplicated   2. Seasonal allergic rhinitis due to pollen   3. Toxic effect of venom of bees, unintentional, subsequent encounter   4. Herpes zoster with ophthalmic complication, unspecified herpes zoster eye disease     Meds ordered this encounter  Medications  . albuterol (PROVENTIL HFA;VENTOLIN HFA) 108 (90 Base) MCG/ACT inhaler    Sig: Inhale 2 puffs into the lungs every 6 (six) hours as needed for wheezing or shortness of breath.    Dispense:  3 Inhaler    Refill:  1  . budesonide (PULMICORT FLEXHALER) 180 MCG/ACT inhaler    Sig: TWO PUFFS TWICE A DAY TO PREVENT COUGH OR WHEEZE. RINSE, GARGLE AND SPIT AFTER USE.    Dispense:  3 each    Refill:  1  . budesonide (RHINOCORT ALLERGY) 32 MCG/ACT nasal spray    Sig: ONE SPRAY EACH NOSTRIL TWICE A DAY FOR NASAL CONGESTION OR DRAINAGE.    Dispense:  8.6 g    Refill:  5  . EPINEPHrine (EPIPEN 2-PAK) 0.3 mg/0.3 mL IJ SOAJ injection    Sig: Inject 0.3 mLs (0.3 mg total) into the muscle once for 1 dose. USE AS DIRECTED FOR SEVERE ALLERGIC REACTION.    Dispense:  2 Device    Refill:  1    Please dispense [employer] brand generic only. Thank you.  . montelukast (SINGULAIR) 10 MG tablet    Sig: Take 1 tablet (10 mg total) by mouth at bedtime.    Dispense:  30 tablet    Refill:  0    Patient Instructions  Asthma - Continue Pulmicort 180- 2 puffs twice a day to prevent coughing or wheezing.  Once you are back to your baseline can go back to daily dosing.   - Montelukast 10 mg once a day for a coughing or wheezing  - Ventolin 2 puffs every 4-6 hours if needed for wheezing, coughing, shortness of breath or chest tightness.  Monitor frequency of use.  Asthma control goals:   Full participation in all desired activities (may need albuterol before activity)  Albuterol use two time or less a week on average (not counting use with activity)  Cough interfering with sleep two time or less a month  Oral steroids no more than once a  year  No hospitalizations  Allergic rhinitis   - Zyrtec 10 mg once a day for runny nose  - Rhinocort 1-2 spray per nostril twice a day for stuffy nose  - Montelukast as above  Venom allergy  - continue venom injections every 8 weeks  - If you have an insect sting take Benadryl 50 mg every 4 hours and if you have life-threatening symptoms inject him with EpiPen 0.3 mg  Facial and scalp rash - Keep your follow up appointment with your primary care provider today  Follow-up yearly or sooner if needed   Return in about 1 year (around 05/07/2019), or if symptoms worsen or fail to improve.    Thank you for the  opportunity to care for this patient.  Please do not hesitate to contact me with questions.  Gareth Morgan, FNP [facility]

## 2018-05-07 MED FILL — EPINEPHRINE 0.3 MG AUTO-INJ: 0.3 | 30 days supply | Qty: 2 | Fill #0

## 2018-05-07 MED FILL — GABAPENTIN 100 MG CAP: 100 | 30 days supply | Qty: 60 | Fill #0

## 2018-05-07 MED FILL — VENTOLIN HFA 90 MCG INHALER: 108 (90 BAS | 75 days supply | Qty: 54 | Fill #0

## 2018-05-07 MED FILL — traMADol HCL 50 MG TABS: 50 | 10 days supply | Qty: 30 | Fill #0

## 2018-05-07 MED FILL — MONTELUKAST SOD 10 MG TAB: 10 | 30 days supply | Qty: 30 | Fill #0

## 2018-05-07 MED FILL — PULMICORT 180 MCG FLEXHALER: 180 | 90 days supply | Qty: 3 | Fill #0

## 2018-05-08 ENCOUNTER — Telehealth: Payer: Self-pay | Admitting: Family Medicine

## 2018-05-08 DIAGNOSIS — B0239 Other herpes zoster eye disease: Secondary | ICD-10-CM | POA: Diagnosis not present

## 2018-05-08 DIAGNOSIS — B0233 Zoster keratitis: Secondary | ICD-10-CM | POA: Diagnosis not present

## 2018-05-08 MED ORDER — MONTELUKAST SODIUM 10 MG PO TABS: 10.0000 mg | ORAL_TABLET | Freq: Every day | ORAL | 2 refills | Status: DC

## 2018-05-08 NOTE — Telephone Encounter (Signed)
Prescription refill sent.

## 2018-05-08 NOTE — Telephone Encounter (Signed)
Pt called and said that the singulair was suppose to be for 3 month supply. [facility] [phone].

## 2018-05-12 DIAGNOSIS — B0239 Other herpes zoster eye disease: Secondary | ICD-10-CM | POA: Diagnosis not present

## 2018-05-12 DIAGNOSIS — B0233 Zoster keratitis: Secondary | ICD-10-CM | POA: Diagnosis not present

## 2018-05-14 MED FILL — POTASSIUM CITRATE ER 10 MEQ: 10 MEQ | 90 days supply | Qty: 180 | Fill #0

## 2018-05-15 ENCOUNTER — Ambulatory Visit (INDEPENDENT_AMBULATORY_CARE_PROVIDER_SITE_OTHER): Payer: 59

## 2018-05-15 DIAGNOSIS — T63441D Toxic effect of venom of bees, accidental (unintentional), subsequent encounter: Secondary | ICD-10-CM

## 2018-05-15 DIAGNOSIS — B0233 Zoster keratitis: Secondary | ICD-10-CM | POA: Diagnosis not present

## 2018-05-15 MED FILL — GABAPENTIN 100 MG CAP: 100 | 30 days supply | Qty: 180 | Fill #0

## 2018-05-15 MED FILL — PREDNISOLONE AC 1% EYE DROP: 1 | 50 days supply | Qty: 10 | Fill #0

## 2018-05-19 MED FILL — METOPROLOL SUCCINATE ER 100: 100 | 90 days supply | Qty: 90 | Fill #1

## 2018-05-21 MED FILL — ESTRADIOL 2 MG TABLET: 2 | 30 days supply | Qty: 60 | Fill #0

## 2018-05-21 MED FILL — METHYLPREDNISOLONE 16 MG TA: 16 | 4 days supply | Qty: 4 | Fill #0

## 2018-05-21 MED FILL — PROGESTERONE OIL 50 MG/ML V: 50 | 15 days supply | Qty: 30 | Fill #0

## 2018-05-21 MED FILL — ESTRADIOL 0.1 MG PATCH: 0.1 | 28 days supply | Qty: 8 | Fill #0

## 2018-05-25 DIAGNOSIS — B0239 Other herpes zoster eye disease: Secondary | ICD-10-CM | POA: Diagnosis not present

## 2018-05-25 DIAGNOSIS — B0232 Zoster iridocyclitis: Secondary | ICD-10-CM | POA: Diagnosis not present

## 2018-05-25 DIAGNOSIS — B0233 Zoster keratitis: Secondary | ICD-10-CM | POA: Diagnosis not present

## 2018-06-02 MED FILL — CONTRAVE ER 8-90 MG TABLET: 8-90 | 30 days supply | Qty: 70 | Fill #0

## 2018-06-02 MED FILL — MONTELUKAST SOD 10 MG TAB: 10 | 90 days supply | Qty: 90 | Fill #0

## 2018-06-03 DIAGNOSIS — Z3183 Encounter for assisted reproductive fertility procedure cycle: Secondary | ICD-10-CM | POA: Diagnosis not present

## 2018-06-05 MED FILL — METHYLPREDNISOLONE 4 MG TAB: 4 | 10 days supply | Qty: 40 | Fill #0

## 2018-06-05 MED FILL — OXYCODONE-ACETAMINOPHEN 5-3: 5-325 | 1 days supply | Qty: 3 | Fill #0

## 2018-06-05 MED FILL — ESTRADIOL 0.1 MG PATCH: 0.1 | 28 days supply | Qty: 8 | Fill #1

## 2018-06-05 MED FILL — diazePAM 10 MG TABS: 10 | 1 days supply | Qty: 1 | Fill #0

## 2018-06-08 MED FILL — BEELITH TABLET: 362-20 | 90 days supply | Qty: 90 | Fill #1

## 2018-06-10 DIAGNOSIS — Z3183 Encounter for assisted reproductive fertility procedure cycle: Secondary | ICD-10-CM | POA: Diagnosis not present

## 2018-06-16 MED FILL — METHYLPREDNISOLONE 4 MG TAB: 4 | 14 days supply | Qty: 56 | Fill #0

## 2018-06-16 MED FILL — ESTRADIOL 2 MG TABLET: 2 | 30 days supply | Qty: 60 | Fill #1

## 2018-06-17 DIAGNOSIS — B0230 Zoster ocular disease, unspecified: Secondary | ICD-10-CM | POA: Diagnosis not present

## 2018-06-17 DIAGNOSIS — Z3200 Encounter for pregnancy test, result unknown: Secondary | ICD-10-CM | POA: Diagnosis not present

## 2018-06-17 DIAGNOSIS — Z3201 Encounter for pregnancy test, result positive: Secondary | ICD-10-CM | POA: Diagnosis not present

## 2018-06-17 DIAGNOSIS — H17821 Peripheral opacity of cornea, right eye: Secondary | ICD-10-CM | POA: Diagnosis not present

## 2018-06-17 MED FILL — PROGESTERONE OIL 50 MG/ML V: 50 | 5 days supply | Qty: 10 | Fill #1

## 2018-06-19 DIAGNOSIS — Z3200 Encounter for pregnancy test, result unknown: Secondary | ICD-10-CM | POA: Diagnosis not present

## 2018-06-23 MED FILL — ESTRADIOL 0.1 MG PATCH: 0.1 | 28 days supply | Qty: 8 | Fill #2

## 2018-06-25 MED FILL — PROGESTERONE OIL 50 MG/ML V: 50 | 10 days supply | Qty: 20 | Fill #2

## 2018-06-30 DIAGNOSIS — O200 Threatened abortion: Secondary | ICD-10-CM | POA: Diagnosis not present

## 2018-06-30 MED FILL — CONTRAVE ER 8-90 MG TABLET: 8-90 | 30 days supply | Qty: 120 | Fill #0

## 2018-07-01 MED FILL — METHYLPREDNISOLONE 4 MG TAB: 4 | 14 days supply | Qty: 56 | Fill #1

## 2018-07-02 DIAGNOSIS — O200 Threatened abortion: Secondary | ICD-10-CM | POA: Diagnosis not present

## 2018-07-06 DIAGNOSIS — O039 Complete or unspecified spontaneous abortion without complication: Secondary | ICD-10-CM | POA: Diagnosis not present

## 2018-07-06 DIAGNOSIS — Z3200 Encounter for pregnancy test, result unknown: Secondary | ICD-10-CM | POA: Diagnosis not present

## 2018-07-06 MED FILL — miSOPROStol 200 MCG TABS: 200 | 1 days supply | Qty: 4 | Fill #0

## 2018-07-06 MED FILL — OXYCODONE-ACETAMINOPHEN 5-3: 5-325 | 1 days supply | Qty: 5 | Fill #0

## 2018-07-07 DIAGNOSIS — Z3200 Encounter for pregnancy test, result unknown: Secondary | ICD-10-CM | POA: Diagnosis not present

## 2018-07-10 ENCOUNTER — Ambulatory Visit: Payer: 59

## 2018-07-13 ENCOUNTER — Ambulatory Visit: Payer: 59

## 2018-07-13 ENCOUNTER — Ambulatory Visit (INDEPENDENT_AMBULATORY_CARE_PROVIDER_SITE_OTHER): Payer: 59

## 2018-07-13 DIAGNOSIS — T63441D Toxic effect of venom of bees, accidental (unintentional), subsequent encounter: Secondary | ICD-10-CM

## 2018-07-13 NOTE — Progress Notes (Signed)
Patient did not want to be put on schedule since she comes a day or so from the time her injection is due.

## 2018-07-21 MED FILL — OMEPRAZOLE 20 MG CPDR: 20 | 90 days supply | Qty: 90 | Fill #2

## 2018-08-17 DIAGNOSIS — H17821 Peripheral opacity of cornea, right eye: Secondary | ICD-10-CM | POA: Diagnosis not present

## 2018-08-17 DIAGNOSIS — H04121 Dry eye syndrome of right lacrimal gland: Secondary | ICD-10-CM | POA: Diagnosis not present

## 2018-08-18 MED FILL — LARIN 1.5 MG-30 MCG TABLET: 1.5-30 | 84 days supply | Qty: 84 | Fill #2

## 2018-08-18 MED FILL — METOPROLOL SUCCINATE ER 100: 100 | 90 days supply | Qty: 90 | Fill #2

## 2018-09-07 ENCOUNTER — Ambulatory Visit (INDEPENDENT_AMBULATORY_CARE_PROVIDER_SITE_OTHER): Payer: 59 | Admitting: *Deleted

## 2018-09-07 DIAGNOSIS — T63441D Toxic effect of venom of bees, accidental (unintentional), subsequent encounter: Secondary | ICD-10-CM | POA: Diagnosis not present

## 2018-09-07 MED FILL — MONTELUKAST SOD 10 MG TAB: 10 | 90 days supply | Qty: 90 | Fill #1

## 2018-09-07 MED FILL — BEELITH TABLET: 362-20 | 90 days supply | Qty: 90 | Fill #2

## 2018-09-07 MED FILL — CONTRAVE ER 8-90 MG TABLET: 8-90 | 30 days supply | Qty: 120 | Fill #1

## 2018-09-11 DIAGNOSIS — E7800 Pure hypercholesterolemia, unspecified: Secondary | ICD-10-CM | POA: Diagnosis not present

## 2018-09-11 DIAGNOSIS — K219 Gastro-esophageal reflux disease without esophagitis: Secondary | ICD-10-CM | POA: Diagnosis not present

## 2018-09-11 DIAGNOSIS — I10 Essential (primary) hypertension: Secondary | ICD-10-CM | POA: Diagnosis not present

## 2018-09-11 DIAGNOSIS — F419 Anxiety disorder, unspecified: Secondary | ICD-10-CM | POA: Diagnosis not present

## 2018-09-11 DIAGNOSIS — Z0000 Encounter for general adult medical examination without abnormal findings: Secondary | ICD-10-CM | POA: Diagnosis not present

## 2018-09-14 DIAGNOSIS — B0052 Herpesviral keratitis: Secondary | ICD-10-CM | POA: Diagnosis not present

## 2018-09-14 DIAGNOSIS — H17821 Peripheral opacity of cornea, right eye: Secondary | ICD-10-CM | POA: Diagnosis not present

## 2018-09-14 MED FILL — VALACYCLOVIR HCL 500 MG TAB: 500 | 30 days supply | Qty: 30 | Fill #0

## 2018-09-14 MED FILL — PREDNISOLONE AC 1% EYE DROP: 1 | 21 days supply | Qty: 15 | Fill #0

## 2018-09-16 MED FILL — ROSUVASTATIN CALCIUM 10 MG: 10 | 90 days supply | Qty: 90 | Fill #0

## 2018-10-05 DIAGNOSIS — H01024 Squamous blepharitis left upper eyelid: Secondary | ICD-10-CM | POA: Diagnosis not present

## 2018-10-05 DIAGNOSIS — H01021 Squamous blepharitis right upper eyelid: Secondary | ICD-10-CM | POA: Diagnosis not present

## 2018-10-05 DIAGNOSIS — H01025 Squamous blepharitis left lower eyelid: Secondary | ICD-10-CM | POA: Diagnosis not present

## 2018-10-05 DIAGNOSIS — H17821 Peripheral opacity of cornea, right eye: Secondary | ICD-10-CM | POA: Diagnosis not present

## 2018-10-05 DIAGNOSIS — H16301 Unspecified interstitial keratitis, right eye: Secondary | ICD-10-CM | POA: Diagnosis not present

## 2018-10-05 DIAGNOSIS — H01022 Squamous blepharitis right lower eyelid: Secondary | ICD-10-CM | POA: Diagnosis not present

## 2018-10-05 DIAGNOSIS — H04123 Dry eye syndrome of bilateral lacrimal glands: Secondary | ICD-10-CM | POA: Diagnosis not present

## 2018-10-05 MED FILL — ROSUVASTATIN CALCIUM 10 MG: 10 | 90 days supply | Qty: 90 | Fill #0

## 2018-10-05 MED FILL — PREDNISOLONE AC 1% EYE DROP: 1 | 75 days supply | Qty: 15 | Fill #0

## 2018-10-08 MED FILL — VALACYCLOVIR HCL 500 MG TAB: 500 | 30 days supply | Qty: 90 | Fill #0

## 2018-10-16 MED FILL — VENTOLIN HFA 90 MCG INHALER: 108 (90 BAS | 75 days supply | Qty: 54 | Fill #1

## 2018-10-16 MED FILL — POTASSIUM CITRATE ER 10 MEQ: 10 MEQ | 90 days supply | Qty: 180 | Fill #1

## 2018-10-16 MED FILL — PULMICORT 180 MCG FLEXHALER: 180 | 90 days supply | Qty: 3 | Fill #1

## 2018-10-16 MED FILL — EPINEPHRINE 0.3 MG AUTO-INJ: 0.3 | 30 days supply | Qty: 2 | Fill #1

## 2018-10-16 MED FILL — OMEPRAZOLE 20 MG CPDR: 20 | 90 days supply | Qty: 90 | Fill #0

## 2018-11-02 ENCOUNTER — Ambulatory Visit (INDEPENDENT_AMBULATORY_CARE_PROVIDER_SITE_OTHER): Payer: 59

## 2018-11-02 DIAGNOSIS — T63441D Toxic effect of venom of bees, accidental (unintentional), subsequent encounter: Secondary | ICD-10-CM | POA: Diagnosis not present

## 2018-11-02 DIAGNOSIS — H01021 Squamous blepharitis right upper eyelid: Secondary | ICD-10-CM | POA: Diagnosis not present

## 2018-11-02 DIAGNOSIS — H01024 Squamous blepharitis left upper eyelid: Secondary | ICD-10-CM | POA: Diagnosis not present

## 2018-11-02 DIAGNOSIS — H04123 Dry eye syndrome of bilateral lacrimal glands: Secondary | ICD-10-CM | POA: Diagnosis not present

## 2018-11-02 DIAGNOSIS — H17821 Peripheral opacity of cornea, right eye: Secondary | ICD-10-CM | POA: Diagnosis not present

## 2018-11-02 DIAGNOSIS — H01022 Squamous blepharitis right lower eyelid: Secondary | ICD-10-CM | POA: Diagnosis not present

## 2018-11-02 DIAGNOSIS — H16301 Unspecified interstitial keratitis, right eye: Secondary | ICD-10-CM | POA: Diagnosis not present

## 2018-11-02 DIAGNOSIS — H01025 Squamous blepharitis left lower eyelid: Secondary | ICD-10-CM | POA: Diagnosis not present

## 2018-11-02 NOTE — Progress Notes (Signed)
Patient did not make appt. 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

## 2018-11-16 MED FILL — METOPROLOL SUCCINATE ER 100: 100 | 90 days supply | Qty: 90 | Fill #0

## 2018-12-02 DIAGNOSIS — H17821 Peripheral opacity of cornea, right eye: Secondary | ICD-10-CM | POA: Diagnosis not present

## 2018-12-02 DIAGNOSIS — H0102B Squamous blepharitis left eye, upper and lower eyelids: Secondary | ICD-10-CM | POA: Diagnosis not present

## 2018-12-02 DIAGNOSIS — H16301 Unspecified interstitial keratitis, right eye: Secondary | ICD-10-CM | POA: Diagnosis not present

## 2018-12-02 DIAGNOSIS — H04123 Dry eye syndrome of bilateral lacrimal glands: Secondary | ICD-10-CM | POA: Diagnosis not present

## 2018-12-02 DIAGNOSIS — H0102A Squamous blepharitis right eye, upper and lower eyelids: Secondary | ICD-10-CM | POA: Diagnosis not present

## 2018-12-02 MED FILL — MONTELUKAST SOD 10 MG TAB: 10 | 90 days supply | Qty: 90 | Fill #2

## 2018-12-02 MED FILL — LARIN 1.5 MG-30 MCG TABLET: 1.5-30 | 84 days supply | Qty: 84 | Fill #3

## 2018-12-02 MED FILL — TIMOLOL 0.5% EYE DROPS: 0.5 | 25 days supply | Qty: 5 | Fill #0

## 2018-12-11 MED FILL — BEELITH TABLET: 362-20 | 90 days supply | Qty: 90 | Fill #3

## 2018-12-14 MED FILL — CONTRAVE ER 8-90 MG TABLET: 8-90 | 30 days supply | Qty: 120 | Fill #0

## 2018-12-30 ENCOUNTER — Ambulatory Visit (INDEPENDENT_AMBULATORY_CARE_PROVIDER_SITE_OTHER): Payer: 59 | Admitting: *Deleted

## 2018-12-30 DIAGNOSIS — T63441D Toxic effect of venom of bees, accidental (unintentional), subsequent encounter: Secondary | ICD-10-CM

## 2019-01-04 ENCOUNTER — Other Ambulatory Visit: Payer: Self-pay | Admitting: Family Medicine

## 2019-01-04 MED FILL — PULMICORT 180 MCG FLEXHALER: 180 | 30 days supply | Qty: 1 | Fill #0 | Status: TO

## 2019-01-04 MED FILL — VALACYCLOVIR HCL 500 MG TAB: 500 | 30 days supply | Qty: 90 | Fill #1

## 2019-01-04 MED FILL — TIMOLOL 0.5% EYE DROPS: 0.5 | 25 days supply | Qty: 5 | Fill #1

## 2019-01-04 NOTE — Telephone Encounter (Signed)
Patient has called needing refill on pulmicort Patient is a yearly patient Asthma is well controlled at this time It was determined by physicians that patient is to be seen yearly instead of every six months Patient is not due for an appt until 04/2019

## 2019-01-08 MED FILL — OMEPRAZOLE 20 MG CPDR: 20 | 90 days supply | Qty: 90 | Fill #0

## 2019-01-12 ENCOUNTER — Other Ambulatory Visit: Payer: Self-pay | Admitting: Allergy

## 2019-01-12 ENCOUNTER — Other Ambulatory Visit: Payer: Self-pay | Admitting: Family Medicine

## 2019-01-12 MED ORDER — EPINEPHRINE 0.3 MG/0.3ML IJ SOAJ: INTRAMUSCULAR | 0 refills | Status: DC

## 2019-01-12 NOTE — Telephone Encounter (Signed)
ASAP!! Please call into [facility] - [phone] They are pulling the last one to hold for the patient Need out script sent to them - ASAP  Please call patient with any questions

## 2019-01-12 NOTE — Telephone Encounter (Signed)
Spoke with patient.  She is aware medications have been sent.

## 2019-01-26 MED FILL — POTASSIUM CITRATE ER 10 MEQ: 10 MEQ | 90 days supply | Qty: 180 | Fill #0

## 2019-01-27 DIAGNOSIS — H17821 Peripheral opacity of cornea, right eye: Secondary | ICD-10-CM | POA: Diagnosis not present

## 2019-01-27 DIAGNOSIS — H04123 Dry eye syndrome of bilateral lacrimal glands: Secondary | ICD-10-CM | POA: Diagnosis not present

## 2019-01-27 DIAGNOSIS — H0102B Squamous blepharitis left eye, upper and lower eyelids: Secondary | ICD-10-CM | POA: Diagnosis not present

## 2019-01-27 DIAGNOSIS — H0102A Squamous blepharitis right eye, upper and lower eyelids: Secondary | ICD-10-CM | POA: Diagnosis not present

## 2019-01-27 DIAGNOSIS — H16301 Unspecified interstitial keratitis, right eye: Secondary | ICD-10-CM | POA: Diagnosis not present

## 2019-02-06 DIAGNOSIS — H40041 Steroid responder, right eye: Secondary | ICD-10-CM | POA: Diagnosis not present

## 2019-02-06 DIAGNOSIS — B0233 Zoster keratitis: Secondary | ICD-10-CM | POA: Diagnosis not present

## 2019-02-06 DIAGNOSIS — H20041 Secondary noninfectious iridocyclitis, right eye: Secondary | ICD-10-CM | POA: Diagnosis not present

## 2019-02-06 DIAGNOSIS — B301 Conjunctivitis due to adenovirus: Secondary | ICD-10-CM | POA: Diagnosis not present

## 2019-02-08 MED FILL — PULMICORT 180 MCG FLEXHALER: 180 | 30 days supply | Qty: 1 | Fill #0

## 2019-02-08 MED FILL — BEELITH TABLET: 362-20 | 60 days supply | Qty: 60 | Fill #0

## 2019-02-10 MED FILL — CONTRAVE ER 8-90 MG TABLET: 8-90 | 30 days supply | Qty: 120 | Fill #0

## 2019-02-22 ENCOUNTER — Other Ambulatory Visit: Payer: Self-pay | Admitting: Family Medicine

## 2019-02-22 NOTE — Telephone Encounter (Signed)
Patient claims she needs a 90 day supply of CINGULAIR sent to [facility] outpatient pharmacy::[phone]

## 2019-03-02 ENCOUNTER — Ambulatory Visit (INDEPENDENT_AMBULATORY_CARE_PROVIDER_SITE_OTHER): Payer: 59

## 2019-03-02 DIAGNOSIS — T63441D Toxic effect of venom of bees, accidental (unintentional), subsequent encounter: Secondary | ICD-10-CM

## 2019-03-08 MED FILL — LARIN 1.5 MG-30 MCG TABLET: 1.5-30 | 21 days supply | Qty: 21 | Fill #0

## 2019-03-16 DIAGNOSIS — I10 Essential (primary) hypertension: Secondary | ICD-10-CM | POA: Diagnosis not present

## 2019-03-16 DIAGNOSIS — F419 Anxiety disorder, unspecified: Secondary | ICD-10-CM | POA: Diagnosis not present

## 2019-03-16 DIAGNOSIS — E7800 Pure hypercholesterolemia, unspecified: Secondary | ICD-10-CM | POA: Diagnosis not present

## 2019-03-16 DIAGNOSIS — K219 Gastro-esophageal reflux disease without esophagitis: Secondary | ICD-10-CM | POA: Diagnosis not present

## 2019-03-17 DIAGNOSIS — H0102A Squamous blepharitis right eye, upper and lower eyelids: Secondary | ICD-10-CM | POA: Diagnosis not present

## 2019-03-17 DIAGNOSIS — H04123 Dry eye syndrome of bilateral lacrimal glands: Secondary | ICD-10-CM | POA: Diagnosis not present

## 2019-03-17 DIAGNOSIS — H0102B Squamous blepharitis left eye, upper and lower eyelids: Secondary | ICD-10-CM | POA: Diagnosis not present

## 2019-03-17 DIAGNOSIS — H17821 Peripheral opacity of cornea, right eye: Secondary | ICD-10-CM | POA: Diagnosis not present

## 2019-03-17 DIAGNOSIS — H16301 Unspecified interstitial keratitis, right eye: Secondary | ICD-10-CM | POA: Diagnosis not present

## 2019-04-05 DIAGNOSIS — I10 Essential (primary) hypertension: Secondary | ICD-10-CM | POA: Diagnosis not present

## 2019-04-05 DIAGNOSIS — E7800 Pure hypercholesterolemia, unspecified: Secondary | ICD-10-CM | POA: Diagnosis not present

## 2019-04-09 DIAGNOSIS — N200 Calculus of kidney: Secondary | ICD-10-CM | POA: Diagnosis not present

## 2019-04-14 DIAGNOSIS — Z6830 Body mass index (BMI) 30.0-30.9, adult: Secondary | ICD-10-CM | POA: Diagnosis not present

## 2019-04-14 DIAGNOSIS — Z01419 Encounter for gynecological examination (general) (routine) without abnormal findings: Secondary | ICD-10-CM | POA: Diagnosis not present

## 2019-04-14 DIAGNOSIS — N809 Endometriosis, unspecified: Secondary | ICD-10-CM | POA: Diagnosis not present

## 2019-04-27 ENCOUNTER — Ambulatory Visit (INDEPENDENT_AMBULATORY_CARE_PROVIDER_SITE_OTHER): Payer: 59 | Admitting: *Deleted

## 2019-04-27 DIAGNOSIS — T63441D Toxic effect of venom of bees, accidental (unintentional), subsequent encounter: Secondary | ICD-10-CM

## 2019-05-06 ENCOUNTER — Other Ambulatory Visit: Payer: Self-pay

## 2019-05-06 ENCOUNTER — Ambulatory Visit (INDEPENDENT_AMBULATORY_CARE_PROVIDER_SITE_OTHER): Payer: 59 | Admitting: Allergy

## 2019-05-06 ENCOUNTER — Encounter: Payer: Self-pay | Admitting: Allergy

## 2019-05-06 ENCOUNTER — Ambulatory Visit: Payer: 59 | Admitting: Allergy

## 2019-05-06 VITALS — BP 118/70 | HR 70 | Temp 98.2°F | Resp 16 | Ht 65.0 in | Wt 168.0 lb

## 2019-05-06 DIAGNOSIS — J301 Allergic rhinitis due to pollen: Secondary | ICD-10-CM | POA: Diagnosis not present

## 2019-05-06 DIAGNOSIS — J4540 Moderate persistent asthma, uncomplicated: Secondary | ICD-10-CM

## 2019-05-06 DIAGNOSIS — T63441D Toxic effect of venom of bees, accidental (unintentional), subsequent encounter: Secondary | ICD-10-CM | POA: Diagnosis not present

## 2019-05-06 NOTE — Patient Instructions (Addendum)
Asthma - Continue Pulmicort 180- 2 puffs twice a day to prevent coughing or wheezing.  - Montelukast 10 mg once a day for a coughing or wheezing - Ventolin 2 puffs every 4-6 hours if needed for wheezing, coughing, shortness of breath or chest tightness.  Monitor frequency of use.  Asthma control goals:   Full participation in all desired activities (may need albuterol before activity)  Albuterol use two time or less a week on average (not counting use with activity)  Cough interfering with sleep two time or less a month  Oral steroids no more than once a year  No hospitalizations  Allergic rhinitis   - Zyrtec 10 mg once a day for runny nose  - Rhinocort 1-2 spray per nostril twice a day for stuffy nose  - Montelukast as above  Venom allergy  - continue venom injections every 8 weeks  - If you have an insect sting take Benadryl 50 mg every 4 hours and if you have life-threatening symptoms inject him with EpiPen 0.3 mg    Follow-up yearly or sooner if needed

## 2019-05-06 NOTE — Progress Notes (Signed)
Follow-up Note  RE: Rachel Hooper MRN: [id number] DOB: 12-11-1980 Date of Office Visit: 05/06/2019   History of present illness: Rachel Hooper is a 38 y.o. female presenting today for follow-up of asthma, allergic rhinitis, venom allergy and ocular shingles.  She was last seen in the office on May 06, 2018 by myself and our nurse practitioner Ambs at which visit she was diagnosed with ocular shingles and was at advised to see her PCP and ophthalmologist.  She states since the year has passed that she continues to struggle and deal with her ocular shingles.  She is now seeing a corneal specialist.  She states that at some point she did have a lot of blurriness to her vision and had some vision loss but that the corneal specialist has helped her to regain her vision.  She states that she cannot have any future eye surgeries due to the shingles and risk of reactivation.  In regards to her asthma she is doing well without any flares.  She has not required any ED or urgent care visits or any hospitalizations.  She continues on Pulmicort 2 puffs once to times a day.  She also continues on Singulair.  She has access to her rescue inhaler and reports that she uses it less than once 2 times a week. With her allergies she states these are pretty controlled with the use of Zyrtec as well as a Rhinocort that she does on a daily basis.  She continues to receive her maintenance dose for her venom immunotherapy.  She is tolerating the injections well every 8 weeks.  She has access to her EpiPen.  Review of systems: Review of Systems  Constitutional: Negative for chills, fever and malaise/fatigue.  HENT: Negative for congestion, ear discharge, nosebleeds, sinus pain and sore throat.   Eyes: Negative for pain, discharge and redness.  Respiratory: Negative for cough, shortness of breath and wheezing.   Cardiovascular: Negative for chest pain.  Gastrointestinal: Negative for abdominal pain,  constipation, diarrhea, heartburn, nausea and vomiting.  Musculoskeletal: Negative for joint pain.  Skin: Negative for itching and rash.  Neurological: Negative for headaches.    All other systems negative unless noted above in HPI  Past medical/social/surgical/family history have been reviewed and are unchanged unless specifically indicated below.  No changes  Medication List: Allergies as of 05/06/2019      Reactions   Bee Venom Anaphylaxis   And wasp venom   Pneumococcal Vaccines Anaphylaxis   Mixed Vespid Venom    Wasp Venom       Medication List       Accurate as of May 06, 2019 11:59 PM. If you have any questions, ask your nurse or doctor.        STOP taking these medications   phenylephrine 1 % nasal spray Commonly known as: NEO-SYNEPHRINE Stopped by:  Charmian Muff, MD     TAKE these medications   albuterol 108 (90 Base) MCG/ACT inhaler Commonly known as: VENTOLIN HFA Inhale 2 puffs into the lungs every 6 (six) hours as needed for wheezing or shortness of breath.   aspirin 81 MG tablet Take 81 mg by mouth daily.   budesonide 32 MCG/ACT nasal spray Commonly known as: Rhinocort Allergy ONE SPRAY EACH NOSTRIL TWICE A DAY FOR NASAL CONGESTION OR DRAINAGE.   cetirizine 10 MG tablet Commonly known as: ZYRTEC Take 1 tablet (10 mg total) by mouth at bedtime.   Contrave 8-90 MG Tb12 Generic drug: Naltrexone-buPROPion HCl  ER   EPINEPHrine 0.3 mg/0.3 mL Soaj injection Commonly known as: EPI-PEN INJECT 0.3 MLS (0.3 MG TOTAL) INTO THE MUSCLE ONCE FOR 1 DOSE. USE AS DIRECTED FOR SEVERE ALLERGIC REACTION.   Larin 1.5/30 1.5-30 MG-MCG tablet Generic drug: Norethindrone Acetate-Ethinyl Estradiol   magnesium oxide-pyridoxine 362-20 MG Tabs tablet Commonly known as: BEELITH Take 1 tablet by mouth every evening.   metFORMIN 1000 MG tablet Commonly known as: GLUCOPHAGE TAKE 1 TABLET BY MOUTH TWICE DAILY   metoprolol succinate 100 MG 24 hr tablet  Commonly known as: TOPROL-XL Take 100 mg by mouth at bedtime. Take with or immediately following a meal.   montelukast 10 MG tablet Commonly known as: SINGULAIR Take 1 tablet (10 mg total) by mouth at bedtime.   omeprazole 20 MG capsule Commonly known as: PRILOSEC Take 1 capsule (20 mg total) by mouth daily. What changed:   how much to take  how to take this  when to take this Changed by:  Charmian Muff, MD   potassium citrate 10 MEQ (1080 MG) SR tablet Commonly known as: UROCIT-K Take 20 mEq by mouth every evening. Reported on 03/04/2016   Pulmicort Flexhaler 180 MCG/ACT inhaler Generic drug: budesonide INHALE TWO PUFFS BY MOUTH INTO THE LUNGS TWICE A DAY TO PREVENT COUGH OR WHEEZE. RINSE, GARGLE AND SPIT AFTER USE.   timolol 0.5 % ophthalmic solution Commonly known as: TIMOPTIC   valACYclovir 500 MG tablet Commonly known as: VALTREX       Known medication allergies: Allergies  Allergen Reactions  . Bee Venom Anaphylaxis    And wasp venom  . Pneumococcal Vaccines Anaphylaxis  . Mixed Vespid Venom   . Wasp Venom      Physical examination: Blood pressure 118/70, pulse 70, temperature 98.2 F (36.8 C), temperature source Temporal, resp. rate 16, height 5\' 5"  (1.651 m), weight 168 lb (76.2 kg), SpO2 99 %.  General: Alert, interactive, in no acute distress. HEENT: Right pupillary enlarged,TMs pearly gray, turbinates non-edematous without discharge, post-pharynx non erythematous. Neck: Supple without lymphadenopathy. Lungs: Clear to auscultation without wheezing, rhonchi or rales. {no increased work of breathing. CV: Normal S1, S2 without murmurs. Abdomen: Nondistended, nontender. Skin: Warm and dry, without lesions or rashes. Extremities:  No clubbing, cyanosis or edema. Neuro:   Grossly intact.  Diagnositics/Labs: None today  Assessment and plan:   Asthma, mod persistent - Continue Pulmicort 180- 2 puffs twice a day to prevent coughing or  wheezing.  - Montelukast 10 mg once a day for a coughing or wheezing - Ventolin 2 puffs every 4-6 hours if needed for wheezing, coughing, shortness of breath or chest tightness.  Monitor frequency of use.  Asthma control goals:   Full participation in all desired activities (may need albuterol before activity)  Albuterol use two time or less a week on average (not counting use with activity)  Cough interfering with sleep two time or less a month  Oral steroids no more than once a year  No hospitalizations  Allergic rhinitis   - Zyrtec 10 mg once a day for runny nose  - Rhinocort 1-2 spray per nostril twice a day for stuffy nose  - Montelukast as above  Venom allergy  - continue venom injections every 8 weeks  - If you have an insect sting take Benadryl 50 mg every 4 hours and if you have life-threatening symptoms inject him with EpiPen 0.3 mg  Follow-up yearly or sooner if needed  I appreciate the opportunity to take part in Rachel Hooper's  care. Please do not hesitate to contact me with questions.  Sincerely,   Prudy Feeler, MD Allergy/Immunology [facility]

## 2019-05-07 MED ORDER — PULMICORT FLEXHALER 180 MCG/ACT IN AEPB
INHALATION_SPRAY | RESPIRATORY_TRACT | 3 refills | Status: DC
Start: 2019-05-07 — End: 2020-06-28

## 2019-05-07 MED ORDER — MONTELUKAST SODIUM 10 MG PO TABS: 10.0000 mg | ORAL_TABLET | Freq: Every day | ORAL | 5 refills | Status: DC

## 2019-05-07 MED ORDER — OMEPRAZOLE 20 MG PO CPDR: 20.0000 mg | DELAYED_RELEASE_CAPSULE | Freq: Every day | ORAL | 5 refills | Status: DC

## 2019-05-07 MED ORDER — EPINEPHRINE 0.3 MG/0.3ML IJ SOAJ: INTRAMUSCULAR | 2 refills | Status: DC

## 2019-05-07 MED ORDER — BUDESONIDE 32 MCG/ACT NA SUSP: NASAL | 5 refills | Status: DC

## 2019-05-07 MED ORDER — ALBUTEROL SULFATE HFA 108 (90 BASE) MCG/ACT IN AERS: 2.0000 | INHALATION_SPRAY | Freq: Four times a day (QID) | RESPIRATORY_TRACT | 1 refills | Status: DC | PRN

## 2019-05-07 MED ORDER — CETIRIZINE HCL 10 MG PO TABS: 10.0000 mg | ORAL_TABLET | Freq: Every day | ORAL | 5 refills | Status: DC

## 2019-05-10 DIAGNOSIS — N83202 Unspecified ovarian cyst, left side: Secondary | ICD-10-CM | POA: Diagnosis not present

## 2019-05-10 DIAGNOSIS — N83201 Unspecified ovarian cyst, right side: Secondary | ICD-10-CM | POA: Diagnosis not present

## 2019-05-10 DIAGNOSIS — N809 Endometriosis, unspecified: Secondary | ICD-10-CM | POA: Diagnosis not present

## 2019-06-22 ENCOUNTER — Ambulatory Visit: Payer: Self-pay

## 2019-06-25 ENCOUNTER — Ambulatory Visit (INDEPENDENT_AMBULATORY_CARE_PROVIDER_SITE_OTHER): Payer: 59 | Admitting: *Deleted

## 2019-06-25 DIAGNOSIS — T63441D Toxic effect of venom of bees, accidental (unintentional), subsequent encounter: Secondary | ICD-10-CM | POA: Diagnosis not present

## 2019-07-05 DIAGNOSIS — H0102B Squamous blepharitis left eye, upper and lower eyelids: Secondary | ICD-10-CM | POA: Diagnosis not present

## 2019-07-05 DIAGNOSIS — H16301 Unspecified interstitial keratitis, right eye: Secondary | ICD-10-CM | POA: Diagnosis not present

## 2019-07-05 DIAGNOSIS — H0102A Squamous blepharitis right eye, upper and lower eyelids: Secondary | ICD-10-CM | POA: Diagnosis not present

## 2019-07-05 DIAGNOSIS — H04123 Dry eye syndrome of bilateral lacrimal glands: Secondary | ICD-10-CM | POA: Diagnosis not present

## 2019-07-05 DIAGNOSIS — H17821 Peripheral opacity of cornea, right eye: Secondary | ICD-10-CM | POA: Diagnosis not present

## 2019-08-09 DIAGNOSIS — H0102A Squamous blepharitis right eye, upper and lower eyelids: Secondary | ICD-10-CM | POA: Diagnosis not present

## 2019-08-09 DIAGNOSIS — H16301 Unspecified interstitial keratitis, right eye: Secondary | ICD-10-CM | POA: Diagnosis not present

## 2019-08-09 DIAGNOSIS — H0102B Squamous blepharitis left eye, upper and lower eyelids: Secondary | ICD-10-CM | POA: Diagnosis not present

## 2019-08-09 DIAGNOSIS — H04123 Dry eye syndrome of bilateral lacrimal glands: Secondary | ICD-10-CM | POA: Diagnosis not present

## 2019-08-09 DIAGNOSIS — H17821 Peripheral opacity of cornea, right eye: Secondary | ICD-10-CM | POA: Diagnosis not present

## 2019-08-16 DIAGNOSIS — R82992 Hyperoxaluria: Secondary | ICD-10-CM | POA: Diagnosis not present

## 2019-08-16 DIAGNOSIS — N200 Calculus of kidney: Secondary | ICD-10-CM | POA: Diagnosis not present

## 2019-08-16 DIAGNOSIS — N3010 Interstitial cystitis (chronic) without hematuria: Secondary | ICD-10-CM | POA: Diagnosis not present

## 2019-08-16 MED FILL — HYDROCODON-APAP 5-325: 5-325 | 5 days supply | Qty: 20 | Fill #0

## 2019-08-16 MED FILL — POTASSIUM CITRATE ER 10 MEQ: 10 MEQ | 90 days supply | Qty: 180 | Fill #0

## 2019-08-24 ENCOUNTER — Other Ambulatory Visit: Payer: Self-pay

## 2019-08-24 ENCOUNTER — Ambulatory Visit (INDEPENDENT_AMBULATORY_CARE_PROVIDER_SITE_OTHER): Payer: 59

## 2019-08-24 DIAGNOSIS — T63441D Toxic effect of venom of bees, accidental (unintentional), subsequent encounter: Secondary | ICD-10-CM

## 2019-08-27 MED FILL — POTASSIUM CITRATE ER 10 MEQ: 10 MEQ | 90 days supply | Qty: 180 | Fill #0

## 2019-08-27 MED FILL — HYDROCODON-APAP 5-325: 5-325 | 5 days supply | Qty: 20 | Fill #0

## 2019-08-27 MED FILL — BEELITH TABLET: 362-20 | 200 days supply | Qty: 200 | Fill #0

## 2019-09-06 MED FILL — TIMOLOL 0.5% EYE DROPS: 0.5 | 30 days supply | Qty: 5 | Fill #0

## 2019-09-14 DIAGNOSIS — H16301 Unspecified interstitial keratitis, right eye: Secondary | ICD-10-CM | POA: Diagnosis not present

## 2019-09-14 DIAGNOSIS — H17821 Peripheral opacity of cornea, right eye: Secondary | ICD-10-CM | POA: Diagnosis not present

## 2019-09-14 DIAGNOSIS — H40051 Ocular hypertension, right eye: Secondary | ICD-10-CM | POA: Diagnosis not present

## 2019-09-17 DIAGNOSIS — H16301 Unspecified interstitial keratitis, right eye: Secondary | ICD-10-CM | POA: Diagnosis not present

## 2019-09-17 DIAGNOSIS — H17821 Peripheral opacity of cornea, right eye: Secondary | ICD-10-CM | POA: Diagnosis not present

## 2019-09-17 DIAGNOSIS — H40051 Ocular hypertension, right eye: Secondary | ICD-10-CM | POA: Diagnosis not present

## 2019-09-20 DIAGNOSIS — K219 Gastro-esophageal reflux disease without esophagitis: Secondary | ICD-10-CM | POA: Diagnosis not present

## 2019-09-20 DIAGNOSIS — I10 Essential (primary) hypertension: Secondary | ICD-10-CM | POA: Diagnosis not present

## 2019-09-20 DIAGNOSIS — E7800 Pure hypercholesterolemia, unspecified: Secondary | ICD-10-CM | POA: Diagnosis not present

## 2019-09-20 DIAGNOSIS — Z0000 Encounter for general adult medical examination without abnormal findings: Secondary | ICD-10-CM | POA: Diagnosis not present

## 2019-09-20 DIAGNOSIS — F419 Anxiety disorder, unspecified: Secondary | ICD-10-CM | POA: Diagnosis not present

## 2019-10-04 DIAGNOSIS — H04123 Dry eye syndrome of bilateral lacrimal glands: Secondary | ICD-10-CM | POA: Diagnosis not present

## 2019-10-04 DIAGNOSIS — H0102B Squamous blepharitis left eye, upper and lower eyelids: Secondary | ICD-10-CM | POA: Diagnosis not present

## 2019-10-04 DIAGNOSIS — H0102A Squamous blepharitis right eye, upper and lower eyelids: Secondary | ICD-10-CM | POA: Diagnosis not present

## 2019-10-04 DIAGNOSIS — H17821 Peripheral opacity of cornea, right eye: Secondary | ICD-10-CM | POA: Diagnosis not present

## 2019-10-04 DIAGNOSIS — H40051 Ocular hypertension, right eye: Secondary | ICD-10-CM | POA: Diagnosis not present

## 2019-10-04 DIAGNOSIS — H16301 Unspecified interstitial keratitis, right eye: Secondary | ICD-10-CM | POA: Diagnosis not present

## 2019-10-18 MED FILL — BRIMONIDINE 0.2% EYE DROP: 0.2 | 75 days supply | Qty: 10 | Fill #0

## 2019-10-19 ENCOUNTER — Ambulatory Visit (INDEPENDENT_AMBULATORY_CARE_PROVIDER_SITE_OTHER): Payer: 59

## 2019-10-19 DIAGNOSIS — T63441D Toxic effect of venom of bees, accidental (unintentional), subsequent encounter: Secondary | ICD-10-CM

## 2019-10-20 DIAGNOSIS — H0102B Squamous blepharitis left eye, upper and lower eyelids: Secondary | ICD-10-CM | POA: Diagnosis not present

## 2019-10-20 DIAGNOSIS — H04123 Dry eye syndrome of bilateral lacrimal glands: Secondary | ICD-10-CM | POA: Diagnosis not present

## 2019-10-20 DIAGNOSIS — H0102A Squamous blepharitis right eye, upper and lower eyelids: Secondary | ICD-10-CM | POA: Diagnosis not present

## 2019-10-20 DIAGNOSIS — H40051 Ocular hypertension, right eye: Secondary | ICD-10-CM | POA: Diagnosis not present

## 2019-10-20 DIAGNOSIS — H17821 Peripheral opacity of cornea, right eye: Secondary | ICD-10-CM | POA: Diagnosis not present

## 2019-10-20 DIAGNOSIS — H16301 Unspecified interstitial keratitis, right eye: Secondary | ICD-10-CM | POA: Diagnosis not present

## 2019-10-20 MED FILL — valACYclovir HCL 1 GM TABS: 1 | 30 days supply | Qty: 60 | Fill #0

## 2019-10-21 DIAGNOSIS — H17821 Peripheral opacity of cornea, right eye: Secondary | ICD-10-CM | POA: Diagnosis not present

## 2019-10-21 DIAGNOSIS — H40051 Ocular hypertension, right eye: Secondary | ICD-10-CM | POA: Diagnosis not present

## 2019-10-21 DIAGNOSIS — H16301 Unspecified interstitial keratitis, right eye: Secondary | ICD-10-CM | POA: Diagnosis not present

## 2019-10-21 MED FILL — PREDNISOLONE AC 1% EYE DROP: 1 | 38 days supply | Qty: 10 | Fill #0

## 2019-10-21 MED FILL — acetaZOLAMIDE 250 MG TABS: 250 | 30 days supply | Qty: 90 | Fill #0

## 2019-10-22 DIAGNOSIS — H04123 Dry eye syndrome of bilateral lacrimal glands: Secondary | ICD-10-CM | POA: Diagnosis not present

## 2019-10-22 DIAGNOSIS — H16301 Unspecified interstitial keratitis, right eye: Secondary | ICD-10-CM | POA: Diagnosis not present

## 2019-10-22 DIAGNOSIS — H0102A Squamous blepharitis right eye, upper and lower eyelids: Secondary | ICD-10-CM | POA: Diagnosis not present

## 2019-10-22 DIAGNOSIS — H0102B Squamous blepharitis left eye, upper and lower eyelids: Secondary | ICD-10-CM | POA: Diagnosis not present

## 2019-10-22 DIAGNOSIS — H17821 Peripheral opacity of cornea, right eye: Secondary | ICD-10-CM | POA: Diagnosis not present

## 2019-10-22 DIAGNOSIS — H40051 Ocular hypertension, right eye: Secondary | ICD-10-CM | POA: Diagnosis not present

## 2019-10-25 DIAGNOSIS — H17821 Peripheral opacity of cornea, right eye: Secondary | ICD-10-CM | POA: Diagnosis not present

## 2019-10-25 DIAGNOSIS — H40051 Ocular hypertension, right eye: Secondary | ICD-10-CM | POA: Diagnosis not present

## 2019-10-25 DIAGNOSIS — H0102B Squamous blepharitis left eye, upper and lower eyelids: Secondary | ICD-10-CM | POA: Diagnosis not present

## 2019-10-25 DIAGNOSIS — H16301 Unspecified interstitial keratitis, right eye: Secondary | ICD-10-CM | POA: Diagnosis not present

## 2019-10-25 DIAGNOSIS — H0102A Squamous blepharitis right eye, upper and lower eyelids: Secondary | ICD-10-CM | POA: Diagnosis not present

## 2019-10-25 MED FILL — DUREZOL 0.05% EYE DROPS: 0.05 | 37 days supply | Qty: 5 | Fill #0

## 2019-10-27 DIAGNOSIS — H17821 Peripheral opacity of cornea, right eye: Secondary | ICD-10-CM | POA: Diagnosis not present

## 2019-10-27 DIAGNOSIS — H16301 Unspecified interstitial keratitis, right eye: Secondary | ICD-10-CM | POA: Diagnosis not present

## 2019-10-27 DIAGNOSIS — H40041 Steroid responder, right eye: Secondary | ICD-10-CM | POA: Diagnosis not present

## 2019-10-27 DIAGNOSIS — H0102A Squamous blepharitis right eye, upper and lower eyelids: Secondary | ICD-10-CM | POA: Diagnosis not present

## 2019-10-27 DIAGNOSIS — H0102B Squamous blepharitis left eye, upper and lower eyelids: Secondary | ICD-10-CM | POA: Diagnosis not present

## 2019-10-27 DIAGNOSIS — H40051 Ocular hypertension, right eye: Secondary | ICD-10-CM | POA: Diagnosis not present

## 2019-10-27 DIAGNOSIS — H04123 Dry eye syndrome of bilateral lacrimal glands: Secondary | ICD-10-CM | POA: Diagnosis not present

## 2019-10-28 DIAGNOSIS — H40041 Steroid responder, right eye: Secondary | ICD-10-CM | POA: Diagnosis not present

## 2019-10-28 MED FILL — ACETAZOLAMIDE ER 500 MG CAP: 500 | 30 days supply | Qty: 60 | Fill #0

## 2019-10-29 DIAGNOSIS — Z79899 Other long term (current) drug therapy: Secondary | ICD-10-CM | POA: Diagnosis not present

## 2019-10-29 DIAGNOSIS — H4061X0 Glaucoma secondary to drugs, right eye, stage unspecified: Secondary | ICD-10-CM | POA: Diagnosis not present

## 2019-10-29 DIAGNOSIS — T380X5A Adverse effect of glucocorticoids and synthetic analogues, initial encounter: Secondary | ICD-10-CM | POA: Diagnosis not present

## 2019-11-01 DIAGNOSIS — H16301 Unspecified interstitial keratitis, right eye: Secondary | ICD-10-CM | POA: Diagnosis not present

## 2019-11-01 DIAGNOSIS — H0102A Squamous blepharitis right eye, upper and lower eyelids: Secondary | ICD-10-CM | POA: Diagnosis not present

## 2019-11-01 DIAGNOSIS — H40051 Ocular hypertension, right eye: Secondary | ICD-10-CM | POA: Diagnosis not present

## 2019-11-01 DIAGNOSIS — H17821 Peripheral opacity of cornea, right eye: Secondary | ICD-10-CM | POA: Diagnosis not present

## 2019-11-01 DIAGNOSIS — H04123 Dry eye syndrome of bilateral lacrimal glands: Secondary | ICD-10-CM | POA: Diagnosis not present

## 2019-11-01 DIAGNOSIS — H0102B Squamous blepharitis left eye, upper and lower eyelids: Secondary | ICD-10-CM | POA: Diagnosis not present

## 2019-11-03 DIAGNOSIS — H4061X0 Glaucoma secondary to drugs, right eye, stage unspecified: Secondary | ICD-10-CM | POA: Diagnosis not present

## 2019-11-03 DIAGNOSIS — H209 Unspecified iridocyclitis: Secondary | ICD-10-CM | POA: Diagnosis not present

## 2019-11-03 DIAGNOSIS — Z01812 Encounter for preprocedural laboratory examination: Secondary | ICD-10-CM | POA: Diagnosis not present

## 2019-11-03 DIAGNOSIS — B0052 Herpesviral keratitis: Secondary | ICD-10-CM | POA: Diagnosis not present

## 2019-11-03 DIAGNOSIS — Z20822 Contact with and (suspected) exposure to covid-19: Secondary | ICD-10-CM | POA: Diagnosis not present

## 2019-11-03 DIAGNOSIS — H2011 Chronic iridocyclitis, right eye: Secondary | ICD-10-CM | POA: Diagnosis not present

## 2019-11-03 DIAGNOSIS — T380X5A Adverse effect of glucocorticoids and synthetic analogues, initial encounter: Secondary | ICD-10-CM | POA: Diagnosis not present

## 2019-11-05 MED FILL — valACYclovir HCL 1 GM TABS: 1 | 30 days supply | Qty: 90 | Fill #0

## 2019-11-09 DIAGNOSIS — T380X5A Adverse effect of glucocorticoids and synthetic analogues, initial encounter: Secondary | ICD-10-CM | POA: Diagnosis not present

## 2019-11-09 DIAGNOSIS — N3010 Interstitial cystitis (chronic) without hematuria: Secondary | ICD-10-CM | POA: Diagnosis not present

## 2019-11-09 DIAGNOSIS — H4061X0 Glaucoma secondary to drugs, right eye, stage unspecified: Secondary | ICD-10-CM | POA: Diagnosis not present

## 2019-11-09 DIAGNOSIS — Z79899 Other long term (current) drug therapy: Secondary | ICD-10-CM | POA: Diagnosis not present

## 2019-11-09 DIAGNOSIS — K219 Gastro-esophageal reflux disease without esophagitis: Secondary | ICD-10-CM | POA: Diagnosis not present

## 2019-11-09 DIAGNOSIS — I10 Essential (primary) hypertension: Secondary | ICD-10-CM | POA: Diagnosis not present

## 2019-11-09 DIAGNOSIS — J45909 Unspecified asthma, uncomplicated: Secondary | ICD-10-CM | POA: Diagnosis not present

## 2019-11-09 DIAGNOSIS — E785 Hyperlipidemia, unspecified: Secondary | ICD-10-CM | POA: Diagnosis not present

## 2019-11-10 DIAGNOSIS — H4061X0 Glaucoma secondary to drugs, right eye, stage unspecified: Secondary | ICD-10-CM | POA: Diagnosis not present

## 2019-11-10 DIAGNOSIS — Z7952 Long term (current) use of systemic steroids: Secondary | ICD-10-CM | POA: Diagnosis not present

## 2019-11-10 DIAGNOSIS — Z79899 Other long term (current) drug therapy: Secondary | ICD-10-CM | POA: Diagnosis not present

## 2019-11-10 DIAGNOSIS — T380X5A Adverse effect of glucocorticoids and synthetic analogues, initial encounter: Secondary | ICD-10-CM | POA: Diagnosis not present

## 2019-11-15 MED FILL — MONTELUKAST SOD 10 MG TAB: 10 | 90 days supply | Qty: 90 | Fill #0

## 2019-11-15 MED FILL — POTASSIUM CITRATE ER 10 MEQ: 10 MEQ | 90 days supply | Qty: 180 | Fill #1

## 2019-11-15 MED FILL — predniSONE 10 MG TABS: 10 | 30 days supply | Qty: 30 | Fill #0

## 2019-11-16 MED FILL — OMEPRAZOLE DR 20 MG CAPSULE: 20 | 90 days supply | Qty: 90 | Fill #0

## 2019-11-16 MED FILL — METOPROLOL SUCCINATE ER 100: 100 | 90 days supply | Qty: 90 | Fill #0

## 2019-11-22 DIAGNOSIS — H0102B Squamous blepharitis left eye, upper and lower eyelids: Secondary | ICD-10-CM | POA: Diagnosis not present

## 2019-11-22 DIAGNOSIS — H17821 Peripheral opacity of cornea, right eye: Secondary | ICD-10-CM | POA: Diagnosis not present

## 2019-11-22 DIAGNOSIS — H04123 Dry eye syndrome of bilateral lacrimal glands: Secondary | ICD-10-CM | POA: Diagnosis not present

## 2019-11-22 DIAGNOSIS — H16301 Unspecified interstitial keratitis, right eye: Secondary | ICD-10-CM | POA: Diagnosis not present

## 2019-11-22 DIAGNOSIS — H0102A Squamous blepharitis right eye, upper and lower eyelids: Secondary | ICD-10-CM | POA: Diagnosis not present

## 2019-11-22 DIAGNOSIS — Z9889 Other specified postprocedural states: Secondary | ICD-10-CM | POA: Diagnosis not present

## 2019-11-29 DIAGNOSIS — H0102A Squamous blepharitis right eye, upper and lower eyelids: Secondary | ICD-10-CM | POA: Diagnosis not present

## 2019-11-29 DIAGNOSIS — Z9889 Other specified postprocedural states: Secondary | ICD-10-CM | POA: Diagnosis not present

## 2019-11-29 DIAGNOSIS — H16301 Unspecified interstitial keratitis, right eye: Secondary | ICD-10-CM | POA: Diagnosis not present

## 2019-11-29 DIAGNOSIS — H17821 Peripheral opacity of cornea, right eye: Secondary | ICD-10-CM | POA: Diagnosis not present

## 2019-11-29 DIAGNOSIS — H04123 Dry eye syndrome of bilateral lacrimal glands: Secondary | ICD-10-CM | POA: Diagnosis not present

## 2019-11-29 DIAGNOSIS — H0102B Squamous blepharitis left eye, upper and lower eyelids: Secondary | ICD-10-CM | POA: Diagnosis not present

## 2019-11-29 MED FILL — TIMOLOL 0.5% EYE DROPS: 0.5 | 75 days supply | Qty: 5 | Fill #0

## 2019-11-30 DIAGNOSIS — B0052 Herpesviral keratitis: Secondary | ICD-10-CM | POA: Diagnosis not present

## 2019-11-30 DIAGNOSIS — T380X5A Adverse effect of glucocorticoids and synthetic analogues, initial encounter: Secondary | ICD-10-CM | POA: Diagnosis not present

## 2019-11-30 DIAGNOSIS — H4061X0 Glaucoma secondary to drugs, right eye, stage unspecified: Secondary | ICD-10-CM | POA: Diagnosis not present

## 2019-11-30 DIAGNOSIS — H209 Unspecified iridocyclitis: Secondary | ICD-10-CM | POA: Diagnosis not present

## 2019-11-30 MED FILL — azaTHIOprine 50 MG TABS: 50 | 30 days supply | Qty: 60 | Fill #0

## 2019-11-30 MED FILL — valACYclovir HCL 1 GM TABS: 1 | 30 days supply | Qty: 90 | Fill #0

## 2019-12-03 MED FILL — CONTRAVE ER 8-90 MG TABLET: 8-90 | 90 days supply | Qty: 360 | Fill #0

## 2019-12-13 DIAGNOSIS — H0102A Squamous blepharitis right eye, upper and lower eyelids: Secondary | ICD-10-CM | POA: Diagnosis not present

## 2019-12-13 DIAGNOSIS — H17821 Peripheral opacity of cornea, right eye: Secondary | ICD-10-CM | POA: Diagnosis not present

## 2019-12-13 DIAGNOSIS — H0102B Squamous blepharitis left eye, upper and lower eyelids: Secondary | ICD-10-CM | POA: Diagnosis not present

## 2019-12-13 DIAGNOSIS — Z9889 Other specified postprocedural states: Secondary | ICD-10-CM | POA: Diagnosis not present

## 2019-12-13 DIAGNOSIS — H16301 Unspecified interstitial keratitis, right eye: Secondary | ICD-10-CM | POA: Diagnosis not present

## 2019-12-13 DIAGNOSIS — H04123 Dry eye syndrome of bilateral lacrimal glands: Secondary | ICD-10-CM | POA: Diagnosis not present

## 2019-12-14 ENCOUNTER — Ambulatory Visit: Payer: Self-pay

## 2019-12-15 ENCOUNTER — Ambulatory Visit (INDEPENDENT_AMBULATORY_CARE_PROVIDER_SITE_OTHER): Payer: 59 | Admitting: *Deleted

## 2019-12-15 DIAGNOSIS — T63441D Toxic effect of venom of bees, accidental (unintentional), subsequent encounter: Secondary | ICD-10-CM | POA: Diagnosis not present

## 2019-12-15 MED FILL — DUREZOL 0.05% EYE DROPS: 0.05 | 37 days supply | Qty: 5 | Fill #1

## 2019-12-29 DIAGNOSIS — B0052 Herpesviral keratitis: Secondary | ICD-10-CM | POA: Diagnosis not present

## 2019-12-29 DIAGNOSIS — T380X5A Adverse effect of glucocorticoids and synthetic analogues, initial encounter: Secondary | ICD-10-CM | POA: Diagnosis not present

## 2019-12-29 DIAGNOSIS — H4061X0 Glaucoma secondary to drugs, right eye, stage unspecified: Secondary | ICD-10-CM | POA: Diagnosis not present

## 2019-12-29 DIAGNOSIS — H209 Unspecified iridocyclitis: Secondary | ICD-10-CM | POA: Diagnosis not present

## 2019-12-29 MED FILL — azaTHIOprine 50 MG TABS: 50 | 90 days supply | Qty: 180 | Fill #0

## 2019-12-29 MED FILL — LARIN 1.5 MG-30 MCG TABLET: 1.5-30 | 84 days supply | Qty: 84 | Fill #0

## 2020-01-18 DIAGNOSIS — E7800 Pure hypercholesterolemia, unspecified: Secondary | ICD-10-CM | POA: Diagnosis not present

## 2020-02-01 ENCOUNTER — Other Ambulatory Visit: Payer: Self-pay | Admitting: Ophthalmology

## 2020-02-01 DIAGNOSIS — H209 Unspecified iridocyclitis: Secondary | ICD-10-CM | POA: Diagnosis not present

## 2020-02-01 DIAGNOSIS — T380X5A Adverse effect of glucocorticoids and synthetic analogues, initial encounter: Secondary | ICD-10-CM | POA: Diagnosis not present

## 2020-02-01 DIAGNOSIS — B0052 Herpesviral keratitis: Secondary | ICD-10-CM | POA: Diagnosis not present

## 2020-02-01 DIAGNOSIS — H4061X0 Glaucoma secondary to drugs, right eye, stage unspecified: Secondary | ICD-10-CM | POA: Diagnosis not present

## 2020-02-01 MED FILL — valACYclovir HCL 1 GM TABS: 1 | 45 days supply | Qty: 90 | Fill #0

## 2020-02-03 DIAGNOSIS — Z9889 Other specified postprocedural states: Secondary | ICD-10-CM | POA: Diagnosis not present

## 2020-02-03 DIAGNOSIS — H0102A Squamous blepharitis right eye, upper and lower eyelids: Secondary | ICD-10-CM | POA: Diagnosis not present

## 2020-02-03 DIAGNOSIS — H17821 Peripheral opacity of cornea, right eye: Secondary | ICD-10-CM | POA: Diagnosis not present

## 2020-02-03 DIAGNOSIS — H16301 Unspecified interstitial keratitis, right eye: Secondary | ICD-10-CM | POA: Diagnosis not present

## 2020-02-03 DIAGNOSIS — H0102B Squamous blepharitis left eye, upper and lower eyelids: Secondary | ICD-10-CM | POA: Diagnosis not present

## 2020-02-03 DIAGNOSIS — H04123 Dry eye syndrome of bilateral lacrimal glands: Secondary | ICD-10-CM | POA: Diagnosis not present

## 2020-02-10 ENCOUNTER — Other Ambulatory Visit: Payer: Self-pay

## 2020-02-10 ENCOUNTER — Ambulatory Visit (INDEPENDENT_AMBULATORY_CARE_PROVIDER_SITE_OTHER): Payer: 59

## 2020-02-10 DIAGNOSIS — T63441D Toxic effect of venom of bees, accidental (unintentional), subsequent encounter: Secondary | ICD-10-CM

## 2020-03-09 DIAGNOSIS — H0102A Squamous blepharitis right eye, upper and lower eyelids: Secondary | ICD-10-CM | POA: Diagnosis not present

## 2020-03-09 DIAGNOSIS — H04123 Dry eye syndrome of bilateral lacrimal glands: Secondary | ICD-10-CM | POA: Diagnosis not present

## 2020-03-09 DIAGNOSIS — Z9889 Other specified postprocedural states: Secondary | ICD-10-CM | POA: Diagnosis not present

## 2020-03-09 DIAGNOSIS — H0102B Squamous blepharitis left eye, upper and lower eyelids: Secondary | ICD-10-CM | POA: Diagnosis not present

## 2020-03-09 DIAGNOSIS — H16301 Unspecified interstitial keratitis, right eye: Secondary | ICD-10-CM | POA: Diagnosis not present

## 2020-03-09 DIAGNOSIS — H17821 Peripheral opacity of cornea, right eye: Secondary | ICD-10-CM | POA: Diagnosis not present

## 2020-03-16 DIAGNOSIS — Z9889 Other specified postprocedural states: Secondary | ICD-10-CM | POA: Diagnosis not present

## 2020-03-16 DIAGNOSIS — H04123 Dry eye syndrome of bilateral lacrimal glands: Secondary | ICD-10-CM | POA: Diagnosis not present

## 2020-03-16 DIAGNOSIS — H16301 Unspecified interstitial keratitis, right eye: Secondary | ICD-10-CM | POA: Diagnosis not present

## 2020-03-16 DIAGNOSIS — H17821 Peripheral opacity of cornea, right eye: Secondary | ICD-10-CM | POA: Diagnosis not present

## 2020-03-16 DIAGNOSIS — H0102A Squamous blepharitis right eye, upper and lower eyelids: Secondary | ICD-10-CM | POA: Diagnosis not present

## 2020-03-16 DIAGNOSIS — H0102B Squamous blepharitis left eye, upper and lower eyelids: Secondary | ICD-10-CM | POA: Diagnosis not present

## 2020-03-16 DIAGNOSIS — H401112 Primary open-angle glaucoma, right eye, moderate stage: Secondary | ICD-10-CM | POA: Diagnosis not present

## 2020-03-17 DIAGNOSIS — N200 Calculus of kidney: Secondary | ICD-10-CM | POA: Diagnosis not present

## 2020-03-23 DIAGNOSIS — H0102A Squamous blepharitis right eye, upper and lower eyelids: Secondary | ICD-10-CM | POA: Diagnosis not present

## 2020-03-23 DIAGNOSIS — H17821 Peripheral opacity of cornea, right eye: Secondary | ICD-10-CM | POA: Diagnosis not present

## 2020-03-23 DIAGNOSIS — H0102B Squamous blepharitis left eye, upper and lower eyelids: Secondary | ICD-10-CM | POA: Diagnosis not present

## 2020-03-23 DIAGNOSIS — H401112 Primary open-angle glaucoma, right eye, moderate stage: Secondary | ICD-10-CM | POA: Diagnosis not present

## 2020-03-23 DIAGNOSIS — H04123 Dry eye syndrome of bilateral lacrimal glands: Secondary | ICD-10-CM | POA: Diagnosis not present

## 2020-03-23 DIAGNOSIS — Z9889 Other specified postprocedural states: Secondary | ICD-10-CM | POA: Diagnosis not present

## 2020-03-23 DIAGNOSIS — H16301 Unspecified interstitial keratitis, right eye: Secondary | ICD-10-CM | POA: Diagnosis not present

## 2020-03-27 DIAGNOSIS — H4061X0 Glaucoma secondary to drugs, right eye, stage unspecified: Secondary | ICD-10-CM | POA: Diagnosis not present

## 2020-03-27 DIAGNOSIS — N3010 Interstitial cystitis (chronic) without hematuria: Secondary | ICD-10-CM | POA: Diagnosis not present

## 2020-03-27 DIAGNOSIS — N200 Calculus of kidney: Secondary | ICD-10-CM | POA: Diagnosis not present

## 2020-03-27 DIAGNOSIS — T380X5A Adverse effect of glucocorticoids and synthetic analogues, initial encounter: Secondary | ICD-10-CM | POA: Diagnosis not present

## 2020-03-27 DIAGNOSIS — R82992 Hyperoxaluria: Secondary | ICD-10-CM | POA: Diagnosis not present

## 2020-03-28 DIAGNOSIS — B0052 Herpesviral keratitis: Secondary | ICD-10-CM | POA: Diagnosis not present

## 2020-03-28 DIAGNOSIS — H209 Unspecified iridocyclitis: Secondary | ICD-10-CM | POA: Diagnosis not present

## 2020-04-06 ENCOUNTER — Ambulatory Visit (INDEPENDENT_AMBULATORY_CARE_PROVIDER_SITE_OTHER): Payer: 59

## 2020-04-06 ENCOUNTER — Other Ambulatory Visit: Payer: Self-pay

## 2020-04-06 DIAGNOSIS — T63441D Toxic effect of venom of bees, accidental (unintentional), subsequent encounter: Secondary | ICD-10-CM | POA: Diagnosis not present

## 2020-04-13 MED FILL — LARIN 1.5 MG-30 MCG TABLET: 1.5-30 | 84 days supply | Qty: 84 | Fill #1

## 2020-04-14 DIAGNOSIS — J45909 Unspecified asthma, uncomplicated: Secondary | ICD-10-CM | POA: Diagnosis not present

## 2020-04-14 DIAGNOSIS — E282 Polycystic ovarian syndrome: Secondary | ICD-10-CM | POA: Diagnosis not present

## 2020-04-14 DIAGNOSIS — E7800 Pure hypercholesterolemia, unspecified: Secondary | ICD-10-CM | POA: Diagnosis not present

## 2020-04-14 DIAGNOSIS — H269 Unspecified cataract: Secondary | ICD-10-CM | POA: Diagnosis not present

## 2020-04-14 DIAGNOSIS — H409 Unspecified glaucoma: Secondary | ICD-10-CM | POA: Diagnosis not present

## 2020-04-14 DIAGNOSIS — K219 Gastro-esophageal reflux disease without esophagitis: Secondary | ICD-10-CM | POA: Diagnosis not present

## 2020-04-14 DIAGNOSIS — I10 Essential (primary) hypertension: Secondary | ICD-10-CM | POA: Diagnosis not present

## 2020-04-14 DIAGNOSIS — J309 Allergic rhinitis, unspecified: Secondary | ICD-10-CM | POA: Diagnosis not present

## 2020-04-20 DIAGNOSIS — H16301 Unspecified interstitial keratitis, right eye: Secondary | ICD-10-CM | POA: Diagnosis not present

## 2020-04-20 DIAGNOSIS — H17821 Peripheral opacity of cornea, right eye: Secondary | ICD-10-CM | POA: Diagnosis not present

## 2020-04-20 DIAGNOSIS — H0102B Squamous blepharitis left eye, upper and lower eyelids: Secondary | ICD-10-CM | POA: Diagnosis not present

## 2020-04-20 DIAGNOSIS — Z9889 Other specified postprocedural states: Secondary | ICD-10-CM | POA: Diagnosis not present

## 2020-04-20 DIAGNOSIS — H0102A Squamous blepharitis right eye, upper and lower eyelids: Secondary | ICD-10-CM | POA: Diagnosis not present

## 2020-04-20 DIAGNOSIS — H04123 Dry eye syndrome of bilateral lacrimal glands: Secondary | ICD-10-CM | POA: Diagnosis not present

## 2020-04-20 DIAGNOSIS — H401112 Primary open-angle glaucoma, right eye, moderate stage: Secondary | ICD-10-CM | POA: Diagnosis not present

## 2020-04-28 ENCOUNTER — Other Ambulatory Visit: Payer: Self-pay | Admitting: Physician Assistant

## 2020-05-04 ENCOUNTER — Other Ambulatory Visit: Payer: Self-pay | Admitting: Physician Assistant

## 2020-05-09 ENCOUNTER — Other Ambulatory Visit: Payer: Self-pay | Admitting: Ophthalmology

## 2020-05-09 DIAGNOSIS — H209 Unspecified iridocyclitis: Secondary | ICD-10-CM | POA: Diagnosis not present

## 2020-05-09 DIAGNOSIS — H4061X0 Glaucoma secondary to drugs, right eye, stage unspecified: Secondary | ICD-10-CM | POA: Diagnosis not present

## 2020-05-09 DIAGNOSIS — B0052 Herpesviral keratitis: Secondary | ICD-10-CM | POA: Diagnosis not present

## 2020-05-09 DIAGNOSIS — T380X5A Adverse effect of glucocorticoids and synthetic analogues, initial encounter: Secondary | ICD-10-CM | POA: Diagnosis not present

## 2020-05-10 DIAGNOSIS — H4061X0 Glaucoma secondary to drugs, right eye, stage unspecified: Secondary | ICD-10-CM | POA: Diagnosis not present

## 2020-05-10 DIAGNOSIS — B0052 Herpesviral keratitis: Secondary | ICD-10-CM | POA: Diagnosis not present

## 2020-05-10 DIAGNOSIS — T380X5A Adverse effect of glucocorticoids and synthetic analogues, initial encounter: Secondary | ICD-10-CM | POA: Diagnosis not present

## 2020-05-26 ENCOUNTER — Telehealth: Payer: Self-pay | Admitting: Allergy

## 2020-05-26 NOTE — Telephone Encounter (Addendum)
Patient is needing a workplace accommodation form filled out for work, because she is working less than 6 feet from other people. This fill would allow her to possibly get her own office. Patient works at [employer] and will fax over the form filled out and a blank form in case it needs to be rewritten. Patient requests if we have any questions or concerns please give her a call before denying.  Please advise.

## 2020-05-26 NOTE — Telephone Encounter (Signed)
Forms have been placed in nurses station in file.

## 2020-05-29 NOTE — Telephone Encounter (Signed)
Forms have been placed in Dr. Jeralyn Ruths mailbox for her to review and see if she would like to complete.

## 2020-05-31 NOTE — Telephone Encounter (Signed)
Patient is coming in to get venom injections and would like to collect her forms in the morning to submitted to [facility] [employer] can make a copy and send to [facility] [employer]. Concerning co-worker who is not vaccinated with Covid vaccine. Wanted to switch office and she is immunocompromise person.

## 2020-05-31 NOTE — Telephone Encounter (Signed)
I have her form ready.  Please let her know I can only address issues that I am managing -- namely her asthma.   I see she is on [employer] however if this also a concern as to need for different office space due close proximity to others then she will need to have the prescriber of that attest to that concern.

## 2020-06-01 ENCOUNTER — Ambulatory Visit (INDEPENDENT_AMBULATORY_CARE_PROVIDER_SITE_OTHER): Payer: 59

## 2020-06-01 ENCOUNTER — Other Ambulatory Visit: Payer: Self-pay

## 2020-06-01 DIAGNOSIS — T63441D Toxic effect of venom of bees, accidental (unintentional), subsequent encounter: Secondary | ICD-10-CM | POA: Diagnosis not present

## 2020-06-01 NOTE — Telephone Encounter (Signed)
Please let her know that using inhaled corticosteroids would not make one immunosuppressed.  What would make one immunosuppressed with asthma is if they were not well controlled and needing SYSTEMIC steroids like prednisone or steroid injections frequently and/or chronic use.

## 2020-06-01 NOTE — Telephone Encounter (Signed)
Advise patient on forms being managed by Dr. Nelva Bush and patient wanted to know if Pulmicort or budesonide is not considered Immuno suppression due to being a corticoid steroid?

## 2020-06-01 NOTE — Telephone Encounter (Signed)
Per DPR I called and left a detailed voicemail per Dr. Jeralyn Ruths instructions. Asked for the patient to return call with any questions.

## 2020-06-01 NOTE — Telephone Encounter (Signed)
Patient is coming in today for her venom injection. I have made copies of completed forms and placed in bulk scanning to be sent to scan center.

## 2020-06-20 ENCOUNTER — Ambulatory Visit: Payer: Self-pay

## 2020-06-23 ENCOUNTER — Ambulatory Visit: Payer: 59 | Attending: Internal Medicine

## 2020-06-23 DIAGNOSIS — Z23 Encounter for immunization: Secondary | ICD-10-CM

## 2020-06-23 NOTE — Progress Notes (Signed)
   Covid-19 Vaccination Clinic  Name:  Rachel Hooper    MRN: [id number] DOB: 07/30/1981  06/23/2020  Ms. Rachel Hooper was observed post Covid-19 immunization for 30 minutes based on pre-vaccination screening without incident. She was provided with Vaccine Information Sheet and instruction to access the V-Safe system.   [name] was instructed to call 911 with any severe reactions post vaccine: Marland Kitchen Difficulty breathing  . Swelling of face and throat  . A fast heartbeat  . A bad rash all over body  . Dizziness and weakness

## 2020-06-28 ENCOUNTER — Ambulatory Visit (INDEPENDENT_AMBULATORY_CARE_PROVIDER_SITE_OTHER): Payer: 59 | Admitting: Allergy

## 2020-06-28 ENCOUNTER — Other Ambulatory Visit: Payer: Self-pay

## 2020-06-28 ENCOUNTER — Other Ambulatory Visit: Payer: Self-pay | Admitting: Allergy

## 2020-06-28 ENCOUNTER — Encounter: Payer: Self-pay | Admitting: Allergy

## 2020-06-28 VITALS — BP 112/78 | HR 71 | Temp 98.5°F | Resp 16 | Ht 65.0 in | Wt 156.0 lb

## 2020-06-28 DIAGNOSIS — T63441D Toxic effect of venom of bees, accidental (unintentional), subsequent encounter: Secondary | ICD-10-CM | POA: Diagnosis not present

## 2020-06-28 DIAGNOSIS — J4540 Moderate persistent asthma, uncomplicated: Secondary | ICD-10-CM

## 2020-06-28 DIAGNOSIS — J301 Allergic rhinitis due to pollen: Secondary | ICD-10-CM

## 2020-06-28 MED ORDER — OMEPRAZOLE 20 MG PO CPDR: 20.0000 mg | DELAYED_RELEASE_CAPSULE | Freq: Every day | ORAL | 5 refills | Status: DC

## 2020-06-28 MED ORDER — PULMICORT FLEXHALER 180 MCG/ACT IN AEPB: INHALATION_SPRAY | RESPIRATORY_TRACT | 3 refills | Status: DC

## 2020-06-28 MED ORDER — CETIRIZINE HCL 10 MG PO TABS: 10.0000 mg | ORAL_TABLET | Freq: Every day | ORAL | 5 refills | Status: DC

## 2020-06-28 MED ORDER — BUDESONIDE 32 MCG/ACT NA SUSP: NASAL | 5 refills | Status: DC

## 2020-06-28 MED ORDER — ALBUTEROL SULFATE HFA 108 (90 BASE) MCG/ACT IN AERS: 2.0000 | INHALATION_SPRAY | Freq: Four times a day (QID) | RESPIRATORY_TRACT | 1 refills | Status: DC | PRN

## 2020-06-28 MED ORDER — MONTELUKAST SODIUM 10 MG PO TABS: 10.0000 mg | ORAL_TABLET | Freq: Every day | ORAL | 5 refills | Status: DC

## 2020-06-28 NOTE — Progress Notes (Signed)
Follow-up Note  RE: Rachel Hooper MRN: [id number] DOB: [birth year] Date of Office Visit: 06/28/2020   History of present illness: Rachel Hooper is a 39 y.o. female presenting today for follow-up of asthma, allergic rhinitis and hymenoptera allergy.  She was last seen in the office on 05/06/2019 by myself.  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  She had to have surgery in Jan 2021 on the eye and had to have a shunt place.  She is on imuran for treatment.  She states she does have vision in the eye. In regards to her asthma she has been doing well.  She states she will use her Pulmicort mostly of 1 puff a day but depending on symptoms and will do between 1-4.  She continues to take her Singulair.  She has not had any need for her rescue inhaler at this time.  There have been no ED or urgent care visits related to her breathing nor any systemic steroid needs for her breathing. With her allergies she states as long as she is doing her Zyrtec, nasal budesonide, Singulair she has good control. She continues on venom immunotherapy right now at 8-week maintenance.  She states she would like to continue on venom immunotherapy indefinitely as having a sting reaction makes her very nervous. She is fully vaccinated against Covid and has had booster due to immunocompromise state secondary to Imuran use.  Review of systems: Review of Systems  Constitutional: Negative.   HENT: Negative.   Respiratory: Negative.   Cardiovascular: Negative.   Gastrointestinal: Negative.   Musculoskeletal: Negative.   Skin: Negative.   Neurological: Negative.     All other systems negative unless noted above in HPI  Past medical/social/surgical/family history have been reviewed and are unchanged unless specifically indicated below.  No changes  Medication List: Current Outpatient Medications  Medication Sig Dispense Refill  .  albuterol (VENTOLIN HFA) 108 (90 Base) MCG/ACT inhaler Inhale 2 puffs into the lungs every 6 (six) hours as needed for wheezing or shortness of breath. 18 g 1  . aspirin 81 MG tablet Take 81 mg by mouth daily.    . budesonide (PULMICORT FLEXHALER) 180 MCG/ACT inhaler INHALE TWO PUFFS BY MOUTH INTO THE LUNGS TWICE A DAY TO PREVENT COUGH OR WHEEZE. RINSE, GARGLE AND SPIT AFTER USE. 3 each 3  . budesonide (RHINOCORT ALLERGY) 32 MCG/ACT nasal spray ONE SPRAY EACH NOSTRIL TWICE A DAY FOR NASAL CONGESTION OR DRAINAGE. 8.6 g 5  . cetirizine (ZYRTEC) 10 MG tablet Take 1 tablet (10 mg total) by mouth at bedtime. 30 tablet 5  . CONTRAVE 8-90 MG TB12     . EPINEPHrine 0.3 mg/0.3 mL IJ SOAJ injection INJECT 0.3 MLS (0.3 MG TOTAL) INTO THE MUSCLE ONCE FOR 1 DOSE. USE AS DIRECTED FOR SEVERE ALLERGIC REACTION. 2 each 2  . LARIN 1.5/30 1.5-30 MG-MCG tablet     . magnesium oxide-pyridoxine (BEELITH) 362-20 MG TABS Take 1 tablet by mouth every evening.     . metoprolol succinate (TOPROL-XL) 100 MG 24 hr tablet Take 100 mg by mouth at bedtime. Take with or immediately following a meal.    . montelukast (SINGULAIR) 10 MG tablet Take 1 tablet (10 mg total) by mouth at bedtime. 90 tablet 5  . omeprazole (PRILOSEC) 20 MG capsule Take 1 capsule (20 mg total) by mouth daily. 30 capsule 5  . potassium citrate (UROCIT-K) 10 MEQ (1080 MG)  SR tablet Take 20 mEq by mouth every evening. Reported on 03/04/2016    . timolol (TIMOPTIC) 0.5 % ophthalmic solution     . valACYclovir (VALTREX) 500 MG tablet      No current facility-administered medications for this visit.     Known medication allergies: Allergies  Allergen Reactions  . Bee Venom Anaphylaxis    And wasp venom  . Pneumococcal Vaccines Anaphylaxis  . Mixed Vespid Venom   . Wasp Venom      Physical examination: Blood pressure 112/78, pulse 71, temperature 98.5 F (36.9 C), temperature source Temporal, resp. rate 16, height 5\' 5"  (1.651 m), weight 156 lb (70.8  kg), SpO2 99 %.  General: Alert, interactive, in no acute distress. HEENT: PERRLA, TMs pearly gray, turbinates non-edematous without discharge, post-pharynx non erythematous. Neck: Supple without lymphadenopathy. Lungs: Clear to auscultation without wheezing, rhonchi or rales. {no increased work of breathing. CV: Normal S1, S2 without murmurs. Abdomen: Nondistended, nontender. Skin: Warm and dry, without lesions or rashes. Extremities:  No clubbing, cyanosis or edema. Neuro:   Grossly intact.  Diagnositics/Labs:  Spirometry: FEV1: 2.82 L 90%, FVC: 3.8 L 99%, ratio consistent with Nonobstructive pattern  Assessment and plan:   Asthma, mild persistent -Under good control - Continue Pulmicort 180- 1-2 puffs twice a day to prevent coughing or wheezing.  If having respiratory illness or flare increase to max dosing 2 puffs twice a day. - Montelukast 10 mg once a day for a coughing or wheezing - Ventolin 2 puffs every 4-6 hours if needed for wheezing, coughing, shortness of breath or chest tightness.  Monitor frequency of use.  Asthma control goals:   Full participation in all desired activities (may need albuterol before activity)  Albuterol use two time or less a week on average (not counting use with activity)  Cough interfering with sleep two time or less a month  Oral steroids no more than once a year  No hospitalizations  Allergic rhinitis   - Zyrtec 10 mg once a day for runny nose  - Rhinocort 1-2 spray per nostril twice a day for stuffy nose  - Montelukast as above  Venom allergy  - continue venom injections and per recent data will increase maintenance dosing at this time to every 10 weeks with eventual goal of maintenance every 12 weeks of able - If you have an insect sting take Benadryl 50 mg every 4 hours and if you have life-threatening symptoms inject him with EpiPen 0.3 mg  Follow-up yearly or sooner if needed  I appreciate the opportunity to take part in  Rachel Hooper's care. Please do not hesitate to contact me with questions.  Sincerely,   Prudy Feeler, MD Allergy/Immunology [facility]

## 2020-06-28 NOTE — Patient Instructions (Addendum)
Asthma - Continue Pulmicort 180- 1-2 puffs twice a day to prevent coughing or wheezing.  If having respiratory illness or flare increase to max dosing 2 puffs twice a day. - Montelukast 10 mg once a day for a coughing or wheezing - Ventolin 2 puffs every 4-6 hours if needed for wheezing, coughing, shortness of breath or chest tightness.  Monitor frequency of use.  Asthma control goals:   Full participation in all desired activities (may need albuterol before activity)  Albuterol use two time or less a week on average (not counting use with activity)  Cough interfering with sleep two time or less a month  Oral steroids no more than once a year  No hospitalizations  Allergic rhinitis   - Zyrtec 10 mg once a day for runny nose  - Rhinocort 1-2 spray per nostril twice a day for stuffy nose  - Montelukast as above  Venom allergy  - continue venom injections and per recent data will increase maintenance dosing at this time to every 10 weeks with eventual goal of maintenance every 12 weeks of able - If you have an insect sting take Benadryl 50 mg every 4 hours and if you have life-threatening symptoms inject him with EpiPen 0.3 mg    Follow-up yearly or sooner if needed

## 2020-06-29 MED FILL — azaTHIOprine 50 MG TABS: 50 | 90 days supply | Qty: 270 | Fill #0

## 2020-07-03 DIAGNOSIS — H4061X0 Glaucoma secondary to drugs, right eye, stage unspecified: Secondary | ICD-10-CM | POA: Diagnosis not present

## 2020-07-03 DIAGNOSIS — T380X5A Adverse effect of glucocorticoids and synthetic analogues, initial encounter: Secondary | ICD-10-CM | POA: Diagnosis not present

## 2020-07-04 DIAGNOSIS — H209 Unspecified iridocyclitis: Secondary | ICD-10-CM | POA: Diagnosis not present

## 2020-07-04 DIAGNOSIS — B0052 Herpesviral keratitis: Secondary | ICD-10-CM | POA: Diagnosis not present

## 2020-07-04 DIAGNOSIS — T380X5A Adverse effect of glucocorticoids and synthetic analogues, initial encounter: Secondary | ICD-10-CM | POA: Diagnosis not present

## 2020-07-04 DIAGNOSIS — H4061X0 Glaucoma secondary to drugs, right eye, stage unspecified: Secondary | ICD-10-CM | POA: Diagnosis not present

## 2020-07-11 ENCOUNTER — Other Ambulatory Visit: Payer: Self-pay

## 2020-07-11 ENCOUNTER — Other Ambulatory Visit: Payer: Self-pay | Admitting: Urology

## 2020-07-11 ENCOUNTER — Other Ambulatory Visit
Admission: RE | Admit: 2020-07-11 | Discharge: 2020-07-11 | Disposition: A | Discharge status: Home / Self Care | Payer: 59 | Attending: Ophthalmology | Admitting: Ophthalmology

## 2020-07-11 ENCOUNTER — Telehealth: Payer: Self-pay

## 2020-07-11 DIAGNOSIS — H209 Unspecified iridocyclitis: Secondary | ICD-10-CM | POA: Diagnosis not present

## 2020-07-11 DIAGNOSIS — Z79899 Other long term (current) drug therapy: Secondary | ICD-10-CM | POA: Insufficient documentation

## 2020-07-11 DIAGNOSIS — Z5181 Encounter for therapeutic drug level monitoring: Secondary | ICD-10-CM | POA: Diagnosis not present

## 2020-07-11 LAB — CBC WITH DIFFERENTIAL/PLATELET
Abs Immature Granulocytes: 0.03 10*3/uL (ref 0.00–0.07)
Basophils Absolute: 0.1 10*3/uL (ref 0.0–0.1)
Basophils Relative: 1 %
Eosinophils Absolute: 0.1 10*3/uL (ref 0.0–0.5)
Eosinophils Relative: 1 %
HCT: 39.4 % (ref 36.0–46.0)
Hemoglobin: 13.8 g/dL (ref 12.0–15.0)
Immature Granulocytes: 0 %
Lymphocytes Relative: 25 %
Lymphs Abs: 1.8 10*3/uL (ref 0.7–4.0)
MCH: 37.6 pg — ABNORMAL HIGH (ref 26.0–34.0)
MCHC: 35 g/dL (ref 30.0–36.0)
MCV: 107.4 fL — ABNORMAL HIGH (ref 80.0–100.0)
Monocytes Absolute: 0.5 10*3/uL (ref 0.1–1.0)
Monocytes Relative: 8 %
Neutro Abs: 4.7 10*3/uL (ref 1.7–7.7)
Neutrophils Relative %: 65 %
Platelets: 399 10*3/uL (ref 150–400)
RBC: 3.67 MIL/uL — ABNORMAL LOW (ref 3.87–5.11)
RDW: 12.6 % (ref 11.5–15.5)
WBC: 7.2 10*3/uL (ref 4.0–10.5)
nRBC: 0 % (ref 0.0–0.2)

## 2020-07-11 LAB — COMPREHENSIVE METABOLIC PANEL
ALT: 21 U/L (ref 0–44)
AST: 22 U/L (ref 15–41)
Albumin: 4.3 g/dL (ref 3.5–5.0)
Alkaline Phosphatase: 33 U/L — ABNORMAL LOW (ref 38–126)
Anion gap: 9 (ref 5–15)
BUN: 18 mg/dL (ref 6–20)
CO2: 24 mmol/L (ref 22–32)
Calcium: 9.3 mg/dL (ref 8.9–10.3)
Chloride: 104 mmol/L (ref 98–111)
Creatinine, Ser: 0.71 mg/dL (ref 0.44–1.00)
GFR calc Af Amer: >60 mL/min (ref >60)
GFR calc non Af Amer: >60 mL/min (ref >60)
Glucose, Bld: 86 mg/dL (ref 70–99)
Potassium: 4.1 mmol/L (ref 3.5–5.1)
Sodium: 137 mmol/L (ref 135–145)
Total Bilirubin: 1.2 mg/dL (ref 0.3–1.2)
Total Protein: 7.6 g/dL (ref 6.5–8.1)

## 2020-07-11 MED ORDER — EPINEPHRINE 0.3 MG/0.3ML IJ SOAJ: INTRAMUSCULAR | 1 refills | Status: DC

## 2020-07-11 NOTE — Telephone Encounter (Signed)
Patient called to get a refill on her EPI PEN. Patient request it has to be twin pack.   [place]

## 2020-07-11 NOTE — Telephone Encounter (Signed)
Sent in refills 

## 2020-07-27 ENCOUNTER — Ambulatory Visit: Payer: Self-pay

## 2020-08-04 ENCOUNTER — Other Ambulatory Visit: Payer: Self-pay | Admitting: Obstetrics and Gynecology

## 2020-08-04 DIAGNOSIS — Z6826 Body mass index (BMI) 26.0-26.9, adult: Secondary | ICD-10-CM | POA: Diagnosis not present

## 2020-08-04 DIAGNOSIS — Z01419 Encounter for gynecological examination (general) (routine) without abnormal findings: Secondary | ICD-10-CM | POA: Diagnosis not present

## 2020-08-04 DIAGNOSIS — N809 Endometriosis, unspecified: Secondary | ICD-10-CM | POA: Diagnosis not present

## 2020-08-08 ENCOUNTER — Ambulatory Visit (INDEPENDENT_AMBULATORY_CARE_PROVIDER_SITE_OTHER): Payer: 59

## 2020-08-08 ENCOUNTER — Other Ambulatory Visit: Payer: Self-pay

## 2020-08-08 DIAGNOSIS — T63441D Toxic effect of venom of bees, accidental (unintentional), subsequent encounter: Secondary | ICD-10-CM | POA: Diagnosis not present

## 2020-08-09 ENCOUNTER — Other Ambulatory Visit: Payer: Self-pay | Admitting: Obstetrics and Gynecology

## 2020-08-24 DIAGNOSIS — H0102A Squamous blepharitis right eye, upper and lower eyelids: Secondary | ICD-10-CM | POA: Diagnosis not present

## 2020-08-24 DIAGNOSIS — H17821 Peripheral opacity of cornea, right eye: Secondary | ICD-10-CM | POA: Diagnosis not present

## 2020-08-24 DIAGNOSIS — H0102B Squamous blepharitis left eye, upper and lower eyelids: Secondary | ICD-10-CM | POA: Diagnosis not present

## 2020-08-24 DIAGNOSIS — H04123 Dry eye syndrome of bilateral lacrimal glands: Secondary | ICD-10-CM | POA: Diagnosis not present

## 2020-08-24 DIAGNOSIS — H16301 Unspecified interstitial keratitis, right eye: Secondary | ICD-10-CM | POA: Diagnosis not present

## 2020-08-24 DIAGNOSIS — Z9889 Other specified postprocedural states: Secondary | ICD-10-CM | POA: Diagnosis not present

## 2020-08-24 DIAGNOSIS — H401112 Primary open-angle glaucoma, right eye, moderate stage: Secondary | ICD-10-CM | POA: Diagnosis not present

## 2020-09-25 DIAGNOSIS — N200 Calculus of kidney: Secondary | ICD-10-CM | POA: Diagnosis not present

## 2020-09-26 ENCOUNTER — Other Ambulatory Visit: Payer: Self-pay | Admitting: Ophthalmology

## 2020-09-26 DIAGNOSIS — H209 Unspecified iridocyclitis: Secondary | ICD-10-CM | POA: Diagnosis not present

## 2020-09-26 DIAGNOSIS — H4061X0 Glaucoma secondary to drugs, right eye, stage unspecified: Secondary | ICD-10-CM | POA: Diagnosis not present

## 2020-09-26 DIAGNOSIS — T380X5A Adverse effect of glucocorticoids and synthetic analogues, initial encounter: Secondary | ICD-10-CM | POA: Diagnosis not present

## 2020-09-26 DIAGNOSIS — B0052 Herpesviral keratitis: Secondary | ICD-10-CM | POA: Diagnosis not present

## 2020-09-28 DIAGNOSIS — T380X5A Adverse effect of glucocorticoids and synthetic analogues, initial encounter: Secondary | ICD-10-CM | POA: Diagnosis not present

## 2020-09-28 DIAGNOSIS — H4061X0 Glaucoma secondary to drugs, right eye, stage unspecified: Secondary | ICD-10-CM | POA: Diagnosis not present

## 2020-09-29 DIAGNOSIS — I83813 Varicose veins of bilateral lower extremities with pain: Secondary | ICD-10-CM | POA: Diagnosis not present

## 2020-10-02 DIAGNOSIS — T380X5A Adverse effect of glucocorticoids and synthetic analogues, initial encounter: Secondary | ICD-10-CM | POA: Diagnosis not present

## 2020-10-02 DIAGNOSIS — B0052 Herpesviral keratitis: Secondary | ICD-10-CM | POA: Diagnosis not present

## 2020-10-02 DIAGNOSIS — H2011 Chronic iridocyclitis, right eye: Secondary | ICD-10-CM | POA: Diagnosis not present

## 2020-10-02 DIAGNOSIS — H4061X0 Glaucoma secondary to drugs, right eye, stage unspecified: Secondary | ICD-10-CM | POA: Diagnosis not present

## 2020-10-12 ENCOUNTER — Other Ambulatory Visit: Payer: Self-pay

## 2020-10-12 ENCOUNTER — Telehealth: Payer: Self-pay | Admitting: *Deleted

## 2020-10-12 MED ORDER — PULMICORT FLEXHALER 180 MCG/ACT IN AEPB: INHALATION_SPRAY | RESPIRATORY_TRACT | 3 refills | Status: DC

## 2020-10-12 NOTE — Telephone Encounter (Signed)
Patient called because the pharmacy told her they had sent a prior authorization request electronically and via fax but we have not received anything. I sent the medication as a refill given it had not been sent recently. I did go ahead and submit a prior authorization on covermymeds. It is currently pending approval/denial. Pharmacy is aware that the prior auth has been submitted.

## 2020-10-12 NOTE — Telephone Encounter (Signed)
Prior auth approved, faxed to pharmacy, sent to scan center and patient informed via voicemail.

## 2020-10-12 NOTE — Telephone Encounter (Signed)
PA has been submitted through CoverMyMeds for Pulmicort Flexhaler and has been approved. PA has been faxed to patient's pharmacy, labeled, and placed in bulk scanning. Called and spoke with patient and advised. Patient verbalized understanding.

## 2020-10-16 DIAGNOSIS — I83813 Varicose veins of bilateral lower extremities with pain: Secondary | ICD-10-CM | POA: Diagnosis not present

## 2020-10-17 ENCOUNTER — Ambulatory Visit (INDEPENDENT_AMBULATORY_CARE_PROVIDER_SITE_OTHER): Payer: 59 | Admitting: *Deleted

## 2020-10-17 ENCOUNTER — Other Ambulatory Visit: Payer: Self-pay

## 2020-10-17 DIAGNOSIS — Z0000 Encounter for general adult medical examination without abnormal findings: Secondary | ICD-10-CM | POA: Diagnosis not present

## 2020-10-17 DIAGNOSIS — T63441D Toxic effect of venom of bees, accidental (unintentional), subsequent encounter: Secondary | ICD-10-CM | POA: Diagnosis not present

## 2020-10-17 DIAGNOSIS — J45909 Unspecified asthma, uncomplicated: Secondary | ICD-10-CM | POA: Diagnosis not present

## 2020-10-17 DIAGNOSIS — K219 Gastro-esophageal reflux disease without esophagitis: Secondary | ICD-10-CM | POA: Diagnosis not present

## 2020-10-17 DIAGNOSIS — E7800 Pure hypercholesterolemia, unspecified: Secondary | ICD-10-CM | POA: Diagnosis not present

## 2020-10-17 DIAGNOSIS — Z889 Allergy status to unspecified drugs, medicaments and biological substances status: Secondary | ICD-10-CM | POA: Diagnosis not present

## 2020-10-17 DIAGNOSIS — I10 Essential (primary) hypertension: Secondary | ICD-10-CM | POA: Diagnosis not present

## 2020-10-17 DIAGNOSIS — H409 Unspecified glaucoma: Secondary | ICD-10-CM | POA: Diagnosis not present

## 2020-10-17 DIAGNOSIS — G720 Drug-induced myopathy: Secondary | ICD-10-CM | POA: Diagnosis not present

## 2020-10-17 DIAGNOSIS — J309 Allergic rhinitis, unspecified: Secondary | ICD-10-CM | POA: Diagnosis not present

## 2020-10-19 ENCOUNTER — Other Ambulatory Visit: Payer: Self-pay | Admitting: Physician Assistant

## 2020-10-19 DIAGNOSIS — Z0000 Encounter for general adult medical examination without abnormal findings: Secondary | ICD-10-CM | POA: Diagnosis not present

## 2020-10-19 DIAGNOSIS — Z13220 Encounter for screening for lipoid disorders: Secondary | ICD-10-CM | POA: Diagnosis not present

## 2020-10-23 ENCOUNTER — Other Ambulatory Visit: Payer: Self-pay | Admitting: Physician Assistant

## 2020-11-13 DIAGNOSIS — B0052 Herpesviral keratitis: Secondary | ICD-10-CM | POA: Diagnosis not present

## 2020-11-13 DIAGNOSIS — H2011 Chronic iridocyclitis, right eye: Secondary | ICD-10-CM | POA: Diagnosis not present

## 2020-11-13 DIAGNOSIS — T380X5A Adverse effect of glucocorticoids and synthetic analogues, initial encounter: Secondary | ICD-10-CM | POA: Diagnosis not present

## 2020-11-13 DIAGNOSIS — H4061X0 Glaucoma secondary to drugs, right eye, stage unspecified: Secondary | ICD-10-CM | POA: Diagnosis not present

## 2020-11-28 ENCOUNTER — Other Ambulatory Visit: Payer: Self-pay | Admitting: Internal Medicine

## 2020-11-28 ENCOUNTER — Ambulatory Visit: Payer: 59 | Attending: Internal Medicine

## 2020-11-28 DIAGNOSIS — Z23 Encounter for immunization: Secondary | ICD-10-CM

## 2020-11-28 NOTE — Progress Notes (Signed)
   Covid-19 Vaccination Clinic  Name:  Rachel Hooper    MRN: [id number] DOB: August 01, 1981  11/28/2020  [name] was observed post Covid-19 immunization for 30 minutes based on pre-vaccination screening without incident. She was provided with Vaccine Information Sheet and instruction to access the V-Safe system.   [name] was instructed to call 911 with any severe reactions post vaccine: Marland Kitchen Difficulty breathing  . Swelling of face and throat  . A fast heartbeat  . A bad rash all over body  . Dizziness and weakness   Immunizations Administered    Name Date Dose VIS Date Route   PFIZER Comrnaty([place] TOP) Covid-19 Vaccine 11/28/2020  8:56 AM 0.3 mL 09/28/2020 Intramuscular   Manufacturer: [place]   Lot: [id number]   NDC: [phone]

## 2021-01-01 ENCOUNTER — Other Ambulatory Visit: Payer: Self-pay

## 2021-01-01 ENCOUNTER — Other Ambulatory Visit
Admission: RE | Admit: 2021-01-01 | Discharge: 2021-01-01 | Disposition: A | Discharge status: Home / Self Care | Payer: 59 | Attending: Ophthalmology | Admitting: Ophthalmology

## 2021-01-01 DIAGNOSIS — H209 Unspecified iridocyclitis: Secondary | ICD-10-CM | POA: Diagnosis not present

## 2021-01-01 LAB — COMPREHENSIVE METABOLIC PANEL
ALT: 20 U/L (ref 0–44)
AST: 23 U/L (ref 15–41)
Albumin: 4.2 g/dL (ref 3.5–5.0)
Alkaline Phosphatase: 36 U/L — ABNORMAL LOW (ref 38–126)
Anion gap: 8 (ref 5–15)
BUN: 20 mg/dL (ref 6–20)
CO2: 24 mmol/L (ref 22–32)
Calcium: 9.3 mg/dL (ref 8.9–10.3)
Chloride: 107 mmol/L (ref 98–111)
Creatinine, Ser: 0.63 mg/dL (ref 0.44–1.00)
GFR, Estimated: >60 mL/min (ref >60)
Glucose, Bld: 86 mg/dL (ref 70–99)
Potassium: 4 mmol/L (ref 3.5–5.1)
Sodium: 139 mmol/L (ref 135–145)
Total Bilirubin: 1.1 mg/dL (ref 0.3–1.2)
Total Protein: 7.6 g/dL (ref 6.5–8.1)

## 2021-01-01 LAB — CBC WITH DIFFERENTIAL/PLATELET
Abs Immature Granulocytes: 0.03 10*3/uL (ref 0.00–0.07)
Basophils Absolute: 0.1 10*3/uL (ref 0.0–0.1)
Basophils Relative: 1 %
Eosinophils Absolute: 0.3 10*3/uL (ref 0.0–0.5)
Eosinophils Relative: 3 %
HCT: 41 % (ref 36.0–46.0)
Hemoglobin: 14.8 g/dL (ref 12.0–15.0)
Immature Granulocytes: 0 %
Lymphocytes Relative: 29 %
Lymphs Abs: 2.2 10*3/uL (ref 0.7–4.0)
MCH: 38.8 pg — ABNORMAL HIGH (ref 26.0–34.0)
MCHC: 36.1 g/dL — ABNORMAL HIGH (ref 30.0–36.0)
MCV: 107.6 fL — ABNORMAL HIGH (ref 80.0–100.0)
Monocytes Absolute: 0.6 10*3/uL (ref 0.1–1.0)
Monocytes Relative: 9 %
Neutro Abs: 4.4 10*3/uL (ref 1.7–7.7)
Neutrophils Relative %: 58 %
Platelets: 378 10*3/uL (ref 150–400)
RBC: 3.81 MIL/uL — ABNORMAL LOW (ref 3.87–5.11)
RDW: 12 % (ref 11.5–15.5)
WBC: 7.5 10*3/uL (ref 4.0–10.5)
nRBC: 0 % (ref 0.0–0.2)

## 2021-01-02 ENCOUNTER — Other Ambulatory Visit (HOSPITAL_COMMUNITY): Payer: Self-pay | Admitting: Ophthalmology

## 2021-01-02 DIAGNOSIS — H209 Unspecified iridocyclitis: Secondary | ICD-10-CM | POA: Diagnosis not present

## 2021-01-02 DIAGNOSIS — H4061X0 Glaucoma secondary to drugs, right eye, stage unspecified: Secondary | ICD-10-CM | POA: Diagnosis not present

## 2021-01-02 DIAGNOSIS — T380X5A Adverse effect of glucocorticoids and synthetic analogues, initial encounter: Secondary | ICD-10-CM | POA: Diagnosis not present

## 2021-01-02 DIAGNOSIS — B0052 Herpesviral keratitis: Secondary | ICD-10-CM | POA: Diagnosis not present

## 2021-01-05 ENCOUNTER — Telehealth: Payer: Self-pay | Admitting: Physician Assistant

## 2021-01-05 DIAGNOSIS — Z3183 Encounter for assisted reproductive fertility procedure cycle: Secondary | ICD-10-CM | POA: Diagnosis not present

## 2021-01-05 NOTE — Telephone Encounter (Signed)
Called pt about Evusheld (tixagevimab co-packaged with cilgavimab) for pre-exposure prophylaxis for prevention of coronavirus disease 2019 (COVID-19) caused by the SARS-CoV-2 virus. The patient is a candidate for this therapy given increased risk for severe disease caused by immunosuppression.    Unable to reach pt- left message.   Angelena Form PA-C  [facility]

## 2021-01-06 ENCOUNTER — Encounter (HOSPITAL_COMMUNITY): Payer: 59

## 2021-01-06 ENCOUNTER — Other Ambulatory Visit: Payer: Self-pay | Admitting: Physician Assistant

## 2021-01-06 DIAGNOSIS — B0230 Zoster ocular disease, unspecified: Secondary | ICD-10-CM

## 2021-01-06 DIAGNOSIS — D849 Immunodeficiency, unspecified: Secondary | ICD-10-CM

## 2021-01-06 NOTE — Progress Notes (Signed)
I connected by phone with Rachel Hooper on 01/06/2021, 8:16 AM to discuss the potential use of a new treatment, tixagevimab/cilgavimab, for pre-exposure prophylaxis for prevention of coronavirus disease 2019 (COVID-19) caused by the SARS-CoV-2 virus.  This patient is a 40 y.o. female that meets the FDA criteria for Emergency Use Authorization of tixagevimab/cilgavimab for pre-exposure prophylaxis of COVID-19 disease. Pt meets following criteria:  Age >12 yr and weight > 40kg  Not currently infected with SARS-CoV-2 and has no known recent exposure to an individual infected with SARS-CoV-2 AND o Who has moderate to severe immune compromise due to a medical condition or receipt of immunosuppressive medications or treatments and may not mount an adequate immune response to COVID-19 vaccination or  o Vaccination with any available COVID-19 vaccine, according to the approved or authorized schedule, is not recommended due to a history of severe adverse reaction (e.g., severe allergic reaction) to a COVID-19 vaccine(s) and/or COVID-19 vaccine component(s).  o Patient meets the following definition of mod-severe immune compromised status: 8. Other groups not previously mentioned: 4. 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 FDA has authorized the emergency use of tixagevimab/cilgavimab for the pre-exposure prophylaxis of COVID-19 in patients with moderate-severe immunocompromised status, who meet above EUA criteria.  2. The significant known and potential risks and benefits of COVID monoclonal antibody, and the extent to which such potential risks and benefits are unknown.  3. Information on available alternative treatments and the risks and benefits of those alternatives, including clinical trials.  4. The patient or parent/caregiver has the option to accept or refuse COVID monoclonal  antibody treatment.  After reviewing this information with the patient, agree to receive tixagevimab/cilgavimab  Angelena Form, PA-C, 01/06/2021, 8:16 AM

## 2021-01-09 ENCOUNTER — Ambulatory Visit (INDEPENDENT_AMBULATORY_CARE_PROVIDER_SITE_OTHER): Payer: 59 | Admitting: *Deleted

## 2021-01-09 ENCOUNTER — Other Ambulatory Visit: Payer: Self-pay

## 2021-01-09 DIAGNOSIS — T63441D Toxic effect of venom of bees, accidental (unintentional), subsequent encounter: Secondary | ICD-10-CM

## 2021-01-10 ENCOUNTER — Ambulatory Visit (HOSPITAL_COMMUNITY)
Admission: RE | Admit: 2021-01-10 | Discharge: 2021-01-10 | Disposition: A | Discharge status: Home / Self Care | Payer: 59 | Source: Ambulatory Visit | Attending: Physician Assistant | Admitting: Physician Assistant

## 2021-01-10 DIAGNOSIS — D849 Immunodeficiency, unspecified: Secondary | ICD-10-CM | POA: Diagnosis not present

## 2021-01-10 DIAGNOSIS — B0230 Zoster ocular disease, unspecified: Secondary | ICD-10-CM | POA: Insufficient documentation

## 2021-01-10 MED ORDER — TIXAGEVIMAB (PART OF EVUSHELD) INJECTION
300.0000 mg | Freq: Once | INTRAMUSCULAR | Status: AC
  Administered 2021-01-10: 300 mg via INTRAMUSCULAR
  Filled 2021-01-10: qty 3

## 2021-01-10 MED ORDER — CILGAVIMAB (PART OF EVUSHELD) INJECTION
300.0000 mg | Freq: Once | INTRAMUSCULAR | Status: AC
  Administered 2021-01-10: 300 mg via INTRAMUSCULAR
  Filled 2021-01-10: qty 3

## 2021-01-10 MED ORDER — FAMOTIDINE IN NACL 20-0.9 MG/50ML-% IV SOLN: 20.0000 mg | Freq: Once | INTRAVENOUS | Status: DC | PRN

## 2021-01-10 MED ORDER — METHYLPREDNISOLONE SODIUM SUCC 125 MG IJ SOLR: 125.0000 mg | Freq: Once | INTRAMUSCULAR | Status: DC | PRN

## 2021-01-10 MED ORDER — DIPHENHYDRAMINE HCL 50 MG/ML IJ SOLN: 50.0000 mg | Freq: Once | INTRAMUSCULAR | Status: DC | PRN

## 2021-01-10 MED ORDER — ALBUTEROL SULFATE HFA 108 (90 BASE) MCG/ACT IN AERS
2.0000 | INHALATION_SPRAY | Freq: Once | RESPIRATORY_TRACT | Status: DC | PRN
  Filled 2021-01-10: qty 6.7

## 2021-01-10 MED ORDER — EPINEPHRINE 0.3 MG/0.3ML IJ SOAJ: 0.3000 mg | Freq: Once | INTRAMUSCULAR | Status: DC | PRN

## 2021-01-26 ENCOUNTER — Encounter (HOSPITAL_BASED_OUTPATIENT_CLINIC_OR_DEPARTMENT_OTHER): Payer: Self-pay

## 2021-01-26 ENCOUNTER — Other Ambulatory Visit: Payer: Self-pay

## 2021-01-26 ENCOUNTER — Ambulatory Visit (INDEPENDENT_AMBULATORY_CARE_PROVIDER_SITE_OTHER): Payer: 59 | Admitting: Internal Medicine

## 2021-01-26 ENCOUNTER — Other Ambulatory Visit
Admission: RE | Admit: 2021-01-26 | Discharge: 2021-01-26 | Disposition: A | Discharge status: Home / Self Care | Payer: 59 | Source: Ambulatory Visit | Attending: Internal Medicine | Admitting: Internal Medicine

## 2021-01-26 ENCOUNTER — Encounter: Payer: Self-pay | Admitting: Internal Medicine

## 2021-01-26 VITALS — BP 98/70 | HR 74 | Ht 65.0 in | Wt 171.0 lb

## 2021-01-26 DIAGNOSIS — E785 Hyperlipidemia, unspecified: Secondary | ICD-10-CM

## 2021-01-26 DIAGNOSIS — Z789 Other specified health status: Secondary | ICD-10-CM | POA: Diagnosis not present

## 2021-01-26 MED ORDER — METOPROLOL SUCCINATE ER 25 MG PO TB24
ORAL_TABLET | ORAL | 12 refills | Status: DC
  Filled 2021-01-26: qty 30, 30d supply, fill #0
  Filled 2021-02-14: qty 90, 90d supply, fill #1
  Filled 2021-06-05: qty 90, 90d supply, fill #2
  Filled 2021-06-20 – 2021-09-04 (×2): qty 90, 90d supply, fill #3

## 2021-01-26 NOTE — Progress Notes (Signed)
LIPID CLINIC CONSULT NOTE  Chief Complaint:  Manage dyslipidemia  Primary Care Physician: Lennie Odor, PA  Primary Cardiologist:  No primary care provider on file.  HPI:  Rachel Hooper is a 40 y.o. female who is being seen today for the evaluation of dyslipidemia at the request of [place], [place], [place].  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.  She has a longstanding history of dyslipidemia and in the past she said had triglycerides as high as over 1500.  She is also had elevated LDL cholesterol.  She is lost recently a lot of weight but unfortunately had a problem where she developed ocular shingles.  This is required steroids and immunosuppression and currently she is on azathioprine have been being managed by an ocular immunologist at [facility].  She has had marked improvement in her lipids on therapy but reports significant fatigue.  Part of this was thought to be due to bradycardia on higher dose beta-blocker.  This was because she had had a history of tachycardia or palpitations related to stimulant weight loss medications.  She currently is on Contrave.  She decreased the dose of her metoprolol from 100 to 50 mg with improvement in heart rate although blood pressure today was again low at 98/70.  She does have family history of stroke in her father early at age 30.  Other medical problems include glaucoma, asthma, reflux, and PCOS.  She also had issues with infertility.  She thinks that part of her symptoms of fatigue might be related to her statin but has not tried reducing the dose or taking a statin holiday.  Her last lipid profile in June 2021 showed total cholesterol 181, triglycerides 207, HDL 48 and LDL of 98.  PMHx:  Past Medical History:  Diagnosis Date  . Asthma   . Endometriosis   . GERD (gastroesophageal reflux disease)   . History of kidney stones   .  Hydrosalpinx    left  . Hyperlipidemia    diet controlled, no med  . Hypertension   . PAI-1 4G/4G genotype   . PCOS (polycystic ovarian syndrome)   . PONV (postoperative nausea and vomiting)    severe  . Seasonal allergies     Past Surgical History:  Procedure Laterality Date  . ANTERIOR CRUCIATE LIGAMENT REPAIR Right 1995  . CHROMOPERTUBATION N/A 05/03/2015   Procedure: CHROMOPERTUBATION;  Surgeon: Governor Specking, MD;  Location: [facility];  Service: Gynecology;  Laterality: N/A;  . EXTRACORPOREAL SHOCK WAVE LITHOTRIPSY  2008  . LAPAROSCOPY N/A 03/28/2015   Procedure: LAPAROSCOPY DIAGNOSTIC;  Surgeon: Brien Few, MD;  Location: [facility];  Service: Gynecology;  Laterality: N/A;  . LAPAROSCOPY N/A 05/03/2015   Procedure: LAPAROSCOPY, EXCISION ENDOMETRIOSIS, ENTERO LYSIS, WITH ENDOMETRIAL BIOPSY;  Surgeon: Governor Specking, MD;  Location: [facility];  Service: Gynecology;  Laterality: N/A;  . NASAL SEPTOPLASTY W/ TURBINOPLASTY Bilateral 06/20/2017   Procedure: NASAL SEPTOPLASTY WITH BILATERAL TURBINATE REDUCTION;  Surgeon: Melida Quitter, MD;  Location: [facility];  Service: ENT;  Laterality: Bilateral;  . UNILATERAL SALPINGECTOMY Left 05/03/2015   Procedure: LEFT SALPINGECTOMY;  Surgeon: Governor Specking, MD;  Location: [facility];  Service: Gynecology;  Laterality: Left;  . WISDOM TOOTH EXTRACTION      FAMHx:  Family History  Problem Relation Age of Onset  . Allergic rhinitis Mother   . Asthma Mother   .  Eczema Mother   . Eczema Maternal Grandfather   . Angioedema Neg Hx   . Immunodeficiency Neg Hx   . Urticaria Neg Hx     SOCHx:   reports that she has never smoked. She has never used smokeless tobacco. She reports that she does not drink alcohol and does not use drugs.  ALLERGIES:  Allergies  Allergen Reactions  . Bee Venom Anaphylaxis    And wasp venom  . Pneumococcal Vaccines Anaphylaxis  . Mixed Vespid Venom   . Wasp Venom      ROS: Pertinent items noted in HPI and remainder of comprehensive ROS otherwise negative.  HOME MEDS: Current Outpatient Medications on File Prior to Visit  Medication Sig Dispense Refill  . albuterol (VENTOLIN HFA) 108 (90 Base) MCG/ACT inhaler INHALE 2 PUFFS BY MOUTH EVERY 6 HOURS AS NEEDED FOR WHEEZING OR SHORTNESS OF BREATH. 18 g 1  . aspirin 81 MG tablet Take 81 mg by mouth daily.    Marland Kitchen azaTHIOprine (IMURAN) 50 MG tablet TAKE 1 TABLET BY MOUTH 3 TIMES DAILY 270 tablet 2  . budesonide (PULMICORT FLEXHALER) 180 MCG/ACT inhaler INHALE TWO PUFFS BY MOUTH INTO THE LUNGS TWICE A DAY TO PREVENT COUGH OR WHEEZE. RINSE, GARGLE AND SPIT AFTER USE. 3 each 3  . budesonide (RHINOCORT ALLERGY) 32 MCG/ACT nasal spray ONE SPRAY EACH NOSTRIL TWICE A DAY FOR NASAL CONGESTION OR DRAINAGE. 8.6 g 5  . cetirizine (ZYRTEC) 10 MG tablet Take 1 tablet (10 mg total) by mouth at bedtime. 30 tablet 5  . CONTRAVE 8-90 MG TB12     . EPINEPHrine 0.3 mg/0.3 mL IJ SOAJ injection INJECT 0.3 MLS (0.3 MG TOTAL) INTO THE MUSCLE ONCE FOR 1 DOSE. USE AS DIRECTED FOR SEVERE ALLERGIC REACTION. 2 each 1  . LARIN 1.5/30 1.5-30 MG-MCG tablet     . Loteprednol Etabonate 0.38 % GEL PLACE 1 DROP INTO THE RIGHT EYE IN THE MORNING AND AT BEDTIME. 5 g 5  . magnesium oxide-pyridoxine (BEELITH) 362-20 MG TABS Take 1 tablet by mouth every evening.     . metoprolol succinate (TOPROL-XL) 100 MG 24 hr tablet TAKE 1/2 TABLET (50MG ) BY MOUTH ONCE A DAY 90 tablet 0  . montelukast (SINGULAIR) 10 MG tablet TAKE 1 TABLET (10 MG TOTAL) BY MOUTH AT BEDTIME. 90 tablet 5  . Norethindrone Acetate-Ethinyl Estradiol (LOESTRIN) 1.5-30 MG-MCG tablet TAKE 1 TABLET BY MOUTH DAILY (CONTINUOUS ACTIVE PILLS) 84 tablet 3  . potassium citrate (UROCIT-K) 10 MEQ (1080 MG) SR tablet TAKE 1 TABLET BY MOUTH 2 TIMES DAILY WITH MEALS. 180 tablet 1  . rosuvastatin (CRESTOR) 20 MG tablet TAKE 1 TABLET BY MOUTH ONCE DAILY 90 tablet 3  . timolol (TIMOPTIC) 0.5 %  ophthalmic solution PLACE 1 DROP INTO THE RIGHT EYE DAILY. 5 mL 11  . valACYclovir (VALTREX) 1000 MG tablet TAKE 1 TABLET (1,000 MG TOTAL) BY MOUTH DAILY. 90 tablet 11  . azaTHIOprine (IMURAN) 50 MG tablet TAKE 1 TABLET (50 MG TOTAL) BY MOUTH 3 TIMES DAILY. 270 tablet 2  . COVID-19 mRNA Vac-TriS, Pfizer, SUSP injection USE AS DIRECTED .3 mL 0  . Loteprednol Etabonate 0.38 % GEL PLACE 1 DROP INTO THE RIGHT EYE IN THE MORNING AND AT BEDTIME. 5 g 11  . metoprolol succinate (TOPROL-XL) 100 MG 24 hr tablet Take 100 mg by mouth at bedtime. Take with or immediately following a meal.    . metoprolol succinate (TOPROL-XL) 100 MG 24 hr tablet TAKE 1 TABLET BY MOUTH ONCE DAILY 90  tablet 3  . potassium citrate (UROCIT-K) 10 MEQ (1080 MG) SR tablet Take 20 mEq by mouth every evening. Reported on 03/04/2016    . rosuvastatin (CRESTOR) 20 MG tablet TAKE 1 TABLET BY MOUTH ONCE DAILY 90 tablet 1  . timolol (TIMOPTIC) 0.5 % ophthalmic solution     . valACYclovir (VALTREX) 1000 MG tablet TAKE 1 TABLET BY MOUTH TWICE DAILY. (Patient not taking: Reported on 01/26/2021) 90 tablet 5   No current facility-administered medications on file prior to visit.    LABS/IMAGING: No results found for this or any previous visit (from the past 48 hour(s)). No results found.  LIPID PANEL: No results found for: CHOL, TRIG, HDL, CHOLHDL, VLDL, LDLCALC, LDLDIRECT  WEIGHTS: Wt Readings from Last 3 Encounters:  01/26/21 171 lb (77.6 kg)  06/28/20 156 lb (70.8 kg)  05/06/19 168 lb (76.2 kg)    VITALS: BP 98/70 (BP Location: Left Arm, Patient Position: Sitting)   Pulse 74   Ht 5\' 5"  (1.651 m)   Wt 171 lb (77.6 kg)   SpO2 100%   BMI 28.46 kg/m   EXAM: General appearance: alert and no distress Neck: no carotid bruit, no JVD and thyroid not enlarged, symmetric, no tenderness/mass/nodules Lungs: clear to auscultation bilaterally Heart: regular rate and rhythm, S1, S2 normal, no murmur, click, rub or gallop Abdomen: soft,  non-tender; bowel sounds normal; no masses,  no organomegaly Extremities: extremities normal, atraumatic, no cyanosis or edema Pulses: 2+ and symmetric Skin: Skin color, texture, turgor normal. No rashes or lesions Neurologic: Grossly normal Psych: Pleasant   *Examination chaperoned by Sheral Apley, RN.  EKG: Deferred  ASSESSMENT: 1. Mixed dyslipidemia, goal LDL <100 2. Possible statin intolerance 3. Family history of early stroke  PLAN: 1.   Ms. Kassel has a mixed dyslipidemia and has reached target LDL on high potency rosuvastatin, however she believes she is having side effects with the medication.  Previously she had taken atorvastatin and had significant side effects however seem to have tolerated the rosuvastatin but also thinks that she is having fatigue partially related to this.  Some of her fatigue may be due to bradycardia or hypotension.  Blood pressure is still low today 98/70.  She may wish to consider further reduction of metoprolol from 50 to 25mg  to see if this improves some of her symptoms.  We will plan repeat lipids now including an NMR, LP(a) and APO B and I advised her to take a 2-week statin holiday to see if her symptoms improved significantly.  We may then need to consider different options for therapy.  As she will be 40 later this year, we may consider in the future a coronary calcium score to evaluate if there is any premature atherosclerosis..  Thanks for the kind referral.  Follow-up with me in about 3 to 4 months.  Pixie Casino, MD, [facility], [place] Director of the Advanced Lipid Disorders &  Cardiovascular Risk Reduction Clinic Diplomate of the American Board of Clinical Lipidology Attending Cardiologist  Direct Dial: [phone]  Fax: [phone]  Website:  [email or web address] [email or web address] Otterson 01/26/2021, 10:10 AM

## 2021-01-26 NOTE — Patient Instructions (Signed)
Medication Instructions:  Hold Crestor for 2 weeks and contact our office with an update on symptoms.   *If you need a refill on your cardiac medications before your next appointment, please call your pharmacy*   Lab Work: NMR, LP(a), ApoB  If you have labs (blood work) drawn today and your tests are completely normal, you will receive your results only by: Marland Kitchen MyChart Message (if you have MyChart) OR . A paper copy in the mail If you have any lab test that is abnormal or we need to change your treatment, we will call you to review the results.   Testing/Procedures: None ordered   Follow-Up: At [facility], you and your health needs are our priority.  As part of our continuing mission to provide you with exceptional heart care, we have created designated Provider Care Teams.  These Care Teams include your primary Cardiologist (physician) and Advanced Practice Providers (APPs -  Physician Assistants and Nurse Practitioners) who all work together to provide you with the care you need, when you need it.  We recommend signing up for the patient portal called "MyChart".  Sign up information is provided on this After Visit Summary.  MyChart is used to connect with patients for Virtual Visits (Telemedicine).  Patients are able to view lab/test results, encounter notes, upcoming appointments, etc.  Non-urgent messages can be sent to your provider as well.   To learn more about what you can do with MyChart, go to [email or web address].    Your next appointment:   3 month(s)  The format for your next appointment:   In Person  Provider:   K. Mali Hilty, MD

## 2021-01-27 LAB — NMR, LIPOPROFILE
Cholesterol, Total: 217 mg/dL — ABNORMAL HIGH (ref 100–199)
HDL Cholesterol by NMR: 60 mg/dL (ref >39)
HDL Particle Number: 47 umol/L (ref >=30.5)
LDL Particle Number: 1671 nmol/L — ABNORMAL HIGH (ref <1000)
LDL Size: 20.9 nm (ref >20.5)
LDL-C (NIH Calc): 120 mg/dL — ABNORMAL HIGH (ref 0–99)
LP-IR Score: 65 — ABNORMAL HIGH (ref <=45)
Small LDL Particle Number: 582 nmol/L — ABNORMAL HIGH (ref <=527)
Triglycerides by NMR: 211 mg/dL — ABNORMAL HIGH (ref 0–149)

## 2021-01-27 LAB — LIPOPROTEIN A (LPA): Lipoprotein (a): 228.3 nmol/L — ABNORMAL HIGH (ref <75.0)

## 2021-02-04 MED FILL — Potassium Citrate Tab ER 10 MEQ (1080 MG): ORAL | 90 days supply | Qty: 180 | Fill #0 | Status: CN

## 2021-02-05 ENCOUNTER — Other Ambulatory Visit: Payer: Self-pay

## 2021-02-06 ENCOUNTER — Other Ambulatory Visit: Payer: Self-pay

## 2021-02-07 ENCOUNTER — Other Ambulatory Visit: Payer: Self-pay

## 2021-02-07 ENCOUNTER — Other Ambulatory Visit (HOSPITAL_COMMUNITY): Payer: Self-pay

## 2021-02-07 MED FILL — Potassium Citrate Tab ER 10 MEQ (1080 MG): ORAL | 90 days supply | Qty: 180 | Fill #0 | Status: AC

## 2021-02-12 ENCOUNTER — Other Ambulatory Visit: Payer: Self-pay

## 2021-02-12 MED ORDER — CONTRAVE 8-90 MG PO TB12
ORAL_TABLET | ORAL | 0 refills | Status: DC
  Filled 2021-02-12: qty 120, 30d supply, fill #0

## 2021-02-13 ENCOUNTER — Other Ambulatory Visit: Payer: Self-pay

## 2021-02-14 ENCOUNTER — Other Ambulatory Visit: Payer: Self-pay

## 2021-02-14 MED FILL — Norethindrone Ace & Ethinyl Estradiol Tab 1.5 MG-30 MCG: ORAL | 84 days supply | Qty: 84 | Fill #0 | Status: AC

## 2021-02-14 MED FILL — Valacyclovir HCl Tab 1 GM: ORAL | 90 days supply | Qty: 90 | Fill #0 | Status: AC

## 2021-02-14 MED FILL — Montelukast Sodium Tab 10 MG (Base Equiv): ORAL | 90 days supply | Qty: 90 | Fill #0 | Status: AC

## 2021-02-15 ENCOUNTER — Other Ambulatory Visit: Payer: Self-pay

## 2021-02-16 DIAGNOSIS — T380X5A Adverse effect of glucocorticoids and synthetic analogues, initial encounter: Secondary | ICD-10-CM | POA: Diagnosis not present

## 2021-02-16 DIAGNOSIS — H4061X0 Glaucoma secondary to drugs, right eye, stage unspecified: Secondary | ICD-10-CM | POA: Diagnosis not present

## 2021-02-19 ENCOUNTER — Other Ambulatory Visit: Payer: Self-pay

## 2021-02-22 DIAGNOSIS — H0102B Squamous blepharitis left eye, upper and lower eyelids: Secondary | ICD-10-CM | POA: Diagnosis not present

## 2021-02-22 DIAGNOSIS — H17821 Peripheral opacity of cornea, right eye: Secondary | ICD-10-CM | POA: Diagnosis not present

## 2021-02-22 DIAGNOSIS — H0102A Squamous blepharitis right eye, upper and lower eyelids: Secondary | ICD-10-CM | POA: Diagnosis not present

## 2021-02-22 DIAGNOSIS — H04123 Dry eye syndrome of bilateral lacrimal glands: Secondary | ICD-10-CM | POA: Diagnosis not present

## 2021-02-22 DIAGNOSIS — H16301 Unspecified interstitial keratitis, right eye: Secondary | ICD-10-CM | POA: Diagnosis not present

## 2021-02-22 DIAGNOSIS — H401112 Primary open-angle glaucoma, right eye, moderate stage: Secondary | ICD-10-CM | POA: Diagnosis not present

## 2021-02-26 ENCOUNTER — Encounter (HOSPITAL_BASED_OUTPATIENT_CLINIC_OR_DEPARTMENT_OTHER): Payer: Self-pay

## 2021-02-27 ENCOUNTER — Encounter: Payer: Self-pay | Admitting: *Deleted

## 2021-02-27 ENCOUNTER — Other Ambulatory Visit: Payer: Self-pay

## 2021-03-01 ENCOUNTER — Other Ambulatory Visit: Payer: Self-pay

## 2021-03-01 MED ORDER — REPATHA SURECLICK 140 MG/ML ~~LOC~~ SOAJ
1.0000 | SUBCUTANEOUS | 11 refills | Status: DC
  Filled 2021-03-01: qty 2, 28d supply, fill #0
  Filled 2021-03-01: qty 2, 30d supply, fill #0
  Filled 2021-03-06 – 2021-03-07 (×2): qty 2, 28d supply, fill #0
  Filled 2021-03-20 – 2021-03-29 (×2): qty 2, 28d supply, fill #1
  Filled 2021-04-24: qty 2, 28d supply, fill #2
  Filled 2021-05-18: qty 2, 28d supply, fill #3
  Filled 2021-06-07: qty 2, 28d supply, fill #4
  Filled 2021-07-02: qty 2, 28d supply, fill #5
  Filled 2021-08-09 (×2): qty 6, 84d supply, fill #6
  Filled 2021-12-21: qty 6, 84d supply, fill #7

## 2021-03-02 ENCOUNTER — Other Ambulatory Visit (HOSPITAL_COMMUNITY): Payer: Self-pay | Admitting: Cardiology

## 2021-03-02 ENCOUNTER — Other Ambulatory Visit: Payer: Self-pay | Admitting: Cardiology

## 2021-03-02 DIAGNOSIS — Z136 Encounter for screening for cardiovascular disorders: Secondary | ICD-10-CM

## 2021-03-02 DIAGNOSIS — Z9189 Other specified personal risk factors, not elsewhere classified: Secondary | ICD-10-CM

## 2021-03-05 ENCOUNTER — Other Ambulatory Visit: Payer: Self-pay

## 2021-03-05 ENCOUNTER — Telehealth: Payer: Self-pay | Admitting: Internal Medicine

## 2021-03-05 DIAGNOSIS — H4061X0 Glaucoma secondary to drugs, right eye, stage unspecified: Secondary | ICD-10-CM | POA: Diagnosis not present

## 2021-03-05 DIAGNOSIS — T380X5A Adverse effect of glucocorticoids and synthetic analogues, initial encounter: Secondary | ICD-10-CM | POA: Diagnosis not present

## 2021-03-05 MED ORDER — POTASSIUM CITRATE ER 10 MEQ (1080 MG) PO TBCR
EXTENDED_RELEASE_TABLET | ORAL | 1 refills | Status: DC
  Filled 2021-03-05: qty 180, 90d supply, fill #0
  Filled 2021-03-07: qty 88, 44d supply, fill #0
  Filled 2021-06-19: qty 180, 90d supply, fill #1
  Filled 2021-08-09: qty 92, 46d supply, fill #2

## 2021-03-05 NOTE — Telephone Encounter (Signed)
PA for Repatha Sureclick submitted via [place] (Key: [id number])

## 2021-03-06 ENCOUNTER — Other Ambulatory Visit: Payer: Self-pay

## 2021-03-06 ENCOUNTER — Ambulatory Visit
Admission: RE | Admit: 2021-03-06 | Discharge: 2021-03-06 | Disposition: A | Discharge status: Home / Self Care | Payer: 59 | Source: Ambulatory Visit | Attending: Cardiology | Admitting: Cardiology

## 2021-03-06 DIAGNOSIS — Z136 Encounter for screening for cardiovascular disorders: Secondary | ICD-10-CM

## 2021-03-07 ENCOUNTER — Other Ambulatory Visit: Payer: Self-pay

## 2021-03-07 NOTE — Telephone Encounter (Signed)
Patient approved for repatha sureclick from 4/98/26 - [birth year]

## 2021-03-20 ENCOUNTER — Other Ambulatory Visit: Payer: Self-pay

## 2021-03-20 MED FILL — Azathioprine Tab 50 MG: ORAL | 90 days supply | Qty: 270 | Fill #0 | Status: CN

## 2021-03-21 ENCOUNTER — Other Ambulatory Visit: Payer: Self-pay

## 2021-03-21 MED FILL — Azathioprine Tab 50 MG: ORAL | 90 days supply | Qty: 270 | Fill #0 | Status: AC

## 2021-03-22 ENCOUNTER — Other Ambulatory Visit: Payer: Self-pay

## 2021-03-23 ENCOUNTER — Other Ambulatory Visit: Payer: Self-pay

## 2021-03-29 ENCOUNTER — Other Ambulatory Visit: Payer: Self-pay

## 2021-04-03 ENCOUNTER — Other Ambulatory Visit: Payer: Self-pay

## 2021-04-03 ENCOUNTER — Ambulatory Visit (INDEPENDENT_AMBULATORY_CARE_PROVIDER_SITE_OTHER): Payer: 59 | Admitting: *Deleted

## 2021-04-03 DIAGNOSIS — T63441D Toxic effect of venom of bees, accidental (unintentional), subsequent encounter: Secondary | ICD-10-CM | POA: Diagnosis not present

## 2021-04-05 DIAGNOSIS — I83813 Varicose veins of bilateral lower extremities with pain: Secondary | ICD-10-CM | POA: Diagnosis not present

## 2021-04-06 ENCOUNTER — Other Ambulatory Visit: Payer: Self-pay

## 2021-04-12 DIAGNOSIS — N200 Calculus of kidney: Secondary | ICD-10-CM | POA: Diagnosis not present

## 2021-04-12 DIAGNOSIS — R82992 Hyperoxaluria: Secondary | ICD-10-CM | POA: Diagnosis not present

## 2021-04-12 DIAGNOSIS — N3010 Interstitial cystitis (chronic) without hematuria: Secondary | ICD-10-CM | POA: Diagnosis not present

## 2021-04-24 ENCOUNTER — Other Ambulatory Visit: Payer: Self-pay

## 2021-04-24 ENCOUNTER — Ambulatory Visit (HOSPITAL_BASED_OUTPATIENT_CLINIC_OR_DEPARTMENT_OTHER): Payer: 59 | Admitting: Internal Medicine

## 2021-04-25 ENCOUNTER — Other Ambulatory Visit: Payer: Self-pay

## 2021-05-02 ENCOUNTER — Other Ambulatory Visit
Admission: RE | Admit: 2021-05-02 | Discharge: 2021-05-02 | Disposition: A | Discharge status: Home / Self Care | Payer: 59 | Source: Ambulatory Visit | Attending: Ophthalmology | Admitting: Ophthalmology

## 2021-05-02 DIAGNOSIS — H209 Unspecified iridocyclitis: Secondary | ICD-10-CM | POA: Insufficient documentation

## 2021-05-02 LAB — COMPREHENSIVE METABOLIC PANEL
ALT: 19 U/L (ref 0–44)
AST: 22 U/L (ref 15–41)
Albumin: 4.2 g/dL (ref 3.5–5.0)
Alkaline Phosphatase: 31 U/L — ABNORMAL LOW (ref 38–126)
Anion gap: 11 (ref 5–15)
BUN: 17 mg/dL (ref 6–20)
CO2: 21 mmol/L — ABNORMAL LOW (ref 22–32)
Calcium: 9.4 mg/dL (ref 8.9–10.3)
Chloride: 103 mmol/L (ref 98–111)
Creatinine, Ser: 0.77 mg/dL (ref 0.44–1.00)
GFR, Estimated: >60 mL/min (ref >60)
Glucose, Bld: 100 mg/dL — ABNORMAL HIGH (ref 70–99)
Potassium: 4 mmol/L (ref 3.5–5.1)
Sodium: 135 mmol/L (ref 135–145)
Total Bilirubin: 0.9 mg/dL (ref 0.3–1.2)
Total Protein: 7.6 g/dL (ref 6.5–8.1)

## 2021-05-02 LAB — CBC WITH DIFFERENTIAL/PLATELET
Abs Immature Granulocytes: 0.04 10*3/uL (ref 0.00–0.07)
Basophils Absolute: 0 10*3/uL (ref 0.0–0.1)
Basophils Relative: 1 %
Eosinophils Absolute: 0.1 10*3/uL (ref 0.0–0.5)
Eosinophils Relative: 1 %
HCT: 40.2 % (ref 36.0–46.0)
Hemoglobin: 14.7 g/dL (ref 12.0–15.0)
Immature Granulocytes: 1 %
Lymphocytes Relative: 18 %
Lymphs Abs: 1.5 10*3/uL (ref 0.7–4.0)
MCH: 39.2 pg — ABNORMAL HIGH (ref 26.0–34.0)
MCHC: 36.6 g/dL — ABNORMAL HIGH (ref 30.0–36.0)
MCV: 107.2 fL — ABNORMAL HIGH (ref 80.0–100.0)
Monocytes Absolute: 0.5 10*3/uL (ref 0.1–1.0)
Monocytes Relative: 6 %
Neutro Abs: 5.9 10*3/uL (ref 1.7–7.7)
Neutrophils Relative %: 73 %
Platelets: 399 10*3/uL (ref 150–400)
RBC: 3.75 MIL/uL — ABNORMAL LOW (ref 3.87–5.11)
RDW: 11.9 % (ref 11.5–15.5)
WBC: 8 10*3/uL (ref 4.0–10.5)
nRBC: 0 % (ref 0.0–0.2)

## 2021-05-15 ENCOUNTER — Other Ambulatory Visit: Payer: Self-pay

## 2021-05-15 ENCOUNTER — Other Ambulatory Visit (HOSPITAL_COMMUNITY): Payer: Self-pay

## 2021-05-15 DIAGNOSIS — B0052 Herpesviral keratitis: Secondary | ICD-10-CM | POA: Diagnosis not present

## 2021-05-15 DIAGNOSIS — H209 Unspecified iridocyclitis: Secondary | ICD-10-CM | POA: Diagnosis not present

## 2021-05-15 DIAGNOSIS — T380X5A Adverse effect of glucocorticoids and synthetic analogues, initial encounter: Secondary | ICD-10-CM | POA: Diagnosis not present

## 2021-05-15 DIAGNOSIS — H4061X0 Glaucoma secondary to drugs, right eye, stage unspecified: Secondary | ICD-10-CM | POA: Diagnosis not present

## 2021-05-15 MED ORDER — LOTEMAX SM 0.38 % OP GEL
1.0000 [drp] | Freq: Two times a day (BID) | OPHTHALMIC | 11 refills | Status: DC
  Filled 2021-05-15: qty 5, 25d supply, fill #0
  Filled 2021-05-15: qty 5, 50d supply, fill #0
  Filled 2021-08-09: qty 5, 25d supply, fill #1

## 2021-05-15 MED ORDER — AZATHIOPRINE 50 MG PO TABS
50.0000 mg | ORAL_TABLET | Freq: Three times a day (TID) | ORAL | 2 refills | Status: DC
  Filled 2021-05-15: qty 270, 90d supply, fill #0

## 2021-05-15 MED ORDER — VALACYCLOVIR HCL 1 G PO TABS
1000.0000 mg | ORAL_TABLET | Freq: Every day | ORAL | 11 refills | Status: DC
  Filled 2021-05-15 (×2): qty 90, 90d supply, fill #0
  Filled 2021-08-09: qty 90, 90d supply, fill #1

## 2021-05-15 MED FILL — Timolol Maleate Ophth Soln 0.5%: OPHTHALMIC | 25 days supply | Qty: 5 | Fill #0 | Status: AC

## 2021-05-16 ENCOUNTER — Other Ambulatory Visit: Payer: Self-pay

## 2021-05-18 ENCOUNTER — Other Ambulatory Visit: Payer: Self-pay

## 2021-05-18 MED FILL — Montelukast Sodium Tab 10 MG (Base Equiv): ORAL | 90 days supply | Qty: 90 | Fill #1 | Status: AC

## 2021-05-22 DIAGNOSIS — I83813 Varicose veins of bilateral lower extremities with pain: Secondary | ICD-10-CM | POA: Diagnosis not present

## 2021-05-24 ENCOUNTER — Other Ambulatory Visit: Payer: Self-pay

## 2021-05-24 MED FILL — Norethindrone Ace & Ethinyl Estradiol Tab 1.5 MG-30 MCG: ORAL | 84 days supply | Qty: 84 | Fill #1 | Status: AC

## 2021-05-25 ENCOUNTER — Other Ambulatory Visit: Payer: Self-pay

## 2021-05-28 ENCOUNTER — Other Ambulatory Visit: Payer: Self-pay | Admitting: Allergy

## 2021-05-28 ENCOUNTER — Other Ambulatory Visit: Payer: Self-pay

## 2021-05-28 NOTE — Telephone Encounter (Signed)
Please advise patient has an appointment 06/26/21 and her last office visit was September of 2021. I was going to send in 2 epi pen with no refills, but the note below stated the patient would like 4 because she is going out of town.

## 2021-05-28 NOTE — Telephone Encounter (Signed)
Patient is requesting prescription be written as 90 days in order to get 4 pins. Patient is going out of town to where they are an hour away from town.  Please call patient with questions and/or script is called in.

## 2021-05-29 ENCOUNTER — Other Ambulatory Visit: Payer: Self-pay

## 2021-05-29 DIAGNOSIS — I8311 Varicose veins of right lower extremity with inflammation: Secondary | ICD-10-CM | POA: Diagnosis not present

## 2021-05-29 DIAGNOSIS — I83811 Varicose veins of right lower extremities with pain: Secondary | ICD-10-CM | POA: Diagnosis not present

## 2021-05-29 MED ORDER — EPINEPHRINE 0.3 MG/0.3ML IJ SOAJ
INTRAMUSCULAR | 0 refills | Status: DC
  Filled 2021-05-29: qty 4, 60d supply, fill #0

## 2021-05-30 ENCOUNTER — Ambulatory Visit: Payer: 59 | Admitting: Internal Medicine

## 2021-06-04 DIAGNOSIS — I8312 Varicose veins of left lower extremity with inflammation: Secondary | ICD-10-CM | POA: Diagnosis not present

## 2021-06-04 DIAGNOSIS — I87322 Chronic venous hypertension (idiopathic) with inflammation of left lower extremity: Secondary | ICD-10-CM | POA: Diagnosis not present

## 2021-06-04 DIAGNOSIS — I83812 Varicose veins of left lower extremities with pain: Secondary | ICD-10-CM | POA: Diagnosis not present

## 2021-06-06 ENCOUNTER — Other Ambulatory Visit: Payer: Self-pay

## 2021-06-07 ENCOUNTER — Other Ambulatory Visit: Payer: Self-pay

## 2021-06-07 MED FILL — Azathioprine Tab 50 MG: ORAL | 90 days supply | Qty: 270 | Fill #1 | Status: CN

## 2021-06-08 DIAGNOSIS — I8312 Varicose veins of left lower extremity with inflammation: Secondary | ICD-10-CM | POA: Diagnosis not present

## 2021-06-11 ENCOUNTER — Other Ambulatory Visit: Payer: Self-pay

## 2021-06-11 MED FILL — Azathioprine Tab 50 MG: ORAL | 90 days supply | Qty: 270 | Fill #1 | Status: AC

## 2021-06-18 DIAGNOSIS — I8311 Varicose veins of right lower extremity with inflammation: Secondary | ICD-10-CM | POA: Diagnosis not present

## 2021-06-18 DIAGNOSIS — I83811 Varicose veins of right lower extremities with pain: Secondary | ICD-10-CM | POA: Diagnosis not present

## 2021-06-19 ENCOUNTER — Other Ambulatory Visit: Payer: Self-pay

## 2021-06-20 ENCOUNTER — Other Ambulatory Visit: Payer: Self-pay

## 2021-06-26 ENCOUNTER — Ambulatory Visit: Payer: 59

## 2021-06-26 DIAGNOSIS — I8312 Varicose veins of left lower extremity with inflammation: Secondary | ICD-10-CM | POA: Diagnosis not present

## 2021-06-26 DIAGNOSIS — I83812 Varicose veins of left lower extremities with pain: Secondary | ICD-10-CM | POA: Diagnosis not present

## 2021-06-27 ENCOUNTER — Telehealth: Payer: Self-pay | Admitting: Internal Medicine

## 2021-06-27 ENCOUNTER — Other Ambulatory Visit
Admission: RE | Admit: 2021-06-27 | Discharge: 2021-06-27 | Disposition: A | Discharge status: Home / Self Care | Payer: 59 | Attending: Ophthalmology | Admitting: Ophthalmology

## 2021-06-27 ENCOUNTER — Other Ambulatory Visit
Admission: RE | Admit: 2021-06-27 | Discharge: 2021-06-27 | Disposition: A | Discharge status: Home / Self Care | Payer: 59 | Attending: Internal Medicine | Admitting: Internal Medicine

## 2021-06-27 DIAGNOSIS — H209 Unspecified iridocyclitis: Secondary | ICD-10-CM | POA: Diagnosis not present

## 2021-06-27 LAB — CBC WITH DIFFERENTIAL/PLATELET
Abs Immature Granulocytes: 0.03 10*3/uL (ref 0.00–0.07)
Basophils Absolute: 0 10*3/uL (ref 0.0–0.1)
Basophils Relative: 1 %
Eosinophils Absolute: 0.2 10*3/uL (ref 0.0–0.5)
Eosinophils Relative: 2 %
HCT: 37.6 % (ref 36.0–46.0)
Hemoglobin: 13.8 g/dL (ref 12.0–15.0)
Immature Granulocytes: 0 %
Lymphocytes Relative: 25 %
Lymphs Abs: 1.8 10*3/uL (ref 0.7–4.0)
MCH: 39.1 pg — ABNORMAL HIGH (ref 26.0–34.0)
MCHC: 36.7 g/dL — ABNORMAL HIGH (ref 30.0–36.0)
MCV: 106.5 fL — ABNORMAL HIGH (ref 80.0–100.0)
Monocytes Absolute: 0.5 10*3/uL (ref 0.1–1.0)
Monocytes Relative: 7 %
Neutro Abs: 4.7 10*3/uL (ref 1.7–7.7)
Neutrophils Relative %: 65 %
Platelets: 320 10*3/uL (ref 150–400)
RBC: 3.53 MIL/uL — ABNORMAL LOW (ref 3.87–5.11)
RDW: 12.6 % (ref 11.5–15.5)
WBC: 7.3 10*3/uL (ref 4.0–10.5)
nRBC: 0 % (ref 0.0–0.2)

## 2021-06-27 LAB — COMPREHENSIVE METABOLIC PANEL
ALT: 17 U/L (ref 0–44)
AST: 22 U/L (ref 15–41)
Albumin: 4 g/dL (ref 3.5–5.0)
Alkaline Phosphatase: 29 U/L — ABNORMAL LOW (ref 38–126)
Anion gap: 8 (ref 5–15)
BUN: 17 mg/dL (ref 6–20)
CO2: 21 mmol/L — ABNORMAL LOW (ref 22–32)
Calcium: 8.9 mg/dL (ref 8.9–10.3)
Chloride: 109 mmol/L (ref 98–111)
Creatinine, Ser: 0.71 mg/dL (ref 0.44–1.00)
GFR, Estimated: >60 mL/min (ref >60)
Glucose, Bld: 100 mg/dL — ABNORMAL HIGH (ref 70–99)
Potassium: 4.2 mmol/L (ref 3.5–5.1)
Sodium: 138 mmol/L (ref 135–145)
Total Bilirubin: 0.8 mg/dL (ref 0.3–1.2)
Total Protein: 7.3 g/dL (ref 6.5–8.1)

## 2021-06-27 NOTE — Telephone Encounter (Signed)
Pt is currently at the lab at [facility], she states she was under the impression that she was supposed to have  LIPID labs today not just CBC. Please advise pt further

## 2021-06-27 NOTE — Telephone Encounter (Signed)
Spoke with patient who is at [facility] lab work fasting blood work. She said the lab tech does not see a lipid panel. Explained that a lipid panel was not ordered - it was NMR lipoprofile, LP(a) and apolipoprotein B. Also spoke with lab tech and explained this and she found the lab orders. No further assistance needed.

## 2021-06-28 ENCOUNTER — Other Ambulatory Visit: Payer: Self-pay

## 2021-06-28 LAB — NMR, LIPOPROFILE
Cholesterol, Total: 142 mg/dL (ref 100–199)
HDL Cholesterol by NMR: 56 mg/dL (ref >39)
HDL Particle Number: 48 umol/L (ref >=30.5)
LDL Particle Number: 960 nmol/L (ref <1000)
LDL Size: 21.7 nm (ref >20.5)
LDL-C (NIH Calc): 63 mg/dL (ref 0–99)
LP-IR Score: 51 — ABNORMAL HIGH (ref <=45)
Small LDL Particle Number: 436 nmol/L (ref <=527)
Triglycerides by NMR: 135 mg/dL (ref 0–149)

## 2021-06-28 LAB — LIPOPROTEIN A (LPA): Lipoprotein (a): 243.3 nmol/L — ABNORMAL HIGH (ref <75.0)

## 2021-06-28 LAB — MISC LABCORP TEST (SEND OUT): Labcorp test code: 167015

## 2021-06-29 ENCOUNTER — Encounter: Payer: Self-pay | Admitting: Internal Medicine

## 2021-06-29 ENCOUNTER — Telehealth (INDEPENDENT_AMBULATORY_CARE_PROVIDER_SITE_OTHER): Payer: 59 | Admitting: Internal Medicine

## 2021-06-29 VITALS — BP 128/88 | HR 70

## 2021-06-29 DIAGNOSIS — E785 Hyperlipidemia, unspecified: Secondary | ICD-10-CM | POA: Diagnosis not present

## 2021-06-29 DIAGNOSIS — Z789 Other specified health status: Secondary | ICD-10-CM | POA: Diagnosis not present

## 2021-06-29 NOTE — Patient Instructions (Signed)
Medication Instructions:  Your physician recommends that you continue on your current medications as directed. Please refer to the Current Medication list given to you today.   *If you need a refill on your cardiac medications before your next appointment, please call your pharmacy*   Lab Work: FASTING lab work to check cholesterol in 1 year -- NMR lipoprofile  If you have labs (blood work) drawn today and your tests are completely normal, you will receive your results only by: [place] (if you have MyChart) OR A paper copy in the mail If you have any lab test that is abnormal or we need to change your treatment, we will call you to review the results.   Testing/Procedures: NONE   Follow-Up: At [facility], you and your health needs are our priority.  As part of our continuing mission to provide you with exceptional heart care, we have created designated Provider Care Teams.  These Care Teams include your primary Cardiologist (physician) and Advanced Practice Providers (APPs -  Physician Assistants and Nurse Practitioners) who all work together to provide you with the care you need, when you need it.  We recommend signing up for the patient portal called "MyChart".  Sign up information is provided on this After Visit Summary.  MyChart is used to connect with patients for Virtual Visits (Telemedicine).  Patients are able to view lab/test results, encounter notes, upcoming appointments, etc.  Non-urgent messages can be sent to your provider as well.   To learn more about what you can do with MyChart, go to [email or web address].    Your next appointment:   12 month(s)  The format for your next appointment:   In Person  Provider:   K. Mali Hilty, MD   Other Instructions

## 2021-06-29 NOTE — Progress Notes (Signed)
Virtual Visit via Video Note   This visit type was conducted due to national recommendations for restrictions regarding the COVID-19 Pandemic (e.g. social distancing) in an effort to limit this patient's exposure and mitigate transmission in our community.  Due to her co-morbid illnesses, this patient is at least at moderate risk for complications without adequate follow up.  This format is felt to be most appropriate for this patient at this time.  All issues noted in this document were discussed and addressed.  A limited physical exam was performed with this format.  Please refer to the patient's chart for her consent to telehealth for [facility].      Date:  06/29/2021   ID:  Rachel Hooper, DOB 08-12-1981, MRN [id number] The patient was identified using 2 identifiers.  Evaluation Performed:  Follow-Up Visit  Patient Location:  [place] [address]  Provider location:   [address], [place], [place] [address]  PCP:  Carollee Herter, Alferd Apa, DO  Cardiologist:  None Electrophysiologist:  None   Chief Complaint:  Follow-up dyslipidemia  History of Present Illness:    Rachel Hooper is a 40 y.o. female who presents via audio/video conferencing for a telehealth visit today.  [name] returns today for follow-up.  She has responded quite nicely to [place] with marked reduction in her lipids.  All of her lipid NMR parameters are within normal limits including an LDL-P of 960, LDL-C of 63, HDL 56 and triglycerides 135.  All LDL P is improved to 436 nmol/L.  The only value that did not significantly change was her LP(a) which remains elevated at 243 nmol/L.  Overall she continues to tolerate the medicine well without any significant side effects.  The patient does not have symptoms concerning for COVID-19 infection (fever, chills, cough, or new SHORTNESS OF BREATH).    Prior CV studies:   The following studies were reviewed today:  Chart  reviewed, lab work  PMHx:  Past Medical History:  Diagnosis Date   Asthma    Endometriosis    GERD (gastroesophageal reflux disease)    History of kidney stones    Hydrosalpinx    left   Hyperlipidemia    diet controlled, no med   Hypertension    PAI-1 4G/4G genotype    PCOS (polycystic ovarian syndrome)    PONV (postoperative nausea and vomiting)    severe   Seasonal allergies     Past Surgical History:  Procedure Laterality Date   ANTERIOR CRUCIATE LIGAMENT REPAIR Right 1995   CHROMOPERTUBATION N/A 05/03/2015   Procedure: CHROMOPERTUBATION;  Surgeon: Governor Specking, MD;  Location: [place];  Service: Gynecology;  Laterality: N/A;   EXTRACORPOREAL SHOCK WAVE LITHOTRIPSY  2008   LAPAROSCOPY N/A 03/28/2015   Procedure: LAPAROSCOPY DIAGNOSTIC;  Surgeon: Brien Few, MD;  Location: [facility];  Service: Gynecology;  Laterality: N/A;   LAPAROSCOPY N/A 05/03/2015   Procedure: LAPAROSCOPY, EXCISION ENDOMETRIOSIS, ENTERO LYSIS, WITH ENDOMETRIAL BIOPSY;  Surgeon: Governor Specking, MD;  Location: [place];  Service: Gynecology;  Laterality: N/A;   NASAL SEPTOPLASTY W/ TURBINOPLASTY Bilateral 06/20/2017   Procedure: NASAL SEPTOPLASTY WITH BILATERAL TURBINATE REDUCTION;  Surgeon: Melida Quitter, MD;  Location: [place];  Service: ENT;  Laterality: Bilateral;   UNILATERAL SALPINGECTOMY Left 05/03/2015   Procedure: LEFT SALPINGECTOMY;  Surgeon: Governor Specking, MD;  Location: [place];  Service: Gynecology;  Laterality: Left;   WISDOM TOOTH EXTRACTION  FAMHx:  Family History  Problem Relation Age of Onset   Allergic rhinitis Mother    Asthma Mother    Eczema Mother    Eczema Maternal Grandfather    Angioedema Neg Hx    Immunodeficiency Neg Hx    Urticaria Neg Hx     SOCHx:   reports that she has never smoked. She has never used smokeless tobacco. She reports that she does not drink alcohol and does not use drugs.  ALLERGIES:   Allergies  Allergen Reactions   Bee Venom Anaphylaxis    And wasp venom   Pneumococcal Vaccines Anaphylaxis   Atorvastatin     Fatigue, Brain Fog, Muscle Pain   Mixed Vespid Venom    Rosuvastatin     Fatigue, Brain Fog, Muscle Pain   Wasp Venom     MEDS:  Current Meds  Medication Sig   aspirin 81 MG tablet Take 81 mg by mouth daily.   azaTHIOprine (IMURAN) 50 MG tablet Take 1 tablet (50 mg total) by mouth 3 (three) times daily.   budesonide (PULMICORT FLEXHALER) 180 MCG/ACT inhaler INHALE TWO PUFFS BY MOUTH INTO THE LUNGS TWICE A DAY TO PREVENT COUGH OR WHEEZE. RINSE, GARGLE AND SPIT AFTER USE.   budesonide (RHINOCORT ALLERGY) 32 MCG/ACT nasal spray ONE SPRAY EACH NOSTRIL TWICE A DAY FOR NASAL CONGESTION OR DRAINAGE.   cetirizine (ZYRTEC) 10 MG tablet Take 1 tablet (10 mg total) by mouth at bedtime.   CONTRAVE 8-90 MG TB12    COVID-19 mRNA Vac-TriS, Pfizer, SUSP injection USE AS DIRECTED   EPINEPHrine 0.3 mg/0.3 mL IJ SOAJ injection INJECT 0.3 MLS (0.3 MG TOTAL) INTO THE MUSCLE ONCE FOR 1 DOSE. USE AS DIRECTED FOR SEVERE ALLERGIC REACTION.   Evolocumab (REPATHA SURECLICK) XX123456 MG/ML SOAJ Inject 1 Dose into the skin every 14 (fourteen) days.   LARIN 1.5/30 1.5-30 MG-MCG tablet    Loteprednol Etabonate (LOTEMAX SM) 0.38 % GEL Place 1 drop into the right eye in the morning and at bedtime.   magnesium oxide-pyridoxine (BEELITH) 362-20 MG TABS Take 1 tablet by mouth every evening.    metoprolol succinate (TOPROL-XL) 25 MG 24 hr tablet Take 12.5 mg by mouth daily.   montelukast (SINGULAIR) 10 MG tablet TAKE 1 TABLET (10 MG TOTAL) BY MOUTH AT BEDTIME.   Naltrexone-buPROPion HCl ER (CONTRAVE) 8-90 MG TB12 Take 2 tablets by mouth twice a day for 90 days.   Norethindrone Acetate-Ethinyl Estradiol (LOESTRIN) 1.5-30 MG-MCG tablet TAKE 1 TABLET BY MOUTH DAILY (CONTINUOUS ACTIVE PILLS)   potassium citrate (UROCIT-K) 10 MEQ (1080 MG) SR tablet TAKE 1 TABLET BY MOUTH 2 TIMES DAILY WITH MEALS.    timolol (TIMOPTIC) 0.5 % ophthalmic solution PLACE 1 DROP INTO THE RIGHT EYE DAILY.   valACYclovir (VALTREX) 1000 MG tablet Take 1 tablet (1,000 mg total) by mouth daily.   [DISCONTINUED] potassium citrate (UROCIT-K) 10 MEQ (1080 MG) SR tablet Take 20 mEq by mouth every evening. Reported on 03/04/2016   [DISCONTINUED] timolol (TIMOPTIC) 0.5 % ophthalmic solution      ROS: Pertinent items noted in HPI and remainder of comprehensive ROS otherwise negative.  Labs/Other Tests and Data Reviewed:    Recent Labs: 06/27/2021: ALT 17; BUN 17; Creatinine, Ser 0.71; Hemoglobin 13.8; Platelets 320; Potassium 4.2; Sodium 138   Recent Lipid Panel Lab Results  Component Value Date/Time   TRIG 135 06/27/2021 08:40 AM   HDL 56 06/27/2021 08:40 AM    Wt Readings from Last 3 Encounters:  01/26/21 171 lb (77.6  kg)  06/28/20 156 lb (70.8 kg)  05/06/19 168 lb (76.2 kg)     Exam:    Vital Signs:  BP 128/88   Pulse 70    General appearance: alert and no distress Lungs: No respiratory difficulty Abdomen: Overweight Extremities: extremities normal, atraumatic, no cyanosis or edema Skin: Skin color, texture, turgor normal. No rashes or lesions Neurologic: Grossly normal Psych: [place]  ASSESSMENT & PLAN:    Mixed dyslipidemia, goal LDL <100 Statin intolerance -fatigue, brain fog weakness Family history of early stroke  Overall Ms. Merida has had a marked response to [place].  Her lipids are significantly improved.  Her LP(a) however has not changed a lot which is unusual.  That being said she feels no side effects of the medication.  Any of her issues with the statin have resolved.  She is very pleased with how she feels and the improvement in her cholesterol.  We will continue with this therapy indefinitely and plan repeat lipids in about 1 year with follow-up at that time.  COVID-19 Education: The signs and symptoms of COVID-19 were discussed with the patient and how to seek care for testing  (follow up with PCP or arrange E-visit).  The importance of social distancing was discussed today.  Patient Risk:   After full review of this patients clinical status, I feel that they are at least moderate risk at this time.  Time:   Today, I have spent 5 minutes with the patient with telehealth technology discussing dyslipidemia.     Medication Adjustments/Labs and Tests Ordered: Current medicines are reviewed at length with the patient today.  Concerns regarding medicines are outlined above.   Tests Ordered: No orders of the defined types were placed in this encounter.   Medication Changes: No orders of the defined types were placed in this encounter.   Disposition:  in 1 year(s)  Pixie Casino, MD, [facility], [place] Director of the Advanced Lipid Disorders &  Cardiovascular Risk Reduction Clinic Diplomate of the American Board of Clinical Lipidology Attending Cardiologist  Direct Dial: [phone]  Fax: [phone]  Website:  [email or web address]  Pixie Casino, MD  06/29/2021 8:17 AM

## 2021-07-02 ENCOUNTER — Other Ambulatory Visit: Payer: Self-pay

## 2021-07-02 ENCOUNTER — Other Ambulatory Visit (HOSPITAL_COMMUNITY): Payer: Self-pay

## 2021-07-02 DIAGNOSIS — Z760 Encounter for issue of repeat prescription: Secondary | ICD-10-CM | POA: Diagnosis not present

## 2021-07-02 DIAGNOSIS — I83811 Varicose veins of right lower extremities with pain: Secondary | ICD-10-CM | POA: Diagnosis not present

## 2021-07-02 DIAGNOSIS — I8311 Varicose veins of right lower extremity with inflammation: Secondary | ICD-10-CM | POA: Diagnosis not present

## 2021-07-02 MED ORDER — CARESTART COVID-19 HOME TEST VI KIT
PACK | 0 refills | Status: DC
  Filled 2021-07-02: qty 4, 4d supply, fill #0

## 2021-07-03 ENCOUNTER — Ambulatory Visit (INDEPENDENT_AMBULATORY_CARE_PROVIDER_SITE_OTHER): Payer: 59 | Admitting: *Deleted

## 2021-07-03 ENCOUNTER — Other Ambulatory Visit: Payer: Self-pay

## 2021-07-03 DIAGNOSIS — T63441D Toxic effect of venom of bees, accidental (unintentional), subsequent encounter: Secondary | ICD-10-CM | POA: Diagnosis not present

## 2021-07-04 ENCOUNTER — Other Ambulatory Visit: Payer: Self-pay

## 2021-07-05 ENCOUNTER — Other Ambulatory Visit: Payer: Self-pay

## 2021-07-06 ENCOUNTER — Other Ambulatory Visit: Payer: Self-pay

## 2021-07-06 ENCOUNTER — Ambulatory Visit (INDEPENDENT_AMBULATORY_CARE_PROVIDER_SITE_OTHER): Payer: 59 | Admitting: Family Medicine

## 2021-07-06 ENCOUNTER — Encounter: Payer: Self-pay | Admitting: Family Medicine

## 2021-07-06 ENCOUNTER — Telehealth: Payer: Self-pay

## 2021-07-06 VITALS — BP 128/88 | HR 79 | Temp 98.9°F | Resp 18 | Ht 65.0 in | Wt 175.0 lb

## 2021-07-06 DIAGNOSIS — E785 Hyperlipidemia, unspecified: Secondary | ICD-10-CM | POA: Diagnosis not present

## 2021-07-06 DIAGNOSIS — H209 Unspecified iridocyclitis: Secondary | ICD-10-CM | POA: Diagnosis not present

## 2021-07-06 NOTE — Telephone Encounter (Signed)
Called, no answer. Left a message to call back to schedule evusheld injection.

## 2021-07-06 NOTE — Patient Instructions (Signed)

## 2021-07-06 NOTE — Assessment & Plan Note (Addendum)
Per opth Pt also needs evusheld

## 2021-07-06 NOTE — Progress Notes (Signed)
 Subjective:   By signing my name below, I, Rachel Hooper, attest that this documentation has been prepared under the direction and in the presence of  R Lowne Chase, DO. 07/06/2021      Patient ID: Rachel Hooper, female    DOB: 01/11/1981, 40 y.o.   MRN: [id number]  Chief Complaint  Patient presents with   Establish Care    Pt states no concerns. Pt wanting to establish care    HPI Patient is in today for re-establishment of care.  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 She reports that her vision is doing well at this time.  She has been experiencing night sweats and hot flashes.  She has been receiving Evusheld injections every 6 months at [facility].  She denies chest pain, shortness of breath, palpitations, lightheadedness, headaches, syncope, LE edema, orthopnea, PND.   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  She regularly exercises and has lost about 100 lbs over the past 2 years. She has gained about 35 pounds and is trying to bet back on a regular exercise regimen. She had some blood pressure issues.  Her parents are still alive but her maternal grandparents raised her. Her father had a stroke at 49. She is not in contact with her parents or her parental grandparents. Her maternal grandfather died of end-stage dementia and he had COPD.  She does not drink alcohol. She does not smoke tobacco or use drugs.   Past Medical History:  Diagnosis Date   Asthma    Endometriosis    GERD (gastroesophageal reflux disease)    History of kidney stones    Hydrosalpinx    left   Hyperlipidemia    diet controlled, no med   Hypertension    PAI-1 4G/4G genotype    PCOS (polycystic ovarian syndrome)    PONV (postoperative nausea and vomiting)    severe   Seasonal allergies     Past Surgical History:  Procedure Laterality Date   ANTERIOR CRUCIATE LIGAMENT  REPAIR Right 1995   CHROMOPERTUBATION N/A 05/03/2015   Procedure: CHROMOPERTUBATION;  Surgeon: Tamer Yalcinkaya, MD;  Location: [facility];  Service: Gynecology;  Laterality: N/A;   EXTRACORPOREAL SHOCK WAVE LITHOTRIPSY  2008   LAPAROSCOPY N/A 03/28/2015   Procedure: LAPAROSCOPY DIAGNOSTIC;  Surgeon: Richard Taavon, MD;  Location: [facility];  Service: Gynecology;  Laterality: N/A;   LAPAROSCOPY N/A 05/03/2015   Procedure: LAPAROSCOPY, EXCISION ENDOMETRIOSIS, ENTERO LYSIS, WITH ENDOMETRIAL BIOPSY;  Surgeon: Tamer Yalcinkaya, MD;  Location: [facility];  Service: Gynecology;  Laterality: N/A;   NASAL SEPTOPLASTY W/ TURBINOPLASTY Bilateral 06/20/2017   Procedure: NASAL SEPTOPLASTY WITH BILATERAL TURBINATE REDUCTION;  Surgeon: Bates, Dwight, MD;  Location: [facility];  Service: ENT;  Laterality: Bilateral;   UNILATERAL SALPINGECTOMY Left 05/03/2015   Procedure: LEFT SALPINGECTOMY;  Surgeon: Tamer Yalcinkaya, MD;  Location: [facility];  Service: Gynecology;  Laterality: Left;   WISDOM TOOTH EXTRACTION      Family History  Problem Relation Age of Onset   Allergic rhinitis Mother    Asthma Mother    Eczema Mother    Stroke Father    COPD Maternal Grandmother    Eczema Maternal Grandfather    Dementia Maternal Grandfather    COPD Maternal Grandfather    Stroke Paternal Grandmother    Angioedema Neg Hx    Immunodeficiency Neg Hx      Urticaria Neg Hx     Social History   Socioeconomic History   Marital status: Married    Spouse name: Not on file   Number of children: 1   Years of education: Not on file   Highest education level: Not on file  Occupational History   Occupation: nurse practitioner    Employer:   Tobacco Use   Smoking status: Never   Smokeless tobacco: Never  Vaping Use   Vaping Use: Never used  Substance and Sexual Activity   Alcohol use: No   Drug use: No   Sexual activity: Yes    Partners: Male    Birth  control/protection: None  Other Topics Concern   Not on file  Social History Narrative   Exercise daily   Social Determinants of Health   Financial Resource Strain: Not on file  Food Insecurity: Not on file  Transportation Needs: Not on file  Physical Activity: Not on file  Stress: Not on file  Social Connections: Not on file  Intimate Partner Violence: Not on file    Outpatient Medications Prior to Visit  Medication Sig Dispense Refill   aspirin 81 MG tablet Take 81 mg by mouth daily.     azaTHIOprine (IMURAN) 50 MG tablet TAKE 1 TABLET BY MOUTH 3 TIMES DAILY 270 tablet 2   azaTHIOprine (IMURAN) 50 MG tablet TAKE 1 TABLET (50 MG TOTAL) BY MOUTH 3 TIMES DAILY. 270 tablet 2   azaTHIOprine (IMURAN) 50 MG tablet Take 1 tablet (50 mg total) by mouth 3 (three) times daily. 270 tablet 2   budesonide (PULMICORT FLEXHALER) 180 MCG/ACT inhaler INHALE TWO PUFFS BY MOUTH INTO THE LUNGS TWICE A DAY TO PREVENT COUGH OR WHEEZE. RINSE, GARGLE AND SPIT AFTER USE. 3 each 3   budesonide (RHINOCORT ALLERGY) 32 MCG/ACT nasal spray ONE SPRAY EACH NOSTRIL TWICE A DAY FOR NASAL CONGESTION OR DRAINAGE. 8.6 g 5   cetirizine (ZYRTEC) 10 MG tablet Take 1 tablet (10 mg total) by mouth at bedtime. 30 tablet 5   CONTRAVE 8-90 MG TB12      EPINEPHrine 0.3 mg/0.3 mL IJ SOAJ injection INJECT 0.3 MLS (0.3 MG TOTAL) INTO THE MUSCLE ONCE FOR 1 DOSE. USE AS DIRECTED FOR SEVERE ALLERGIC REACTION. 4 each 0   Evolocumab (REPATHA SURECLICK) 140 MG/ML SOAJ Inject 1 Dose into the skin every 14 (fourteen) days. 2 mL 11   LARIN 1.5/30 1.5-30 MG-MCG tablet      Loteprednol Etabonate (LOTEMAX SM) 0.38 % GEL Place 1 drop into the right eye in the morning and at bedtime. 5 g 11   Loteprednol Etabonate 0.38 % GEL PLACE 1 DROP INTO THE RIGHT EYE IN THE MORNING AND AT BEDTIME. 5 g 11   magnesium oxide-pyridoxine (BEELITH) 362-20 MG TABS Take 1 tablet by mouth every evening.      metoprolol succinate (TOPROL-XL) 25 MG 24 hr tablet Take  12.5 mg by mouth daily.     montelukast (SINGULAIR) 10 MG tablet TAKE 1 TABLET (10 MG TOTAL) BY MOUTH AT BEDTIME. 90 tablet 5   Naltrexone-buPROPion HCl ER (CONTRAVE) 8-90 MG TB12 Take 2 tablets by mouth twice a day for 90 days. 360 tablet 0   Norethindrone Acetate-Ethinyl Estradiol (LOESTRIN) 1.5-30 MG-MCG tablet TAKE 1 TABLET BY MOUTH DAILY (CONTINUOUS ACTIVE PILLS) 84 tablet 3   potassium citrate (UROCIT-K) 10 MEQ (1080 MG) SR tablet TAKE 1 TABLET BY MOUTH 2 TIMES DAILY WITH MEALS. 180 tablet 1   timolol (TIMOPTIC) 0.5 % ophthalmic   solution PLACE 1 DROP INTO THE RIGHT EYE DAILY. 5 mL 11   valACYclovir (VALTREX) 1000 MG tablet TAKE 1 TABLET (1,000 MG TOTAL) BY MOUTH DAILY. 90 tablet 11   valACYclovir (VALTREX) 1000 MG tablet Take 1 tablet (1,000 mg total) by mouth daily. 90 tablet 11   albuterol (VENTOLIN HFA) 108 (90 Base) MCG/ACT inhaler INHALE 2 PUFFS BY MOUTH EVERY 6 HOURS AS NEEDED FOR WHEEZING OR SHORTNESS OF BREATH. 18 g 1   COVID-19 At Home Antigen Test (CARESTART COVID-19 HOME TEST) KIT Use as directed (Patient not taking: Reported on 07/06/2021) 4 each 0   COVID-19 mRNA Vac-TriS, Pfizer, SUSP injection USE AS DIRECTED (Patient not taking: Reported on 07/06/2021) .3 mL 0   No facility-administered medications prior to visit.    Allergies  Allergen Reactions   Bee Venom Anaphylaxis    And wasp venom   Pneumococcal Vaccines Anaphylaxis   Atorvastatin     Fatigue, Brain Fog, Muscle Pain   Mixed Vespid Venom    Rosuvastatin     Fatigue, Brain Fog, Muscle Pain   Wasp Venom     Review of Systems  Constitutional:  Negative for fever.       (+) night sweats (+) hot flashes  HENT:  Negative for congestion, ear pain, hearing loss, sinus pain and sore throat.   Eyes:  Negative for pain.  Respiratory:  Negative for cough, shortness of breath and wheezing.   Cardiovascular:  Negative for chest pain and palpitations.  Gastrointestinal:  Negative for blood in stool, constipation,  diarrhea, nausea and vomiting.  Genitourinary:  Negative for dysuria, frequency and hematuria.  Neurological:  Negative for headaches.      Objective:    Physical Exam Constitutional:      General: She is not in acute distress.    Appearance: Normal appearance. She is not ill-appearing.  HENT:     Head: Normocephalic and atraumatic.     Right Ear: External ear normal.     Left Ear: External ear normal.  Eyes:     Extraocular Movements: Extraocular movements intact.     Pupils: Pupils are equal, round, and reactive to light.  Cardiovascular:     Rate and Rhythm: Normal rate and regular rhythm.     Pulses: Normal pulses.     Heart sounds: Normal heart sounds. No murmur heard.   No gallop.  Pulmonary:     Effort: Pulmonary effort is normal. No respiratory distress.     Breath sounds: Normal breath sounds. No wheezing, rhonchi or rales.  Abdominal:     General: Bowel sounds are normal. There is no distension.     Palpations: Abdomen is soft. There is no mass.     Tenderness: There is no abdominal tenderness. There is no guarding or rebound.     Hernia: No hernia is present.  Musculoskeletal:     Cervical back: Normal range of motion and neck supple.  Lymphadenopathy:     Cervical: No cervical adenopathy.  Skin:    General: Skin is warm and dry.  Neurological:     Mental Status: She is alert and oriented to person, place, and time.  Psychiatric:        Behavior: Behavior normal.    BP 128/88 (BP Location: Right Arm, Patient Position: Sitting, Cuff Size: Normal)   Pulse 79   Temp 98.9 F (37.2 C) (Oral)   Resp 18   Ht 5' 5" (1.651 m)   Wt 175 lb (79.4 kg)     SpO2 100%   BMI 29.12 kg/m  Wt Readings from Last 3 Encounters:  07/06/21 175 lb (79.4 kg)  01/26/21 171 lb (77.6 kg)  06/28/20 156 lb (70.8 kg)    Diabetic Foot Exam - Simple   No data filed       Lab Results  Component Value Date   WBC 7.3 06/27/2021   HGB 13.8 06/27/2021   HCT 37.6 06/27/2021    MCV 106.5 (H) 06/27/2021   PLT 320 06/27/2021   Lab Results  Component Value Date   NA 138 06/27/2021   K 4.2 06/27/2021   CO2 21 (L) 06/27/2021   GLUCOSE 100 (H) 06/27/2021   BUN 17 06/27/2021   CREATININE 0.71 06/27/2021   BILITOT 0.8 06/27/2021   ALKPHOS 29 (L) 06/27/2021   AST 22 06/27/2021   ALT 17 06/27/2021   PROT 7.3 06/27/2021   ALBUMIN 4.0 06/27/2021   CALCIUM 8.9 06/27/2021   ANIONGAP 8 06/27/2021   No results found for: [facility] Lab Results  Component Value Date   HDL 56 06/27/2021   No results found for: [facility] Lab Results  Component Value Date   TRIG 135 06/27/2021   No results found for: CHOLHDL No results found for: HGBA1C     Assessment & Plan:   Problem List Items Addressed This Visit       Unprioritized   Hyperlipidemia    con't repatha per cardiology      Iridocyclitis - Primary    Per opth Pt also needs evusheld        No orders of the defined types were placed in this encounter.   I,Rachel Hooper,acting as a Education administrator for [employer], DO.,have documented all relevant documentation on the behalf of Ann Held, DO,as directed by  Ann Held, DO while in the presence of [place], DO. , personally preformed the services described in this documentation.  All medical record entries made by the scribe were at my direction and in my presence.  I have reviewed the chart and discharge instructions (if applicable) and agree that the record reflects my personal performance and is accurate and complete. 07/06/2021

## 2021-07-06 NOTE — Assessment & Plan Note (Signed)
con't repatha per cardiology

## 2021-07-16 ENCOUNTER — Ambulatory Visit (INDEPENDENT_AMBULATORY_CARE_PROVIDER_SITE_OTHER): Payer: 59

## 2021-07-16 ENCOUNTER — Other Ambulatory Visit: Payer: Self-pay

## 2021-07-16 DIAGNOSIS — H16301 Unspecified interstitial keratitis, right eye: Secondary | ICD-10-CM | POA: Diagnosis not present

## 2021-07-16 DIAGNOSIS — Z8742 Personal history of other diseases of the female genital tract: Secondary | ICD-10-CM | POA: Diagnosis not present

## 2021-07-16 DIAGNOSIS — H0102A Squamous blepharitis right eye, upper and lower eyelids: Secondary | ICD-10-CM | POA: Diagnosis not present

## 2021-07-16 DIAGNOSIS — Z298 Encounter for other specified prophylactic measures: Secondary | ICD-10-CM | POA: Diagnosis not present

## 2021-07-16 DIAGNOSIS — H209 Unspecified iridocyclitis: Secondary | ICD-10-CM

## 2021-07-16 DIAGNOSIS — N809 Endometriosis, unspecified: Secondary | ICD-10-CM | POA: Diagnosis not present

## 2021-07-16 DIAGNOSIS — H0102B Squamous blepharitis left eye, upper and lower eyelids: Secondary | ICD-10-CM | POA: Diagnosis not present

## 2021-07-16 DIAGNOSIS — H401112 Primary open-angle glaucoma, right eye, moderate stage: Secondary | ICD-10-CM | POA: Diagnosis not present

## 2021-07-16 DIAGNOSIS — H1131 Conjunctival hemorrhage, right eye: Secondary | ICD-10-CM | POA: Diagnosis not present

## 2021-07-16 DIAGNOSIS — H17821 Peripheral opacity of cornea, right eye: Secondary | ICD-10-CM | POA: Diagnosis not present

## 2021-07-16 MED ORDER — METHYLPREDNISOLONE SODIUM SUCC 125 MG IJ SOLR: 125.0000 mg | Freq: Once | INTRAMUSCULAR | Status: DC | PRN

## 2021-07-16 MED ORDER — ALBUTEROL SULFATE HFA 108 (90 BASE) MCG/ACT IN AERS: 2.0000 | INHALATION_SPRAY | Freq: Once | RESPIRATORY_TRACT | Status: DC | PRN

## 2021-07-16 MED ORDER — DIPHENHYDRAMINE HCL 50 MG/ML IJ SOLN: 50.0000 mg | Freq: Once | INTRAMUSCULAR | Status: DC | PRN

## 2021-07-16 MED ORDER — CILGAVIMAB (PART OF EVUSHELD) INJECTION
300.0000 mg | Freq: Once | INTRAMUSCULAR | Status: AC
  Administered 2021-07-16: 300 mg via INTRAMUSCULAR
  Filled 2021-07-16: qty 3

## 2021-07-16 MED ORDER — EPINEPHRINE 0.3 MG/0.3ML IJ SOAJ: 0.3000 mg | Freq: Once | INTRAMUSCULAR | Status: DC | PRN

## 2021-07-16 MED ORDER — TIXAGEVIMAB (PART OF EVUSHELD) INJECTION
300.0000 mg | Freq: Once | INTRAMUSCULAR | Status: AC
  Administered 2021-07-16: 300 mg via INTRAMUSCULAR
  Filled 2021-07-16: qty 3

## 2021-07-16 NOTE — Progress Notes (Signed)
Diagnosis: Pre-COVID Exposure Prophylaxis  Provider:  Marshell Garfinkel, MD  Procedure: Injection  Evusheld, Dose: 300mg +300mg  , Site: intramuscular  Discharge: Condition: Good, Destination: Home . AVS provided to patient. 30 minutes observation completed .  Performed by:  Arnoldo Morale, RN

## 2021-07-18 ENCOUNTER — Telehealth: Payer: Self-pay | Admitting: *Deleted

## 2021-07-18 NOTE — Telephone Encounter (Signed)
Patient called and scheduled a new patient

## 2021-07-23 DIAGNOSIS — I83892 Varicose veins of left lower extremities with other complications: Secondary | ICD-10-CM | POA: Diagnosis not present

## 2021-07-23 DIAGNOSIS — I8312 Varicose veins of left lower extremity with inflammation: Secondary | ICD-10-CM | POA: Diagnosis not present

## 2021-07-30 ENCOUNTER — Inpatient Hospital Stay (HOSPITAL_BASED_OUTPATIENT_CLINIC_OR_DEPARTMENT_OTHER): Payer: 59 | Admitting: Gynecologic Oncology

## 2021-07-30 ENCOUNTER — Other Ambulatory Visit: Payer: Self-pay

## 2021-07-30 ENCOUNTER — Encounter: Payer: Self-pay | Admitting: Gynecologic Oncology

## 2021-07-30 ENCOUNTER — Inpatient Hospital Stay: Payer: 59 | Attending: Gynecologic Oncology | Admitting: Gynecologic Oncology

## 2021-07-30 ENCOUNTER — Other Ambulatory Visit: Payer: Self-pay | Admitting: Gynecologic Oncology

## 2021-07-30 VITALS — BP 140/86 | HR 81 | Temp 98.1°F | Resp 18 | Ht 65.0 in | Wt 178.0 lb

## 2021-07-30 DIAGNOSIS — N939 Abnormal uterine and vaginal bleeding, unspecified: Secondary | ICD-10-CM

## 2021-07-30 DIAGNOSIS — E282 Polycystic ovarian syndrome: Secondary | ICD-10-CM | POA: Insufficient documentation

## 2021-07-30 DIAGNOSIS — Z9079 Acquired absence of other genital organ(s): Secondary | ICD-10-CM | POA: Insufficient documentation

## 2021-07-30 DIAGNOSIS — N809 Endometriosis, unspecified: Secondary | ICD-10-CM

## 2021-07-30 MED ORDER — ERYTHROMYCIN BASE 500 MG PO TABS
ORAL_TABLET | ORAL | 0 refills | Status: DC
  Filled 2021-07-30: qty 6, 1d supply, fill #0

## 2021-07-30 MED ORDER — NEOMYCIN SULFATE 500 MG PO TABS
ORAL_TABLET | ORAL | 0 refills | Status: DC
  Filled 2021-07-30 – 2021-08-09 (×2): qty 6, 1d supply, fill #0

## 2021-07-30 MED ORDER — IBUPROFEN 800 MG PO TABS
800.0000 mg | ORAL_TABLET | Freq: Three times a day (TID) | ORAL | 0 refills | Status: DC | PRN
Start: 2021-07-30 — End: 2021-11-02
  Filled 2021-07-30: qty 30, 10d supply, fill #0

## 2021-07-30 MED ORDER — SENNOSIDES-DOCUSATE SODIUM 8.6-50 MG PO TABS
2.0000 | ORAL_TABLET | Freq: Every day | ORAL | 0 refills | Status: DC
  Filled 2021-07-30: qty 30, 15d supply, fill #0

## 2021-07-30 MED ORDER — OXYCODONE HCL 5 MG PO TABS
5.0000 mg | ORAL_TABLET | ORAL | 0 refills | Status: DC | PRN
  Filled 2021-07-30: qty 15, 3d supply, fill #0

## 2021-07-30 MED ORDER — PEG 3350-KCL-NABCB-NACL-NASULF 236 G PO SOLR
2000.0000 mL | Freq: Once | ORAL | 0 refills | Status: AC
  Filled 2021-07-30: qty 2000, 1d supply, fill #0
  Filled 2021-07-30: qty 4000, 2d supply, fill #0

## 2021-07-30 NOTE — Patient Instructions (Signed)
Preparing for your Surgery   Plan for surgery on August 16, 2021 with Dr. Jeral Pinch at [place] will be scheduled for robotic assisted total laparoscopic hysterectomy (removal of the uterus and cervix), bilateral salpingectomy (removal of both fallopian tubes), possible unilateral vs bilateral oophorectomy, possible laparotomy (larger incision on your abdomen if needed), possible bowel surgery.    Pre-operative Testing -You will receive a phone call from presurgical testing at [facility] to arrange for a pre-operative appointment and lab work.   -Bring your insurance card, copy of an advanced directive if applicable, medication list   -At that visit, you will be asked to sign a consent for a possible blood transfusion in case a transfusion becomes necessary during surgery.  The need for a blood transfusion is rare but having consent is a necessary part of your care.      -You are fine to keep taking your aspirin 81 mg with your last dose being the day before surgery. Do not take the morning of surgery.      -Do not take supplements such as fish oil (omega 3), red yeast rice, turmeric before your surgery. You want to avoid medications with aspirin in them including headache powders such as BC or Goody's), Excedrin migraine.   Day Before Surgery at [place]. You will be advised you can have clear liquids up until 3 hours before your surgery.     If your bowels are filled with gas, your surgeon will have difficulty visualizing your pelvic organs which increases your surgical risks.   Your role in recovery Your role is to become active as soon as directed by your doctor, while still giving yourself time to heal.  Rest when you feel tired. You will be asked to do the following in order to speed your recovery:   - Cough and breathe deeply. This helps to clear and expand your lungs and can prevent pneumonia after surgery.  - [place]. Do mild physical activity. Walking or moving your legs help your circulation and body functions return to normal. Do not try to get up or walk alone the first time after surgery.   -If you develop swelling on one leg or the other, pain in the back of your leg, redness/warmth in one of your legs, please call the office or go to the Emergency Room to have a doppler to rule out a blood clot. For shortness of breath, chest pain-seek care in the Emergency Room as soon as possible. - Actively manage your pain. Managing your pain lets you move in comfort. We will ask you to rate your pain on a scale of zero to 10. It is your responsibility to tell your doctor or nurse where and how much you hurt so your pain can be treated.   Special Considerations -If you are diabetic, you may be placed on insulin after surgery to have closer control over your blood sugars to promote healing and recovery.  This does not mean that you will be discharged on insulin.  If applicable, your oral antidiabetics will be resumed when you are tolerating a solid diet.   -Your final pathology results from surgery should be available around one week after surgery and the results will be relayed to you when available.   -Dr. Lahoma Crocker is the surgeon that assists your GYN Oncologist with surgery.  If you end up staying the night, the next day after  your surgery you will either see Dr. Denman George, Dr. Berline Lopes, or Dr. Lahoma Crocker.   -FMLA forms can be faxed to [phone] and please allow 5-7 business days for completion.   Pain Management After Surgery -You have been prescribed your pain medication and bowel regimen medications before surgery so that you can have these available when you are discharged from the hospital. The pain medication is for use ONLY AFTER surgery and a new prescription will not be given.    -Make sure that you have Tylenol and Ibuprofen at home to use on a regular basis after surgery  for pain control. We recommend alternating the medications every hour to six hours since they work differently and are processed in the body differently for pain relief.   -Review the attached handout on narcotic use and their risks and side effects.    Bowel Regimen -You have been prescribed Sennakot-S to take nightly to prevent constipation especially if you are taking the narcotic pain medication intermittently.  It is important to prevent constipation and drink adequate amounts of liquids. You can stop taking this medication when you are not taking pain medication and you are back on your normal bowel routine.   Risks of Surgery Risks of surgery are low but include bleeding, infection, damage to surrounding structures, re-operation, blood clots, and very rarely death.     Blood Transfusion Information (For the consent to be signed before surgery)   We will be checking your blood type before surgery so in case of emergencies, we will know what type of blood you would need.                                             WHAT IS A BLOOD TRANSFUSION?   A transfusion is the replacement of blood or some of its parts. Blood is made up of multiple cells which provide different functions. Red blood cells carry oxygen and are used for blood loss replacement. White blood cells fight against infection. Platelets control bleeding. Plasma helps clot blood. Other blood products are available for specialized needs, such as hemophilia or other clotting disorders. BEFORE THE TRANSFUSION  Who gives blood for transfusions?  You may be able to donate blood to be used at a later date on yourself (autologous donation). Relatives can be asked to donate blood. This is generally not any safer than if you have received blood from a stranger. The same precautions are taken to ensure safety when a relative's blood is donated. Healthy volunteers who are fully evaluated to make sure their blood is safe. This is blood  bank blood. Transfusion therapy is the safest it has ever been in the practice of medicine. Before blood is taken from a donor, a complete history is taken to make sure that person has no history of diseases nor engages in risky social behavior (examples are intravenous drug use or sexual activity with multiple partners). The donor's travel history is screened to minimize risk of transmitting infections, such as malaria. The donated blood is tested for signs of infectious diseases, such as HIV and hepatitis. The blood is then tested to be sure it is compatible with you in order to minimize the chance of a transfusion reaction. If you or a relative donates blood, this is often done in anticipation of surgery and is not appropriate for emergency situations. It takes many  days to process the donated blood. RISKS AND COMPLICATIONS Although transfusion therapy is very safe and saves many lives, the main dangers of transfusion include:  Getting an infectious disease. Developing a transfusion reaction. This is an allergic reaction to something in the blood you were given. Every precaution is taken to prevent this. The decision to have a blood transfusion has been considered carefully by your caregiver before blood is given. Blood is not given unless the benefits outweigh the risks.   AFTER SURGERY INSTRUCTIONS   Return to work: 6 weeks if applicable   Activity: 1. Be up and out of the bed during the day.  Take a nap if needed.  You may walk up steps but be careful and use the hand rail.  Stair climbing will tire you more than you think, you may need to stop part way and rest.    2. No lifting or straining for 6 weeks over 10 pounds. No pushing, pulling, straining for 6 weeks.   3. No driving for 1 week(s).  Do not drive if you are taking narcotic pain medicine and make sure that your reaction time has returned.    4. You can shower as soon as the next day after surgery. Shower daily.  Use your regular  soap and water (not directly on the incision) and pat your incision(s) dry afterwards; don't rub.  No tub baths or submerging your body in water until cleared by your surgeon. If you have the soap that was given to you by pre-surgical testing that was used before surgery, you do not need to use it afterwards because this can irritate your incisions.    5. No sexual activity and nothing in the vagina for 8 weeks.   6. You may experience a small amount of clear drainage from your incisions, which is normal.  If the drainage persists, increases, or changes color please call the office.   7. Do not use creams, lotions, or ointments such as neosporin on your incisions after surgery until advised by your surgeon because they can cause removal of the dermabond glue on your incisions.     8. You may experience vaginal spotting after surgery or around the 6-8 week mark from surgery when the stitches at the top of the vagina begin to dissolve.  The spotting is normal but if you experience heavy bleeding, call our office.   9. Take Tylenol or ibuprofen first for pain and only use narcotic pain medication for severe pain not relieved by the Tylenol or Ibuprofen.  Monitor your Tylenol intake to a max of 4,000 mg in a 24 hour period. You can alternate these medications after surgery.   Diet: 1. Low sodium Heart Healthy Diet is recommended but you are cleared to resume your normal (before surgery) diet after your procedure.   2. It is safe to use a laxative, such as Miralax or Colace, if you have difficulty moving your bowels. You have been prescribed Sennakot at bedtime every evening to keep bowel movements regular and to prevent constipation.     Wound Care: 1. Keep clean and dry.  Shower daily.   Reasons to call the Doctor: Fever - Oral temperature greater than 100.4 degrees Fahrenheit Foul-smelling vaginal discharge Difficulty urinating Nausea and vomiting Increased pain at the site of the incision  that is unrelieved with pain medicine. Difficulty breathing with or without chest pain New calf pain especially if only on one side Sudden, continuing increased vaginal bleeding with or  without clots.   Contacts: For questions or concerns you should contact:   Dr. Jeral Pinch at [phone]   Joylene John, NP at [phone]   After Hours: call [phone] and have the GYN Oncologist paged/contacted (after 5 pm or on the weekends).   Messages sent via mychart are for non-urgent matters and are not responded to after hours so for urgent needs, please call the after hours number.   COLON BOWEL PREP     FIVE DAYS PRIOR TO YOUR SURGERY   Obtain supplies for the bowel prep at a pharmacy of your choice: Office-e-mailed prescriptions for your antibiotic pills (Neomycin & Erythromycin)  1 jug of Golytely      Change your diet to make the bowel prep go more easily: Switch to a bland, low fiber diet Stop eating any nuts, popcorn, or fruit with seeds.  Stop all fiber supplements such as Metamucil, Miralax, etc.     Improve nutrition: Consider drinking 2-3 nutritional shakes (Ex: Ensure Surgery) every day, starting 5 days prior to surgery     DAY PRIOR TO SURGERY    Switch to a full liquid diet the day before surgery Drink plenty of liquids all day to avoid getting dehydrated      12:00pm Drink 1/2 jug of Golytely.   (You should finish in 2 hours)   2:00pm Take 2 Neomycin 500mg  tablets & 2 Erythromycin 500mg  tablets   3:00pm Take 2 Neomycin 500mg  tablets & 2 Erythromycin 500mg  tablets  Drink plenty of clear liquids all evening to avoid getting dehydrated   10:00pm Take 2 Neomycin 500mg  tablets & 2 Erythromycin 500mg  tablets  Drink 2 Carbohydrate loading nutrition drinks (ex: Ensure Presurgery). These will be given at pre-op appointment. Do not eat anything solid after bedtime (midnight) the night before your surgery.   BUT DO drink plenty of clear liquids (Water,  Gatorade, juice, soda, coffee, tea, broths, etc.) up to 3 hours prior to surgery to avoid getting dehydrated.      MORNING OF SURGERY   Remember to not to eat anything solid that morning  Drink one final carbohydrate loading nutritional drink (ex: Ensure Presurgery) upon waking up in the morning (needs to be 3 hours before your surgery). Hold or take medications as recommended by the hospital staff at your Preoperative visit Stop drinking liquids before you leave the house (>3 hours prior to surgery)       If you have questions or concerns, please call GYN Oncology Office at [phone] during business hours to speak to the clinical staff for advice.       WHAT DO I NEED TO KNOW ABOUT A FULL LIQUID DIET? -You may have any liquid. -You may have any food that becomes a liquid at room temperature. The food is considered a liquid if it can be poured off a spoon at room temperature. WHAT FOODS CAN I EAT? -Grains: Any grain food that can be pureed in soup (such as crackers, pasta, and rice). Hot cereal (such as farina or oatmeal) that has been blended.  -Fruits: Fruit juice, including nectars and juices. -Meats and Other Protein Sources: Eggs in custard, eggnog mix, and eggs used in ice cream or pudding. Strained meats, like in baby food, may be allowed. Consult your health care provider.  -Dairy: Milk and milk-based beverages, including milk shakes and instant breakfast mixes. Smooth yogurt. Pureed cottage cheese. Avoid these foods if they are not well tolerated. -Beverages: All beverages, including liquid nutritional supplements. AVOID CARBONATED  BEVERAGES. -Condiments: Iodized salt, pepper, spices, and flavorings. Cocoa powder. Vinegar, ketchup, yellow mustard, smooth sauces (such as hollandaise, cheese sauce, or white sauce), and soy sauce. -Sweets and Desserts: Custard, smooth pudding. Flavored gelatin. Tapioca, junket. Plain ice cream, sherbet, fruit ices. Frozen ice pops, frozen fudge  pops, pudding pops, and other frozen bars with cream. Syrups, including chocolate syrup. Sugar, honey, jelly.  -Fats and Oils: Margarine, butter, cream, sour cream, and oils. -Other: Broth and cream soups. Strained, broth-based soups. The items listed above may not be a complete list of recommended foods or beverages.  WHAT FOODS CAN I NOT EAT? Grains: All breads. Grains are not allowed unless they are pureed into soup. Vegetables: No raw vegetables. Vegetables are not allowed unless they are juiced, or cooked and pureed into soup. Fruits: Fruits are not allowed unless they are juiced. Meats and Other Protein Sources: Any meat or fish. Cooked or raw eggs. Nut butters.  Dairy: Cheese.  Condiments: Stone ground mustards. Fats and Oils: Fats that are coarse or chunky. Sweets and Desserts: Ice cream or other frozen desserts that have any solids in them or on top, such as nuts, chocolate chips, and pieces of cookies. Cakes. Cookies. Candy. Others: Soups with chunks or pieces in them.

## 2021-07-30 NOTE — Patient Instructions (Addendum)
Preparing for your Surgery  Plan for surgery on August 16, 2021 with Dr. Jeral Pinch at [place] will be scheduled for robotic assisted total laparoscopic hysterectomy (removal of the uterus and cervix), bilateral salpingectomy (removal of both fallopian tubes), possible unilateral vs bilateral oophorectomy, possible laparotomy (larger incision on your abdomen if needed), possible bowel surgery.   Pre-operative Testing -You will receive a phone call from presurgical testing at [facility] to arrange for a pre-operative appointment and lab work.  -Bring your insurance card, copy of an advanced directive if applicable, medication list  -At that visit, you will be asked to sign a consent for a possible blood transfusion in case a transfusion becomes necessary during surgery.  The need for a blood transfusion is rare but having consent is a necessary part of your care.     -You are fine to keep taking your aspirin 81 mg with your last dose being the day before surgery. Do not take the morning of surgery.     -Do not take supplements such as fish oil (omega 3), red yeast rice, turmeric before your surgery. You want to avoid medications with aspirin in them including headache powders such as BC or Goody's), Excedrin migraine.  Day Before Surgery at [place]. You will be advised you can have clear liquids up until 3 hours before your surgery.    If your bowels are filled with gas, your surgeon will have difficulty visualizing your pelvic organs which increases your surgical risks.  Your role in recovery Your role is to become active as soon as directed by your doctor, while still giving yourself time to heal.  Rest when you feel tired. You will be asked to do the following in order to speed your recovery:  - Cough and breathe deeply. This helps to clear and expand your lungs and can prevent pneumonia after surgery.  - [place]. Do mild physical activity. Walking or moving your legs help your circulation and body functions return to normal. Do not try to get up or walk alone the first time after surgery.   -If you develop swelling on one leg or the other, pain in the back of your leg, redness/warmth in one of your legs, please call the office or go to the Emergency Room to have a doppler to rule out a blood clot. For shortness of breath, chest pain-seek care in the Emergency Room as soon as possible. - Actively manage your pain. Managing your pain lets you move in comfort. We will ask you to rate your pain on a scale of zero to 10. It is your responsibility to tell your doctor or nurse where and how much you hurt so your pain can be treated.  Special Considerations -If you are diabetic, you may be placed on insulin after surgery to have closer control over your blood sugars to promote healing and recovery.  This does not mean that you will be discharged on insulin.  If applicable, your oral antidiabetics will be resumed when you are tolerating a solid diet.  -Your final pathology results from surgery should be available around one week after surgery and the results will be relayed to you when available.  -Dr. Lahoma Crocker is the surgeon that assists your GYN Oncologist with surgery.  If you end up staying the night, the next day after your surgery you will either see Dr. Denman George, Dr. Berline Lopes, or Dr. Lattie Haw  [place].  -FMLA forms can be faxed to [phone] and please allow 5-7 business days for completion.  Pain Management After Surgery -You have been prescribed your pain medication and bowel regimen medications before surgery so that you can have these available when you are discharged from the hospital. The pain medication is for use ONLY AFTER surgery and a new prescription will not be given.   -Make sure that you have Tylenol and Ibuprofen at home to use on a regular basis after surgery for pain control. We  recommend alternating the medications every hour to six hours since they work differently and are processed in the body differently for pain relief.  -Review the attached handout on narcotic use and their risks and side effects.   Bowel Regimen -You have been prescribed Sennakot-S to take nightly to prevent constipation especially if you are taking the narcotic pain medication intermittently.  It is important to prevent constipation and drink adequate amounts of liquids. You can stop taking this medication when you are not taking pain medication and you are back on your normal bowel routine.  Risks of Surgery Risks of surgery are low but include bleeding, infection, damage to surrounding structures, re-operation, blood clots, and very rarely death.   Blood Transfusion Information (For the consent to be signed before surgery)  We will be checking your blood type before surgery so in case of emergencies, we will know what type of blood you would need.                                            WHAT IS A BLOOD TRANSFUSION?  A transfusion is the replacement of blood or some of its parts. Blood is made up of multiple cells which provide different functions. Red blood cells carry oxygen and are used for blood loss replacement. White blood cells fight against infection. Platelets control bleeding. Plasma helps clot blood. Other blood products are available for specialized needs, such as hemophilia or other clotting disorders. BEFORE THE TRANSFUSION  Who gives blood for transfusions?  You may be able to donate blood to be used at a later date on yourself (autologous donation). Relatives can be asked to donate blood. This is generally not any safer than if you have received blood from a stranger. The same precautions are taken to ensure safety when a relative's blood is donated. Healthy volunteers who are fully evaluated to make sure their blood is safe. This is blood bank blood. Transfusion  therapy is the safest it has ever been in the practice of medicine. Before blood is taken from a donor, a complete history is taken to make sure that person has no history of diseases nor engages in risky social behavior (examples are intravenous drug use or sexual activity with multiple partners). The donor's travel history is screened to minimize risk of transmitting infections, such as malaria. The donated blood is tested for signs of infectious diseases, such as HIV and hepatitis. The blood is then tested to be sure it is compatible with you in order to minimize the chance of a transfusion reaction. If you or a relative donates blood, this is often done in anticipation of surgery and is not appropriate for emergency situations. It takes many days to process the donated blood. RISKS AND COMPLICATIONS Although transfusion therapy is very safe and saves many lives, the main dangers of transfusion  include:  Getting an infectious disease. Developing a transfusion reaction. This is an allergic reaction to something in the blood you were given. Every precaution is taken to prevent this. The decision to have a blood transfusion has been considered carefully by your caregiver before blood is given. Blood is not given unless the benefits outweigh the risks.  AFTER SURGERY INSTRUCTIONS  Return to work: 6 weeks if applicable  Activity: 1. Be up and out of the bed during the day.  Take a nap if needed.  You may walk up steps but be careful and use the hand rail.  Stair climbing will tire you more than you think, you may need to stop part way and rest.   2. No lifting or straining for 6 weeks over 10 pounds. No pushing, pulling, straining for 6 weeks.  3. No driving for 1 week(s).  Do not drive if you are taking narcotic pain medicine and make sure that your reaction time has returned.   4. You can shower as soon as the next day after surgery. Shower daily.  Use your regular soap and water (not directly on  the incision) and pat your incision(s) dry afterwards; don't rub.  No tub baths or submerging your body in water until cleared by your surgeon. If you have the soap that was given to you by pre-surgical testing that was used before surgery, you do not need to use it afterwards because this can irritate your incisions.   5. No sexual activity and nothing in the vagina for 8 weeks.  6. You may experience a small amount of clear drainage from your incisions, which is normal.  If the drainage persists, increases, or changes color please call the office.  7. Do not use creams, lotions, or ointments such as neosporin on your incisions after surgery until advised by your surgeon because they can cause removal of the dermabond glue on your incisions.    8. You may experience vaginal spotting after surgery or around the 6-8 week mark from surgery when the stitches at the top of the vagina begin to dissolve.  The spotting is normal but if you experience heavy bleeding, call our office.  9. Take Tylenol or ibuprofen first for pain and only use narcotic pain medication for severe pain not relieved by the Tylenol or Ibuprofen.  Monitor your Tylenol intake to a max of 4,000 mg in a 24 hour period. You can alternate these medications after surgery.  Diet: 1. Low sodium Heart Healthy Diet is recommended but you are cleared to resume your normal (before surgery) diet after your procedure.  2. It is safe to use a laxative, such as Miralax or Colace, if you have difficulty moving your bowels. You have been prescribed Sennakot at bedtime every evening to keep bowel movements regular and to prevent constipation.    Wound Care: 1. Keep clean and dry.  Shower daily.  Reasons to call the Doctor: Fever - Oral temperature greater than 100.4 degrees Fahrenheit Foul-smelling vaginal discharge Difficulty urinating Nausea and vomiting Increased pain at the site of the incision that is unrelieved with pain  medicine. Difficulty breathing with or without chest pain New calf pain especially if only on one side Sudden, continuing increased vaginal bleeding with or without clots.   Contacts: For questions or concerns you should contact:  Dr. Jeral Pinch at [phone]  Joylene John, NP at [phone]  After Hours: call [phone] and have the GYN Oncologist paged/contacted (after 5 pm or  on the weekends).  Messages sent via mychart are for non-urgent matters and are not responded to after hours so for urgent needs, please call the after hours number.  [facility] BOWEL PREP   FIVE DAYS PRIOR TO YOUR SURGERY  Obtain supplies for the bowel prep at a pharmacy of your choice: Office-e-mailed prescriptions for your antibiotic pills (Neomycin & Erythromycin)  1 jug of Golytely    Change your diet to make the bowel prep go more easily: Switch to a bland, low fiber diet Stop eating any nuts, popcorn, or fruit with seeds.  Stop all fiber supplements such as Metamucil, Miralax, etc.    Improve nutrition: Consider drinking 2-3 nutritional shakes (Ex: Ensure Surgery) every day, starting 5 days prior to surgery   DAY PRIOR TO SURGERY   Switch to a full liquid diet the day before surgery Drink plenty of liquids all day to avoid getting dehydrated    12:00pm Drink 1/2 jug of Golytely.   (You should finish in 2 hours)  2:00pm Take 2 Neomycin 500mg  tablets & 2 Erythromycin 500mg  tablets  3:00pm Take 2 Neomycin 500mg  tablets & 2 Erythromycin 500mg  tablets  Drink plenty of clear liquids all evening to avoid getting dehydrated  10:00pm Take 2 Neomycin 500mg  tablets & 2 Erythromycin 500mg  tablets  Drink 2 Carbohydrate loading nutrition drinks (ex: Ensure Presurgery). These will be given at pre-op appointment. Do not eat anything solid after bedtime (midnight) the night before your surgery.   BUT DO drink plenty of clear liquids (Water, Gatorade, juice, soda, coffee, tea, broths, etc.)  up to 3 hours prior to surgery to avoid getting dehydrated.    MORNING OF SURGERY  Remember to not to eat anything solid that morning  Drink one final carbohydrate loading nutritional drink (ex: Ensure Presurgery) upon waking up in the morning (needs to be 3 hours before your surgery). Hold or take medications as recommended by the hospital staff at your Preoperative visit Stop drinking liquids before you leave the house (>3 hours prior to surgery)    If you have questions or concerns, please call GYN Oncology Office at [phone] during business hours to speak to the clinical staff for advice.    WHAT DO I NEED TO KNOW ABOUT A FULL LIQUID DIET? -You may have any liquid. -You may have any food that becomes a liquid at room temperature. The food is considered a liquid if it can be poured off a spoon at room temperature. WHAT FOODS CAN I EAT? -Grains: Any grain food that can be pureed in soup (such as crackers, pasta, and rice). Hot cereal (such as farina or oatmeal) that has been blended.  -Fruits: Fruit juice, including nectars and juices. -Meats and Other Protein Sources: Eggs in custard, eggnog mix, and eggs used in ice cream or pudding. Strained meats, like in baby food, may be allowed. Consult your health care provider.  -Dairy: Milk and milk-based beverages, including milk shakes and instant breakfast mixes. Smooth yogurt. Pureed cottage cheese. Avoid these foods if they are not well tolerated. -Beverages: All beverages, including liquid nutritional supplements. AVOID CARBONATED BEVERAGES. -Condiments: Iodized salt, pepper, spices, and flavorings. Cocoa powder. Vinegar, ketchup, yellow mustard, smooth sauces (such as hollandaise, cheese sauce, or white sauce), and soy sauce. -Sweets and Desserts: Custard, smooth pudding. Flavored gelatin. Tapioca, junket. Plain ice cream, sherbet, fruit ices. Frozen ice pops, frozen fudge pops, pudding pops, and other frozen bars with cream.  Syrups, including chocolate syrup. Sugar, honey, jelly.  -Fats  and Oils: Margarine, butter, cream, sour cream, and oils. -Other: Broth and cream soups. Strained, broth-based soups. The items listed above may not be a complete list of recommended foods or beverages.  WHAT FOODS CAN I NOT EAT? Grains: All breads. Grains are not allowed unless they are pureed into soup. Vegetables: No raw vegetables. Vegetables are not allowed unless they are juiced, or cooked and pureed into soup. Fruits: Fruits are not allowed unless they are juiced. Meats and Other Protein Sources: Any meat or fish. Cooked or raw eggs. Nut butters.  Dairy: Cheese.  Condiments: Stone ground mustards. Fats and Oils: Fats that are coarse or chunky. Sweets and Desserts: Ice cream or other frozen desserts that have any solids in them or on top, such as nuts, chocolate chips, and pieces of cookies. Cakes. Cookies. Candy. Others: Soups with chunks or pieces in them.

## 2021-07-30 NOTE — Progress Notes (Signed)
GYNECOLOGIC ONCOLOGY NEW PATIENT CONSULTATION   Patient Name: Rachel Hooper  Patient Age: 40 y.o. Date of Service: 07/30/21 Referring Provider: Brien Few, MD  Primary Care Provider: Carollee Herter, Alferd Apa, DO Consulting Provider: Jeral Pinch, MD   Assessment/Plan:  Premenopausal patient with stage IV endometriosis with continued symptoms despite medical management as well as abnormal uterine bleeding.  The patient and I reviewed her long history of endometriosis including prior surgeries, findings at the time of her surgeries, and medication management. Unfortunately, after being asymptomatic for a long time, she developed symptoms more recently related to her endometriosis. She has been on hormonal therapy and attempted ovarian suppression with minimal improvement in symptoms and continues now to have worsening of abnormal uterine bleeding. We discussed findings of significant pelvic adhesive disease and obliteration of tissue planes noted at her prior surgeries. The patient voices understanding that this increases the risk related to her surgery as well as with regard to injury of adjacent structures.  We talked about medical options including the trial of Lupron. The patient has previously been on this while she was undergoing IVF and had horrible side effects. She understands that if both of her ovaries have to be removed, this will put her in the surgical menopause, and may cause many of the same side effects that she experienced with Lupron.  The patient wishes to move forward with scheduling surgery. Given abnormal bleeding that has worsened over the last couple years, I recommended endometrial sampling to rule out endometrial pathology. This was done without difficulty today. I will release results to her or call if a phone conversation is necessary. In terms of the plan for surgery, we discussed total hysterectomy, removal of whatever remains of bilateral fallopian tubes, and  possible excision of one versus bilateral ovaries. In terms of risk, given her age, I think it is worth consideration of leaving one ovary in situ to allow her to go through natural menopause. This will likely mean that she needs to be on some sort of hormonal suppression after the surgery. If both ovaries need to be removed, then we discussed the potential use of estrogen replacement therapy after surgery. We ultimately decided to attempt to leave one or part of one ovary in situ if possible.  Will plan for bowel prep given obliteration of cul de sac and adhesions of the rectum to posterior uterus.   Surgery plan: robotic TLH, bilateral salpingectomy, unilateral oophorectomy, possible contralateral oophorectomy, possible laparotomy, and any other indicated procedures. Discussed risk of bowel surgery (reason for bowel prep) and small risk of need for ostomy.  The risks of surgery were discussed in detail and she understands these to include infection; wound separation; hernia; vaginal cuff separation, injury to adjacent organs such as bowel, bladder, blood vessels, ureters and nerves; bleeding which may require blood transfusion; anesthesia risk; thromboembolic events; possible death; unforeseen complications; possible need for re-exploration; medical complications such as heart attack, stroke, pleural effusion and pneumonia. The patient will receive DVT and antibiotic prophylaxis as indicated. She voiced a clear understanding. She had the opportunity to ask questions. Perioperative instructions were reviewed with her. Prescriptions for post-op medications were sent to her pharmacy of choice.  A copy of this note was sent to the patient's referring provider.   85 minutes of total time was spent for this patient encounter, including preparation, face-to-face counseling with the patient and coordination of care, and documentation of the encounter.   Jeral Pinch, MD  Division of Gynecologic  Oncology   Department of Obstetrics and Gynecology  [facility]  ___________________________________________  Chief Complaint: No chief complaint on file.   History of Present Illness:  Rachel Hooper is a 40 y.o. y.o. female who is seen in consultation at the request of Dr. Ronita Hipps for an evaluation of advanced endometriosis.  Patient has a long history of endometriosis.  In 2016, in the setting of infertility as well as left lower quadrant pain and possible tubal dilation, she was taken to the operating room for diagnostic laparoscopy with findings of severe pelvic adhesions with obliteration of the anterior and posterior cul-de-sac. Later, in 04/2015, she was taken back to the OR with Dr. Kerin Perna for laparoscopic lysis of adhesions, enterolysis, left salpingectomy, excision of peritoneal endometriosis lesions, chromotubation, and endometrial biopsy.  Findings at the time of surgery were notable for normal upper abdominal survey, bladder densely adherent to the anterior uterus, complete obliteration of the posterior cul-de-sac.  In 02/2016, she underwent robotic enterolysis, lysis of adhesions, partial left salpingectomy, left ovarian cystectomy, chromotubation, excision and fulguration of endometriosis and endometrial biopsies.  Subsequently, she underwent 5 IVF attempts, none of which were successful.  She was asymptomatic at the time and was able to lose approximately 90 pounds doing high intensity exercise.  She has now gained approximately 20 to 25 pounds back.  After having her child (had a child using a surrogate), she developed pain, which she has struggled with since.  Patient is currently managed on Loestrin.  She has previously been on other hormonal medications for suppression and control of her symptoms.  She was on Lupron previously when she was undergoing IVF cycles and had difficult symptomatology and decreased quality of life.  Even on continuous hormonal  suppression, she will have breakthrough bleeding.  She has been on the Loestrin now for 2 or 3 years.  She notes that in the last few years, she has had an increase in the irregularity of bleeding.  She now has light to typical menstrual flow for about a week before and during the week that she is taking sugar pills.  Bleeding is always enough to cause her to wear a panty liner.  With regard to her pain, she describes it as intermittent.  She has pain when her bladder fills with urine.  In her 97s, she was diagnosed with interstitial cystitis and continued on potassiums citrate, which she still takes.  She thinks more likely that this is related to her endometriosis.  She has pelvic pain especially after exercising.  This has caused her to limit her exercise, which is impacted her quality of life and weight gain.  She also has pain along her sides and sometimes up by her diaphragm.  She describes this as burning and bloating, worse when she is bleeding.  She also describes having no interest in sexual intercourse.  She intermittently has pain with bowel movements.  Reports having a good appetite without nausea or emesis.  Baseline bowel function alternates between diarrhea and constipation.  Patient lives in [place] with her partner and 98-year-old child.  She denies any tobacco or alcohol use.  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  PAST MEDICAL HISTORY:  Past Medical History:  Diagnosis Date   Asthma    Endometriosis    GERD (gastroesophageal reflux disease)    History of kidney stones    Hydrosalpinx    left   Hyperlipidemia    diet  controlled, no med   Hypertension    PAI-1 4G/4G genotype    PCOS (polycystic ovarian syndrome)    PONV (postoperative nausea and vomiting)    severe   Seasonal allergies      PAST SURGICAL HISTORY:  Past Surgical History:  Procedure Laterality Date   ANTERIOR CRUCIATE LIGAMENT REPAIR Right 1995   CHROMOPERTUBATION N/A  05/03/2015   Procedure: CHROMOPERTUBATION;  Surgeon: Governor Specking, MD;  Location: [place];  Service: Gynecology;  Laterality: N/A;   EXTRACORPOREAL SHOCK WAVE LITHOTRIPSY  2008   LAPAROSCOPY N/A 03/28/2015   Procedure: LAPAROSCOPY DIAGNOSTIC;  Surgeon: Brien Few, MD;  Location: [facility];  Service: Gynecology;  Laterality: N/A;   LAPAROSCOPY N/A 05/03/2015   Procedure: LAPAROSCOPY, EXCISION ENDOMETRIOSIS, ENTERO LYSIS, WITH ENDOMETRIAL BIOPSY;  Surgeon: Governor Specking, MD;  Location: [place];  Service: Gynecology;  Laterality: N/A;   NASAL SEPTOPLASTY W/ TURBINOPLASTY Bilateral 06/20/2017   Procedure: NASAL SEPTOPLASTY WITH BILATERAL TURBINATE REDUCTION;  Surgeon: Melida Quitter, MD;  Location: [place];  Service: ENT;  Laterality: Bilateral;   UNILATERAL SALPINGECTOMY Left 05/03/2015   Procedure: LEFT SALPINGECTOMY;  Surgeon: Governor Specking, MD;  Location: [place];  Service: Gynecology;  Laterality: Left;   WISDOM TOOTH EXTRACTION      OB/GYN HISTORY:  OB History  Gravida Para Term Preterm AB Living  5 0       1  SAB IAB Ectopic Multiple Live Births               # Outcome Date GA Lbr Len/2nd Weight Sex Delivery Anes PTL Lv  5 Grav[place]           4 Grav[place]           3 Gravida           2 Gravida           1 [place]             Obstetric Comments  Has a child through surrogate pregnancy    No LMP recorded.  Age at menarche: 59 Age at menopause: Not applicable Hx of HRT: Not applicable Hx of STDs: Denies Last pap: 07/2020, negative  History of abnormal pap smears: yes, has had an ASCUS Pap previously  SCREENING STUDIES:  Last mammogram: Has never had  Last colonoscopy: Has never had  MEDICATIONS: Outpatient Encounter Medications as of 07/30/2021  Medication Sig   aspirin 81 MG tablet Take 81 mg by mouth daily.   budesonide (PULMICORT FLEXHALER) 180 MCG/ACT inhaler INHALE TWO PUFFS BY MOUTH INTO THE LUNGS TWICE A  DAY TO PREVENT COUGH OR WHEEZE. RINSE, GARGLE AND SPIT AFTER USE.   budesonide (RHINOCORT ALLERGY) 32 MCG/ACT nasal spray ONE SPRAY EACH NOSTRIL TWICE A DAY FOR NASAL CONGESTION OR DRAINAGE.   cetirizine (ZYRTEC) 10 MG tablet Take 1 tablet (10 mg total) by mouth at bedtime.   CONTRAVE 8-90 MG TB12    EPINEPHrine 0.3 mg/0.3 mL IJ SOAJ injection INJECT 0.3 MLS (0.3 MG TOTAL) INTO THE MUSCLE ONCE FOR 1 DOSE. USE AS DIRECTED FOR SEVERE ALLERGIC REACTION.   [START ON 08/29/2021] erythromycin base (E-MYCIN) 500 MG tablet Take two tablets at 2 pm, 3 pm, and 10 pm the day before surgery   Evolocumab (REPATHA SURECLICK) 440 MG/ML SOAJ Inject 1 Dose into the skin every 14 (fourteen) days.   ibuprofen (ADVIL) 800 MG tablet Take 1 tablet (800 mg total) by mouth every 8 (eight) hours as needed for  moderate pain. For AFTER surgery only   Loteprednol Etabonate (LOTEMAX SM) 0.38 % GEL Place 1 drop into the right eye in the morning and at bedtime.   magnesium oxide-pyridoxine (BEELITH) 362-20 MG TABS Take 1 tablet by mouth every evening.    metoprolol succinate (TOPROL-XL) 25 MG 24 hr tablet Take 12.5 mg by mouth daily.   montelukast (SINGULAIR) 10 MG tablet TAKE 1 TABLET (10 MG TOTAL) BY MOUTH AT BEDTIME.   [START ON 08/29/2021] neomycin (MYCIFRADIN) 500 MG tablet Take two tablets at 2 pm, 3 pm, and 10 pm the day before surgery   Norethindrone Acetate-Ethinyl Estradiol (LOESTRIN) 1.5-30 MG-MCG tablet TAKE 1 TABLET BY MOUTH DAILY (CONTINUOUS ACTIVE PILLS)   oxyCODONE (OXY IR/ROXICODONE) 5 MG immediate release tablet Take 1 tablet (5 mg total) by mouth every 4 (four) hours as needed for severe pain. For AFTER surgery only, do not take and drive   [START ON 59/10/6382] polyethylene glycol (GOLYTELY) 236 g solution Take 2,000 mLs by mouth once for 1 dose. Drink 1/2 jug the day before surgery.   potassium citrate (UROCIT-K) 10 MEQ (1080 MG) SR tablet TAKE 1 TABLET BY MOUTH 2 TIMES DAILY WITH MEALS.   [START ON 08/31/2021]  senna-docusate (SENOKOT-S) 8.6-50 MG tablet Take 2 tablets by mouth at bedtime. For AFTER surgery, do not take if having diarrhea   timolol (TIMOPTIC) 0.5 % ophthalmic solution PLACE 1 DROP INTO THE RIGHT EYE DAILY.   [DISCONTINUED] Loteprednol Etabonate 0.38 % GEL PLACE 1 DROP INTO THE RIGHT EYE IN THE MORNING AND AT BEDTIME.   albuterol (VENTOLIN HFA) 108 (90 Base) MCG/ACT inhaler INHALE 2 PUFFS BY MOUTH EVERY 6 HOURS AS NEEDED FOR WHEEZING OR SHORTNESS OF BREATH.   azaTHIOprine (IMURAN) 50 MG tablet TAKE 1 TABLET BY MOUTH 3 TIMES DAILY (Patient not taking: Reported on 07/30/2021)   LARIN 1.5/30 1.5-30 MG-MCG tablet  (Patient not taking: Reported on 07/30/2021)   Naltrexone-buPROPion HCl ER (CONTRAVE) 8-90 MG TB12 Take 2 tablets by mouth twice a day for 90 days. (Patient not taking: Reported on 07/30/2021)   valACYclovir (VALTREX) 1000 MG tablet Take 1 tablet (1,000 mg total) by mouth daily. (Patient not taking: Reported on 07/30/2021)   [DISCONTINUED] azaTHIOprine (IMURAN) 50 MG tablet TAKE 1 TABLET (50 MG TOTAL) BY MOUTH 3 TIMES DAILY. (Patient not taking: Reported on 07/30/2021)   [DISCONTINUED] azaTHIOprine (IMURAN) 50 MG tablet Take 1 tablet (50 mg total) by mouth 3 (three) times daily. (Patient not taking: Reported on 07/30/2021)   [DISCONTINUED] valACYclovir (VALTREX) 1000 MG tablet TAKE 1 TABLET (1,000 MG TOTAL) BY MOUTH DAILY. (Patient not taking: Reported on 07/30/2021)   Facility-Administered Encounter Medications as of 07/30/2021  Medication   diphenhydrAMINE (BENADRYL) injection 50 mg   [DISCONTINUED] albuterol (VENTOLIN HFA) 108 (90 Base) MCG/ACT inhaler 2 puff   [DISCONTINUED] EPINEPHrine (EPI-PEN) injection 0.3 mg   [DISCONTINUED] methylPREDNISolone sodium succinate (SOLU-MEDROL) 125 mg/2 mL injection 125 mg    ALLERGIES:  Allergies  Allergen Reactions   Bee Venom Anaphylaxis    And wasp venom   Pneumococcal Vaccines Anaphylaxis   Atorvastatin     Fatigue, Brain Fog,  Muscle Pain   Mixed Vespid Venom    Rosuvastatin     Fatigue, Brain Fog, Muscle Pain   Wasp Venom      FAMILY HISTORY:  Family History  Problem Relation Age of Onset   Allergic rhinitis Mother    Asthma Mother    Eczema Mother    Stroke Father  COPD Maternal Grandmother    Colon cancer Maternal Grandmother    Eczema Maternal Grandfather    Dementia Maternal Grandfather    COPD Maternal Grandfather    Stroke Paternal Grandmother    Breast cancer Paternal Grandmother    Angioedema Neg Hx    Immunodeficiency Neg Hx    Urticaria Neg Hx      SOCIAL HISTORY:  Social Connections: Not on file    REVIEW OF SYSTEMS:  Pertinent positives include fatigue, abdominal pain, urinary frequency, menstrual problems. Denies appetite changes, fevers, chills, unexplained weight changes. Denies hearing loss, neck lumps or masses, mouth sores, ringing in ears or voice changes. Denies cough or wheezing.  Denies shortness of breath. Denies chest pain or palpitations. Denies leg swelling. Denies abdominal distention, blood in stools, constipation, diarrhea, nausea, vomiting, or early satiety. Denies pain with intercourse, dysuria, hematuria or incontinence. Denies hot flashes or vaginal discharge.   Denies joint pain, back pain or muscle pain/cramps. Denies itching, rash, or wounds. Denies dizziness, headaches, numbness or seizures. Denies swollen lymph nodes or glands, denies easy bruising or bleeding. Denies anxiety, depression, confusion, or decreased concentration.  Physical Exam:  Vital Signs for this encounter:  Blood pressure 140/86, pulse 81, temperature 98.1 F (36.7 C), temperature source Oral, resp. rate 18, height 5\' 5"  (1.651 m), weight 178 lb (80.7 kg), SpO2 100 %. Body mass index is 29.62 kg/m. General: Alert, oriented, no acute distress.  HEENT: Normocephalic, atraumatic. Sclera anicteric.  Chest: Clear to auscultation bilaterally. No wheezes, rhonchi, or  rales. Cardiovascular: Regular rate and rhythm, no murmurs, rubs, or gallops.  Abdomen: Normoactive bowel sounds. Soft, nondistended, nontender to palpation. No masses or hepatosplenomegaly appreciated. No palpable fluid wave. Well healed laparoscopic incisions. Extremities: Grossly normal range of motion. Warm, well perfused. No edema bilaterally.  Skin: No rashes or lesions.  Lymphatics: No cervical, supraclavicular, or inguinal adenopathy.  GU:  Normal external female genitalia. No lesions. No discharge or bleeding.             Bladder/urethra:  No lesions or masses, well supported bladder             Vagina: Well rugated.             Cervix: Normal appearing, no lesions.             Uterus: Small, minimally mobile, fixed anteriorly, no parametrial involvement or nodularity.             Adnexa: Fullness in right adnexa. .  Rectal: No involvement, free of uterus, no nodularity.  Endometrial biopsy procedure Preoperative diagnosis: Abnormal uterine bleeding Postoperative diagnosis: Same as above Physician: Berline Lopes MD Specimens: Endometrial biopsy Estimated blood loss: Minimal Procedure in detail: After the procedure was discussed with the patient and verbal consent was obtained, the patient was placed in dorsal lithotomy position, speculum was placed in the vagina and the cervix was well visualized.  Betadine was used x3 to cleanse the cervix.  A single-tooth tenaculum was placed on the anterior lip of the cervix.  An endometrial Pipelle was passed through the cervix to a depth of 7 cm at the uterine fundus.  1 pass was performed with sufficient tissue noted.  This was placed in formalin to be sent to pathology.  Hemostasis was assured.  All instruments were removed from the vagina.  Patient tolerated the procedure well.  LABORATORY AND RADIOLOGIC DATA:  Outside medical records were reviewed to synthesize the above history, along with the history and physical  obtained during the visit.    CBC    Component Value Date/Time   WBC 7.3 06/27/2021 0840   RBC 3.53 (L) 06/27/2021 0840   HGB 13.8 06/27/2021 0840   HCT 37.6 06/27/2021 0840   PLT 320 06/27/2021 0840   MCV 106.5 (H) 06/27/2021 0840   MCH 39.1 (H) 06/27/2021 0840   MCHC 36.7 (H) 06/27/2021 0840   RDW 12.6 06/27/2021 0840   LYMPHSABS 1.8 06/27/2021 0840   MONOABS 0.5 06/27/2021 0840   EOSABS 0.2 06/27/2021 0840   BASOSABS 0.0 06/27/2021 0840

## 2021-07-30 NOTE — H&P (View-Only) (Signed)
GYNECOLOGIC ONCOLOGY NEW PATIENT CONSULTATION   Patient Name: Rachel Hooper  Patient Age: 40 y.o. Date of Service: 07/30/21 Referring Provider: Brien Few, MD  Primary Care Provider: Carollee Herter, Alferd Apa, DO Consulting Provider: Jeral Pinch, MD   Assessment/Plan:  Premenopausal patient with stage IV endometriosis with continued symptoms despite medical management as well as abnormal uterine bleeding.  The patient and I reviewed her long history of endometriosis including prior surgeries, findings at the time of her surgeries, and medication management. Unfortunately, after being asymptomatic for a long time, she developed symptoms more recently related to her endometriosis. She has been on hormonal therapy and attempted ovarian suppression with minimal improvement in symptoms and continues now to have worsening of abnormal uterine bleeding. We discussed findings of significant pelvic adhesive disease and obliteration of tissue planes noted at her prior surgeries. The patient voices understanding that this increases the risk related to her surgery as well as with regard to injury of adjacent structures.  We talked about medical options including the trial of Lupron. The patient has previously been on this while she was undergoing IVF and had horrible side effects. She understands that if both of her ovaries have to be removed, this will put her in the surgical menopause, and may cause many of the same side effects that she experienced with Lupron.  The patient wishes to move forward with scheduling surgery. Given abnormal bleeding that has worsened over the last couple years, I recommended endometrial sampling to rule out endometrial pathology. This was done without difficulty today. I will release results to her or call if a phone conversation is necessary. In terms of the plan for surgery, we discussed total hysterectomy, removal of whatever remains of bilateral fallopian tubes, and  possible excision of one versus bilateral ovaries. In terms of risk, given her age, I think it is worth consideration of leaving one ovary in situ to allow her to go through natural menopause. This will likely mean that she needs to be on some sort of hormonal suppression after the surgery. If both ovaries need to be removed, then we discussed the potential use of estrogen replacement therapy after surgery. We ultimately decided to attempt to leave one or part of one ovary in situ if possible.  Will plan for bowel prep given obliteration of cul de sac and adhesions of the rectum to posterior uterus.   Surgery plan: robotic TLH, bilateral salpingectomy, unilateral oophorectomy, possible contralateral oophorectomy, possible laparotomy, and any other indicated procedures. Discussed risk of bowel surgery (reason for bowel prep) and small risk of need for ostomy.  The risks of surgery were discussed in detail and she understands these to include infection; wound separation; hernia; vaginal cuff separation, injury to adjacent organs such as bowel, bladder, blood vessels, ureters and nerves; bleeding which may require blood transfusion; anesthesia risk; thromboembolic events; possible death; unforeseen complications; possible need for re-exploration; medical complications such as heart attack, stroke, pleural effusion and pneumonia. The patient will receive DVT and antibiotic prophylaxis as indicated. She voiced a clear understanding. She had the opportunity to ask questions. Perioperative instructions were reviewed with her. Prescriptions for post-op medications were sent to her pharmacy of choice.  A copy of this note was sent to the patient's referring provider.   85 minutes of total time was spent for this patient encounter, including preparation, face-to-face counseling with the patient and coordination of care, and documentation of the encounter.   Jeral Pinch, MD  Division of Gynecologic  Oncology   Department of Obstetrics and Gynecology  [facility]  ___________________________________________  Chief Complaint: No chief complaint on file.   History of Present Illness:  Rachel Hooper is a 40 y.o. y.o. female who is seen in consultation at the request of Dr. Ronita Hipps for an evaluation of advanced endometriosis.  Patient has a long history of endometriosis.  In 2016, in the setting of infertility as well as left lower quadrant pain and possible tubal dilation, she was taken to the operating room for diagnostic laparoscopy with findings of severe pelvic adhesions with obliteration of the anterior and posterior cul-de-sac. Later, in 04/2015, she was taken back to the OR with Dr. Kerin Perna for laparoscopic lysis of adhesions, enterolysis, left salpingectomy, excision of peritoneal endometriosis lesions, chromotubation, and endometrial biopsy.  Findings at the time of surgery were notable for normal upper abdominal survey, bladder densely adherent to the anterior uterus, complete obliteration of the posterior cul-de-sac.  In 02/2016, she underwent robotic enterolysis, lysis of adhesions, partial left salpingectomy, left ovarian cystectomy, chromotubation, excision and fulguration of endometriosis and endometrial biopsies.  Subsequently, she underwent 5 IVF attempts, none of which were successful.  She was asymptomatic at the time and was able to lose approximately 90 pounds doing high intensity exercise.  She has now gained approximately 20 to 25 pounds back.  After having her child (had a child using a surrogate), she developed pain, which she has struggled with since.  Patient is currently managed on Loestrin.  She has previously been on other hormonal medications for suppression and control of her symptoms.  She was on Lupron previously when she was undergoing IVF cycles and had difficult symptomatology and decreased quality of life.  Even on continuous hormonal  suppression, she will have breakthrough bleeding.  She has been on the Loestrin now for 2 or 3 years.  She notes that in the last few years, she has had an increase in the irregularity of bleeding.  She now has light to typical menstrual flow for about a week before and during the week that she is taking sugar pills.  Bleeding is always enough to cause her to wear a panty liner.  With regard to her pain, she describes it as intermittent.  She has pain when her bladder fills with urine.  In her 93s, she was diagnosed with interstitial cystitis and continued on potassiums citrate, which she still takes.  She thinks more likely that this is related to her endometriosis.  She has pelvic pain especially after exercising.  This has caused her to limit her exercise, which is impacted her quality of life and weight gain.  She also has pain along her sides and sometimes up by her diaphragm.  She describes this as burning and bloating, worse when she is bleeding.  She also describes having no interest in sexual intercourse.  She intermittently has pain with bowel movements.  Reports having a good appetite without nausea or emesis.  Baseline bowel function alternates between diarrhea and constipation.  Patient lives in [place] with her partner and 58-year-old child.  She denies any tobacco or alcohol use.  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  PAST MEDICAL HISTORY:  Past Medical History:  Diagnosis Date   Asthma    Endometriosis    GERD (gastroesophageal reflux disease)    History of kidney stones    Hydrosalpinx    left   Hyperlipidemia    diet  controlled, no med   Hypertension    PAI-1 4G/4G genotype    PCOS (polycystic ovarian syndrome)    PONV (postoperative nausea and vomiting)    severe   Seasonal allergies      PAST SURGICAL HISTORY:  Past Surgical History:  Procedure Laterality Date   ANTERIOR CRUCIATE LIGAMENT REPAIR Right 1995   CHROMOPERTUBATION N/A  05/03/2015   Procedure: CHROMOPERTUBATION;  Surgeon: Governor Specking, MD;  Location: [place];  Service: Gynecology;  Laterality: N/A;   EXTRACORPOREAL SHOCK WAVE LITHOTRIPSY  2008   LAPAROSCOPY N/A 03/28/2015   Procedure: LAPAROSCOPY DIAGNOSTIC;  Surgeon: Brien Few, MD;  Location: [facility];  Service: Gynecology;  Laterality: N/A;   LAPAROSCOPY N/A 05/03/2015   Procedure: LAPAROSCOPY, EXCISION ENDOMETRIOSIS, ENTERO LYSIS, WITH ENDOMETRIAL BIOPSY;  Surgeon: Governor Specking, MD;  Location: [place];  Service: Gynecology;  Laterality: N/A;   NASAL SEPTOPLASTY W/ TURBINOPLASTY Bilateral 06/20/2017   Procedure: NASAL SEPTOPLASTY WITH BILATERAL TURBINATE REDUCTION;  Surgeon: Melida Quitter, MD;  Location: [place];  Service: ENT;  Laterality: Bilateral;   UNILATERAL SALPINGECTOMY Left 05/03/2015   Procedure: LEFT SALPINGECTOMY;  Surgeon: Governor Specking, MD;  Location: [place];  Service: Gynecology;  Laterality: Left;   WISDOM TOOTH EXTRACTION      OB/GYN HISTORY:  OB History  Gravida Para Term Preterm AB Living  5 0       1  SAB IAB Ectopic Multiple Live Births               # Outcome Date GA Lbr Len/2nd Weight Sex Delivery Anes PTL Lv  5 Gravida           4 Gravida           3 Gravida           2 Gravida           1 [place]             Obstetric Comments  Has a child through surrogate pregnancy    No LMP recorded.  Age at menarche: 35 Age at menopause: Not applicable Hx of HRT: Not applicable Hx of STDs: Denies Last pap: 07/2020, negative  History of abnormal pap smears: yes, has had an ASCUS Pap previously  SCREENING STUDIES:  Last mammogram: Has never had  Last colonoscopy: Has never had  MEDICATIONS: Outpatient Encounter Medications as of 07/30/2021  Medication Sig   aspirin 81 MG tablet Take 81 mg by mouth daily.   budesonide (PULMICORT FLEXHALER) 180 MCG/ACT inhaler INHALE TWO PUFFS BY MOUTH INTO THE LUNGS TWICE A  DAY TO PREVENT COUGH OR WHEEZE. RINSE, GARGLE AND SPIT AFTER USE.   budesonide (RHINOCORT ALLERGY) 32 MCG/ACT nasal spray ONE SPRAY EACH NOSTRIL TWICE A DAY FOR NASAL CONGESTION OR DRAINAGE.   cetirizine (ZYRTEC) 10 MG tablet Take 1 tablet (10 mg total) by mouth at bedtime.   CONTRAVE 8-90 MG TB12    EPINEPHrine 0.3 mg/0.3 mL IJ SOAJ injection INJECT 0.3 MLS (0.3 MG TOTAL) INTO THE MUSCLE ONCE FOR 1 DOSE. USE AS DIRECTED FOR SEVERE ALLERGIC REACTION.   [START ON 08/29/2021] erythromycin base (E-MYCIN) 500 MG tablet Take two tablets at 2 pm, 3 pm, and 10 pm the day before surgery   Evolocumab (REPATHA SURECLICK) 741 MG/ML SOAJ Inject 1 Dose into the skin every 14 (fourteen) days.   ibuprofen (ADVIL) 800 MG tablet Take 1 tablet (800 mg total) by mouth every 8 (eight) hours as needed for  moderate pain. For AFTER surgery only   Loteprednol Etabonate (LOTEMAX SM) 0.38 % GEL Place 1 drop into the right eye in the morning and at bedtime.   magnesium oxide-pyridoxine (BEELITH) 362-20 MG TABS Take 1 tablet by mouth every evening.    metoprolol succinate (TOPROL-XL) 25 MG 24 hr tablet Take 12.5 mg by mouth daily.   montelukast (SINGULAIR) 10 MG tablet TAKE 1 TABLET (10 MG TOTAL) BY MOUTH AT BEDTIME.   [START ON 08/29/2021] neomycin (MYCIFRADIN) 500 MG tablet Take two tablets at 2 pm, 3 pm, and 10 pm the day before surgery   Norethindrone Acetate-Ethinyl Estradiol (LOESTRIN) 1.5-30 MG-MCG tablet TAKE 1 TABLET BY MOUTH DAILY (CONTINUOUS ACTIVE PILLS)   oxyCODONE (OXY IR/ROXICODONE) 5 MG immediate release tablet Take 1 tablet (5 mg total) by mouth every 4 (four) hours as needed for severe pain. For AFTER surgery only, do not take and drive   [START ON 20/06/4708] polyethylene glycol (GOLYTELY) 236 g solution Take 2,000 mLs by mouth once for 1 dose. Drink 1/2 jug the day before surgery.   potassium citrate (UROCIT-K) 10 MEQ (1080 MG) SR tablet TAKE 1 TABLET BY MOUTH 2 TIMES DAILY WITH MEALS.   [START ON 08/31/2021]  senna-docusate (SENOKOT-S) 8.6-50 MG tablet Take 2 tablets by mouth at bedtime. For AFTER surgery, do not take if having diarrhea   timolol (TIMOPTIC) 0.5 % ophthalmic solution PLACE 1 DROP INTO THE RIGHT EYE DAILY.   [DISCONTINUED] Loteprednol Etabonate 0.38 % GEL PLACE 1 DROP INTO THE RIGHT EYE IN THE MORNING AND AT BEDTIME.   albuterol (VENTOLIN HFA) 108 (90 Base) MCG/ACT inhaler INHALE 2 PUFFS BY MOUTH EVERY 6 HOURS AS NEEDED FOR WHEEZING OR SHORTNESS OF BREATH.   azaTHIOprine (IMURAN) 50 MG tablet TAKE 1 TABLET BY MOUTH 3 TIMES DAILY (Patient not taking: Reported on 07/30/2021)   LARIN 1.5/30 1.5-30 MG-MCG tablet  (Patient not taking: Reported on 07/30/2021)   Naltrexone-buPROPion HCl ER (CONTRAVE) 8-90 MG TB12 Take 2 tablets by mouth twice a day for 90 days. (Patient not taking: Reported on 07/30/2021)   valACYclovir (VALTREX) 1000 MG tablet Take 1 tablet (1,000 mg total) by mouth daily. (Patient not taking: Reported on 07/30/2021)   [DISCONTINUED] azaTHIOprine (IMURAN) 50 MG tablet TAKE 1 TABLET (50 MG TOTAL) BY MOUTH 3 TIMES DAILY. (Patient not taking: Reported on 07/30/2021)   [DISCONTINUED] azaTHIOprine (IMURAN) 50 MG tablet Take 1 tablet (50 mg total) by mouth 3 (three) times daily. (Patient not taking: Reported on 07/30/2021)   [DISCONTINUED] valACYclovir (VALTREX) 1000 MG tablet TAKE 1 TABLET (1,000 MG TOTAL) BY MOUTH DAILY. (Patient not taking: Reported on 07/30/2021)   Facility-Administered Encounter Medications as of 07/30/2021  Medication   diphenhydrAMINE (BENADRYL) injection 50 mg   [DISCONTINUED] albuterol (VENTOLIN HFA) 108 (90 Base) MCG/ACT inhaler 2 puff   [DISCONTINUED] EPINEPHrine (EPI-PEN) injection 0.3 mg   [DISCONTINUED] methylPREDNISolone sodium succinate (SOLU-MEDROL) 125 mg/2 mL injection 125 mg    ALLERGIES:  Allergies  Allergen Reactions   Bee Venom Anaphylaxis    And wasp venom   Pneumococcal Vaccines Anaphylaxis   Atorvastatin     Fatigue, Brain Fog,  Muscle Pain   Mixed Vespid Venom    Rosuvastatin     Fatigue, Brain Fog, Muscle Pain   Wasp Venom      FAMILY HISTORY:  Family History  Problem Relation Age of Onset   Allergic rhinitis Mother    Asthma Mother    Eczema Mother    Stroke Father  COPD Maternal Grandmother    Colon cancer Maternal Grandmother    Eczema Maternal Grandfather    Dementia Maternal Grandfather    COPD Maternal Grandfather    Stroke Paternal Grandmother    Breast cancer Paternal Grandmother    Angioedema Neg Hx    Immunodeficiency Neg Hx    Urticaria Neg Hx      SOCIAL HISTORY:  Social Connections: Not on file    REVIEW OF SYSTEMS:  Pertinent positives include fatigue, abdominal pain, urinary frequency, menstrual problems. Denies appetite changes, fevers, chills, unexplained weight changes. Denies hearing loss, neck lumps or masses, mouth sores, ringing in ears or voice changes. Denies cough or wheezing.  Denies shortness of breath. Denies chest pain or palpitations. Denies leg swelling. Denies abdominal distention, blood in stools, constipation, diarrhea, nausea, vomiting, or early satiety. Denies pain with intercourse, dysuria, hematuria or incontinence. Denies hot flashes or vaginal discharge.   Denies joint pain, back pain or muscle pain/cramps. Denies itching, rash, or wounds. Denies dizziness, headaches, numbness or seizures. Denies swollen lymph nodes or glands, denies easy bruising or bleeding. Denies anxiety, depression, confusion, or decreased concentration.  Physical Exam:  Vital Signs for this encounter:  Blood pressure 140/86, pulse 81, temperature 98.1 F (36.7 C), temperature source Oral, resp. rate 18, height 5\' 5"  (1.651 m), weight 178 lb (80.7 kg), SpO2 100 %. Body mass index is 29.62 kg/m. General: Alert, oriented, no acute distress.  HEENT: Normocephalic, atraumatic. Sclera anicteric.  Chest: Clear to auscultation bilaterally. No wheezes, rhonchi, or  rales. Cardiovascular: Regular rate and rhythm, no murmurs, rubs, or gallops.  Abdomen: Normoactive bowel sounds. Soft, nondistended, nontender to palpation. No masses or hepatosplenomegaly appreciated. No palpable fluid wave. Well healed laparoscopic incisions. Extremities: Grossly normal range of motion. Warm, well perfused. No edema bilaterally.  Skin: No rashes or lesions.  Lymphatics: No cervical, supraclavicular, or inguinal adenopathy.  GU:  Normal external female genitalia. No lesions. No discharge or bleeding.             Bladder/urethra:  No lesions or masses, well supported bladder             Vagina: Well rugated.             Cervix: Normal appearing, no lesions.             Uterus: Small, minimally mobile, fixed anteriorly, no parametrial involvement or nodularity.             Adnexa: Fullness in right adnexa. .  Rectal: No involvement, free of uterus, no nodularity.  Endometrial biopsy procedure Preoperative diagnosis: Abnormal uterine bleeding Postoperative diagnosis: Same as above Physician: Berline Lopes MD Specimens: Endometrial biopsy Estimated blood loss: Minimal Procedure in detail: After the procedure was discussed with the patient and verbal consent was obtained, the patient was placed in dorsal lithotomy position, speculum was placed in the vagina and the cervix was well visualized.  Betadine was used x3 to cleanse the cervix.  A single-tooth tenaculum was placed on the anterior lip of the cervix.  An endometrial Pipelle was passed through the cervix to a depth of 7 cm at the uterine fundus.  1 pass was performed with sufficient tissue noted.  This was placed in formalin to be sent to pathology.  Hemostasis was assured.  All instruments were removed from the vagina.  Patient tolerated the procedure well.  LABORATORY AND RADIOLOGIC DATA:  Outside medical records were reviewed to synthesize the above history, along with the history and physical  obtained during the visit.    CBC    Component Value Date/Time   WBC 7.3 06/27/2021 0840   RBC 3.53 (L) 06/27/2021 0840   HGB 13.8 06/27/2021 0840   HCT 37.6 06/27/2021 0840   PLT 320 06/27/2021 0840   MCV 106.5 (H) 06/27/2021 0840   MCH 39.1 (H) 06/27/2021 0840   MCHC 36.7 (H) 06/27/2021 0840   RDW 12.6 06/27/2021 0840   LYMPHSABS 1.8 06/27/2021 0840   MONOABS 0.5 06/27/2021 0840   EOSABS 0.2 06/27/2021 0840   BASOSABS 0.0 06/27/2021 0840

## 2021-07-31 ENCOUNTER — Other Ambulatory Visit: Payer: Self-pay

## 2021-07-31 ENCOUNTER — Other Ambulatory Visit: Payer: Self-pay | Admitting: Gynecologic Oncology

## 2021-07-31 DIAGNOSIS — G8918 Other acute postprocedural pain: Secondary | ICD-10-CM

## 2021-07-31 MED ORDER — GABAPENTIN 100 MG PO CAPS
100.0000 mg | ORAL_CAPSULE | Freq: Three times a day (TID) | ORAL | 0 refills | Status: DC
  Filled 2021-07-31: qty 50, 17d supply, fill #0

## 2021-07-31 NOTE — Progress Notes (Signed)
Gabapentin sent in for post-op pain management per patient request. She has taken this before and tolerated well.

## 2021-07-31 NOTE — Progress Notes (Signed)
Patient here for new patient consultation with Dr. Jeral Pinch and for a pre-operative appointment prior to her scheduled surgery on August 16, 2021 (November 10 was originally discussed at the visit, but due to availability, the date was changed to October 27). She is scheduled for robotic assisted total laparoscopic hysterectomy, bilateral salpingectomy, possible unilateral vs bilateral oophorectomy, possible laparotomy, possible bowel surgery. The surgery was discussed in detail.  See after visit summary for additional details. Visual aids used to discuss items related to surgery including sequential compression stockings, foley catheter, IV pump, multi-modal pain regimen including tylenol, photo of the surgical robot, female reproductive system to discuss surgery in detail.      Discussed post-op pain management in detail including the aspects of the enhanced recovery pathway.  Advised her that a new prescription would be sent in for oxycodone and it is only to be used for after her upcoming surgery.  We discussed the use of tylenol post-op and to monitor for a maximum of 4,000 mg in a 24 hour period.  Also prescribed sennakot to be used after surgery and to hold if having loose stools.  Discussed bowel regimen in detail.     Discussed the use of heparin pre-op, SCDs, and measures to take at home to prevent DVT including frequent mobility.  Reportable signs and symptoms of DVT discussed. Post-operative instructions discussed and expectations for after surgery. Incisional care discussed as well including reportable signs and symptoms including erythema, drainage, wound separation.   Bowel prep discussed and medications prescribed including Golytely, neomycin, and erythromycin.    10 minutes spent with the patient.  Verbalizing understanding of material discussed. No needs or concerns voiced at the end of the visit.   Advised patient to call for any needs.  Advised that her post-operative medications  had been prescribed and could be picked up at any time.     This appointment is included in the global surgical bundle as pre-operative teaching and has no charge.

## 2021-08-01 LAB — SURGICAL PATHOLOGY

## 2021-08-01 NOTE — Patient Instructions (Addendum)
DUE TO COVID-19 ONLY ONE VISITOR IS ALLOWED TO COME WITH YOU AND STAY IN THE WAITING ROOM ONLY DURING PRE OP AND PROCEDURE DAY OF SURGERY.   Up to two visitors ages 16+ are allowed at one time in a patient's room.  The visitors may rotate out with other people throughout the day.  Additionally, up to two children between the ages of 42 and 19 are allowed and do not count toward the number of allowed visitors.  Children within this age range must be accompanied by an adult visitor.  One adult visitor may remain with the patient overnight and must be in the room by 8 PM.          Your procedure is scheduled on: 08-16-21   Report to [facility]  Entrance   Report to front desk  at      [place] AM     Call this number if you have problems the morning of surgery [phone]   Remember: Follow bowel prep per MD order   DRINK 2 PRESURGERY ENSURE [place] AT  1000 PM AND 1 PRESURGERY [place] 3 HOURS PRIOR TO [place]. NO SOLIDS AFTER MIDNIGHT THE DAY PRIOR TO THE SURGERY.   NOTHING BY MOUTH EXCEPT CLEAR LIQUIDS UNTIL THREE HOURS PRIOR TO SCHEDULED SURGERY. PLEASE FINISH PRESURGERY ENSURE DRINK PER SURGEON ORDER 3 HOURS PRIOR TO SCHEDULED SURGERY TIME WHICH NEEDS TO BE COMPLETED AT _0430 am  then nothing by mouth.     CLEAR LIQUID DIET                                                                    water Black Coffee and tea, regular and decaf No Creamer                            Plain Jell-O any favor except red or purple                                  Fruit ices (not with fruit pulp)                                      Iced Popsicles                                                                    Cranberry, grape and apple juices Sports drinks like Gatorade Lightly seasoned clear broth or consume(fat free) Sugar, honey syrup  _____________________________________________________________________     BRUSH YOUR  TEETH MORNING OF SURGERY AND RINSE YOUR MOUTH OUT, NO CHEWING GUM CANDY OR MINTS.     Take these medicines the morning of surgery with A SIP OF WATER:   DO NOT TAKE ANY DIABETIC MEDICATIONS DAY OF YOUR SURGERY  You may not have any metal on your body including hair pins and              piercings  Do not wear jewelry, make-up, lotions, powders,perfumes,        deodorant             Do not wear nail polish on your fingernails or toenails .  Do not shave  48 hours prior to surgery.              Do not bring valuables to the hospital. [place].  Contacts, dentures or bridgework may not be worn into surgery.  You may bring a small overnight bag with you     Patients discharged the day of surgery will not be allowed to drive home. IF YOU ARE HAVING SURGERY AND GOING HOME THE SAME DAY, YOU MUST HAVE AN ADULT TO DRIVE YOU HOME AND BE WITH YOU FOR 24 HOURS. YOU MAY GO HOME BY TAXI OR UBER OR ORTHERWISE, BUT AN ADULT MUST ACCOMPANY YOU HOME AND STAY WITH YOU FOR 24 HOURS.  Name and phone number of your driver:  Special Instructions: N/A              Please read over the following fact sheets you were given: _____________________________________________________________________             [facility] - Preparing for Surgery Before surgery, you can play an important role.  Because skin is not sterile, your skin needs to be as free of germs as possible.  You can reduce the number of germs on your skin by washing with CHG (chlorahexidine gluconate) soap before surgery.  CHG is an antiseptic cleaner which kills germs and bonds with the skin to continue killing germs even after washing. Please DO NOT use if you have an allergy to CHG or antibacterial soaps.  If your skin becomes reddened/irritated stop using the CHG and inform your nurse when you arrive at Short Stay. Do not shave (including legs and underarms) for at least  48 hours prior to the first CHG shower.  You may shave your face/neck. Please follow these instructions carefully:  1.  Shower with CHG Soap the night before surgery and the  morning of Surgery.  2.  If you choose to wash your hair, wash your hair first as usual with your  normal  shampoo.  3.  After you shampoo, rinse your hair and body thoroughly to remove the  shampoo.                           4.  Use CHG as you would any other liquid soap.  You can apply chg directly  to the skin and wash                       Gently with a scrungie or clean washcloth.  5.  Apply the CHG Soap to your body ONLY FROM THE NECK DOWN.   Do not use on face/ open                           Wound or open sores. Avoid contact with eyes, ears mouth and genitals (private parts).  Wash face,  Genitals (private parts) with your normal soap.             6.  Wash thoroughly, paying special attention to the area where your surgery  will be performed.  7.  Thoroughly rinse your body with warm water from the neck down.  8.  DO NOT shower/wash with your normal soap after using and rinsing off  the CHG Soap.                9.  Pat yourself dry with a clean towel.            10.  Wear clean pajamas.            11.  Place clean sheets on your bed the night of your first shower and do not  sleep with pets. Day of Surgery : Do not apply any lotions/deodorants the morning of surgery.  Please wear clean clothes to the hospital/surgery center.  FAILURE TO FOLLOW THESE INSTRUCTIONS MAY RESULT IN THE CANCELLATION OF YOUR SURGERY PATIENT SIGNATURE_________________________________  NURSE SIGNATURE__________________________________  ________________________________________________________________________

## 2021-08-01 NOTE — Progress Notes (Addendum)
PCP - Valarie Merino , NP Cardiologist - Lyman Bishop, MD video visit 06-29-21 epic  PPM/ICD -  Device Orders -  Rep Notified -   Chest x-ray -  EKG -  Stress Test -  ECHO -  Cardiac Cath -  Cardiac CT - 03-06-21 epic  Sleep Study -  CPAP -   Fasting Blood Sugar -  Checks Blood Sugar _____ times a day  Blood Thinner Instructions: Aspirin Instructions: 81 mg not needed to stop per MD  ERAS Protcol - PRE-SURGERY Ensure    COVID TEST- N/A COVID vaccine -  Activity--No SOB or Cp with activity Anesthesia review: On Contrave last dose 10/ 21, HTN, Asthma  Patient denies shortness of breath, fever, cough and chest pain at PAT appointment   All instructions explained to the patient, with a verbal understanding of the material. Patient agrees to go over the instructions while at home for a better understanding. Patient also instructed to self quarantine after being tested for COVID-19. The opportunity to ask questions was provided.

## 2021-08-02 ENCOUNTER — Other Ambulatory Visit: Payer: Self-pay

## 2021-08-03 ENCOUNTER — Other Ambulatory Visit: Payer: Self-pay

## 2021-08-03 DIAGNOSIS — Z23 Encounter for immunization: Secondary | ICD-10-CM | POA: Diagnosis not present

## 2021-08-06 ENCOUNTER — Encounter (HOSPITAL_COMMUNITY)
Admission: RE | Admit: 2021-08-06 | Discharge: 2021-08-06 | Disposition: A | Discharge status: Home / Self Care | Payer: 59 | Source: Ambulatory Visit | Attending: Gynecologic Oncology | Admitting: Gynecologic Oncology

## 2021-08-06 ENCOUNTER — Encounter (HOSPITAL_COMMUNITY): Payer: Self-pay

## 2021-08-06 ENCOUNTER — Other Ambulatory Visit: Payer: Self-pay

## 2021-08-06 DIAGNOSIS — N939 Abnormal uterine and vaginal bleeding, unspecified: Secondary | ICD-10-CM

## 2021-08-06 DIAGNOSIS — I83811 Varicose veins of right lower extremities with pain: Secondary | ICD-10-CM | POA: Diagnosis not present

## 2021-08-06 DIAGNOSIS — Z01818 Encounter for other preprocedural examination: Secondary | ICD-10-CM | POA: Insufficient documentation

## 2021-08-06 DIAGNOSIS — N809 Endometriosis, unspecified: Secondary | ICD-10-CM

## 2021-08-06 DIAGNOSIS — I8311 Varicose veins of right lower extremity with inflammation: Secondary | ICD-10-CM | POA: Diagnosis not present

## 2021-08-06 HISTORY — DX: Zoster without complications: B02.9

## 2021-08-06 LAB — URINALYSIS, ROUTINE W REFLEX MICROSCOPIC
Bilirubin Urine: NEGATIVE
Glucose, UA: NEGATIVE mg/dL
Hgb urine dipstick: NEGATIVE
Ketones, ur: NEGATIVE mg/dL
Leukocytes,Ua: NEGATIVE
Nitrite: NEGATIVE
Protein, ur: NEGATIVE mg/dL
Specific Gravity, Urine: 1.002 — ABNORMAL LOW (ref 1.005–1.030)
pH: 7 (ref 5.0–8.0)

## 2021-08-06 LAB — COMPREHENSIVE METABOLIC PANEL
ALT: 36 U/L (ref 0–44)
AST: 37 U/L (ref 15–41)
Albumin: 4.2 g/dL (ref 3.5–5.0)
Alkaline Phosphatase: 42 U/L (ref 38–126)
Anion gap: 9 (ref 5–15)
BUN: 17 mg/dL (ref 6–20)
CO2: 23 mmol/L (ref 22–32)
Calcium: 9.2 mg/dL (ref 8.9–10.3)
Chloride: 104 mmol/L (ref 98–111)
Creatinine, Ser: 0.57 mg/dL (ref 0.44–1.00)
GFR, Estimated: >60 mL/min (ref >60)
Glucose, Bld: 94 mg/dL (ref 70–99)
Potassium: 3.8 mmol/L (ref 3.5–5.1)
Sodium: 136 mmol/L (ref 135–145)
Total Bilirubin: 0.6 mg/dL (ref 0.3–1.2)
Total Protein: 7.6 g/dL (ref 6.5–8.1)

## 2021-08-06 LAB — CBC
HCT: 42.3 % (ref 36.0–46.0)
Hemoglobin: 14.8 g/dL (ref 12.0–15.0)
MCH: 38 pg — ABNORMAL HIGH (ref 26.0–34.0)
MCHC: 35 g/dL (ref 30.0–36.0)
MCV: 108.7 fL — ABNORMAL HIGH (ref 80.0–100.0)
Platelets: 381 10*3/uL (ref 150–400)
RBC: 3.89 MIL/uL (ref 3.87–5.11)
RDW: 12.7 % (ref 11.5–15.5)
WBC: 7.4 10*3/uL (ref 4.0–10.5)
nRBC: 0 % (ref 0.0–0.2)

## 2021-08-08 ENCOUNTER — Other Ambulatory Visit (HOSPITAL_COMMUNITY): Payer: Self-pay | Admitting: Internal Medicine

## 2021-08-08 ENCOUNTER — Inpatient Hospital Stay: Payer: 59 | Attending: Obstetrics and Gynecology | Admitting: Obstetrics and Gynecology

## 2021-08-08 ENCOUNTER — Other Ambulatory Visit: Payer: Self-pay

## 2021-08-08 ENCOUNTER — Other Ambulatory Visit: Payer: Self-pay | Admitting: Internal Medicine

## 2021-08-08 VITALS — BP 136/99 | HR 105 | Temp 97.8°F | Resp 20 | Wt 177.0 lb

## 2021-08-08 DIAGNOSIS — Z79890 Hormone replacement therapy (postmenopausal): Secondary | ICD-10-CM | POA: Diagnosis not present

## 2021-08-08 DIAGNOSIS — Z79899 Other long term (current) drug therapy: Secondary | ICD-10-CM | POA: Diagnosis not present

## 2021-08-08 DIAGNOSIS — N809 Endometriosis, unspecified: Secondary | ICD-10-CM | POA: Diagnosis not present

## 2021-08-08 DIAGNOSIS — N939 Abnormal uterine and vaginal bleeding, unspecified: Secondary | ICD-10-CM | POA: Diagnosis not present

## 2021-08-08 DIAGNOSIS — K219 Gastro-esophageal reflux disease without esophagitis: Secondary | ICD-10-CM | POA: Diagnosis not present

## 2021-08-08 DIAGNOSIS — N979 Female infertility, unspecified: Secondary | ICD-10-CM | POA: Insufficient documentation

## 2021-08-08 DIAGNOSIS — R102 Pelvic and perineal pain: Secondary | ICD-10-CM

## 2021-08-08 DIAGNOSIS — H209 Unspecified iridocyclitis: Secondary | ICD-10-CM | POA: Insufficient documentation

## 2021-08-08 DIAGNOSIS — J342 Deviated nasal septum: Secondary | ICD-10-CM | POA: Insufficient documentation

## 2021-08-08 DIAGNOSIS — E785 Hyperlipidemia, unspecified: Secondary | ICD-10-CM | POA: Insufficient documentation

## 2021-08-08 DIAGNOSIS — J4540 Moderate persistent asthma, uncomplicated: Secondary | ICD-10-CM | POA: Insufficient documentation

## 2021-08-08 NOTE — Progress Notes (Signed)
Gynecologic Oncology Consult Visit   Referring Provider: second opinios  Chief Concern: endometriosis, infertility, irregular menses - desiring surgery  Subjective:  Rachel Hooper is a 40 y.o. female who is seen in consultation for second opinion regarding endometriosis and pelvic pain and irregular bleeding.   Patient seen by Dr Berline Lopes at [facility].  See her note below.   Patient has a long history of endometriosis.  In 2016, in the setting of infertility as well as left lower quadrant pain and possible tubal dilation, she was taken to the operating room for diagnostic laparoscopy with findings of severe pelvic adhesions with obliteration of the anterior and posterior cul-de-sac. Later, in 04/2015, she was taken back to the OR with Dr. Kerin Perna for laparoscopic lysis of adhesions, enterolysis, left salpingectomy, excision of peritoneal endometriosis lesions, chromotubation, and endometrial biopsy.  Findings at the time of surgery were notable for normal upper abdominal survey, bladder densely adherent to the anterior uterus, complete obliteration of the posterior cul-de-sac.  In 02/2016, she underwent robotic enterolysis, lysis of adhesions, partial left salpingectomy, left ovarian cystectomy, chromotubation, excision and fulguration of endometriosis and endometrial biopsies.  Subsequently, she underwent 5 IVF attempts, none of which were successful.  She was asymptomatic at the time and was able to lose approximately 90 pounds doing high intensity exercise.  She has now gained approximately 20 to 25 pounds back.  After having her child (had a child using a surrogate), she developed pain, which she has struggled with since.   Patient is currently managed on Loestrin.  She has previously been on other hormonal medications for suppression and control of her symptoms.  She was on Lupron previously when she was undergoing IVF cycles and had difficult symptomatology and decreased  quality of life.  Even on continuous hormonal suppression, she will have breakthrough bleeding.  She has been on the Loestrin now for 2 or 3 years.  She notes that in the last few years, she has had an increase in the irregularity of bleeding.  She now has light to typical menstrual flow for about a week before and during the week that she is taking sugar pills.  Bleeding is always enough to cause her to wear a panty liner.   With regard to her pain, she describes it as intermittent.  She has pain when her bladder fills with urine.  In her 62s, she was diagnosed with interstitial cystitis and continued on potassiums citrate, which she still takes.  She thinks more likely that this is related to her endometriosis.  She has pelvic pain especially after exercising.  This has caused her to limit her exercise, which is impacted her quality of life and weight gain.  She also has pain along her sides and sometimes up by her diaphragm.  She describes this as burning and bloating, worse when she is bleeding.  She also describes having no interest in sexual intercourse.  She intermittently has pain with bowel movements.  Dr Berline Lopes did an endometrial biopsy that was negative 10/22.    Reports having a good appetite without nausea or emesis.  Baseline bowel function alternates between diarrhea and constipation.   Patient lives in [place] with her partner and 70-year-old child.  She denies any tobacco or alcohol use.  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   Hyperlipidemia 07/06/2021   Toxic effect of venom of bees, unintentional 05/06/2018   Herpes zoster with ophthalmic complication 63/10/6008   Moderate persistent asthma, uncomplicated 93/23/5573   Allergic rhinitis due to pollen 03/04/2016   Insect sting allergy, current  reaction 03/04/2016   Gastroesophageal reflux disease without esophagitis 03/04/2016   Deviated nasal septum 03/04/2016   Hymenoptera allergy 07/01/2015    Past Medical History: Past Medical History:  Diagnosis Date   Asthma    Endometriosis    GERD (gastroesophageal reflux disease)    History of kidney stones    Hydrosalpinx    left   Hyperlipidemia    Repatha   Hypertension    PAI-1 4G/4G genotype    PCOS (polycystic ovarian syndrome)    PONV (postoperative nausea and vomiting)    severe   Seasonal allergies    Shingles     Past Surgical History: Past Surgical History:  Procedure Laterality Date   ANTERIOR CRUCIATE LIGAMENT REPAIR Right 1995   CHROMOPERTUBATION N/A 05/03/2015   Procedure: CHROMOPERTUBATION;  Surgeon: Governor Specking, MD;  Location: [place];  Service: Gynecology;  Laterality: N/A;   EXTRACORPOREAL SHOCK WAVE LITHOTRIPSY  2008   LAPAROSCOPY N/A 03/28/2015   Procedure: LAPAROSCOPY DIAGNOSTIC;  Surgeon: Brien Few, MD;  Location: [facility];  Service: Gynecology;  Laterality: N/A;   LAPAROSCOPY N/A 05/03/2015   Procedure: LAPAROSCOPY, EXCISION ENDOMETRIOSIS, ENTERO LYSIS, WITH ENDOMETRIAL BIOPSY;  Surgeon: Governor Specking, MD;  Location: [place];  Service: Gynecology;  Laterality: N/A;   NASAL SEPTOPLASTY W/ TURBINOPLASTY Bilateral 06/20/2017   Procedure: NASAL SEPTOPLASTY WITH BILATERAL TURBINATE REDUCTION;  Surgeon: Melida Quitter, MD;  Location: [place];  Service: ENT;  Laterality: Bilateral;   UNILATERAL SALPINGECTOMY Left 05/03/2015   Procedure: LEFT SALPINGECTOMY;  Surgeon: Governor Specking, MD;  Location: [place];  Service: Gynecology;  Laterality: Left;   WISDOM TOOTH EXTRACTION       OB History:  OB History  Gravida Para Term Preterm AB Living  5 0       1  SAB IAB Ectopic Multiple Live Births               # Outcome Date GA Lbr Len/2nd Weight Sex Delivery Anes PTL Lv  5 Grav[place]           4  Gravida           3 Grav[place]           2 [place]           1 [place]             Obstetric Comments  Has a child through surrogate pregnancy    Family History: Family History  Problem Relation Age of Onset   Allergic rhinitis Mother    Asthma Mother    Eczema Mother    Stroke Father    COPD Maternal Grandmother    Colon cancer Maternal Grandmother    Eczema Maternal Grandfather    Dementia Maternal Grandfather    COPD Maternal Grandfather    Stroke Paternal Grandmother    Breast cancer Paternal Grandmother    Angioedema Neg Hx    Immunodeficiency Neg Hx    Urticaria Neg Hx     Social History: Social History   Socioeconomic History   Marital status: Married    Spouse name: Not on file   Number of children: 1   Years of education: Not on file  Highest education level: Not on file  Occupational History   Occupation: nurse practitioner    Employer: [employer]  Tobacco Use   Smoking status: Never   Smokeless tobacco: Never  Vaping Use   Vaping Use: Never used  Substance and Sexual Activity   Alcohol use: No   Drug use: No   Sexual activity: Yes    Partners: Male    Birth control/protection: Pill  Other Topics Concern   Not on file  Social History Narrative   Exercise daily   Social Determinants of Health   Financial Resource Strain: Not on file  Food Insecurity: Not on file  Transportation Needs: Not on file  Physical Activity: Not on file  Stress: Not on file  Social Connections: Not on file  Intimate Partner Violence: Not on file    Allergies: Allergies  Allergen Reactions   Bee Venom Anaphylaxis    And wasp venom   Pneumococcal Vaccines Anaphylaxis   Atorvastatin     Fatigue, Brain Fog, Muscle Pain   Mixed Vespid Venom    Rosuvastatin     Fatigue, Brain Fog, Muscle Pain   Wasp Venom     Current Medications: Current Outpatient Medications  Medication Sig Dispense Refill   albuterol (VENTOLIN HFA) 108 (90 Base) MCG/ACT inhaler INHALE 2  PUFFS BY MOUTH EVERY 6 HOURS AS NEEDED FOR WHEEZING OR SHORTNESS OF BREATH. (Patient taking differently: Inhale 2 puffs into the lungs every 6 (six) hours as needed for wheezing or shortness of breath.) 18 g 1   aspirin 81 MG tablet Take 81 mg by mouth daily.     azaTHIOprine (IMURAN) 50 MG tablet TAKE 1 TABLET BY MOUTH 3 TIMES DAILY (Patient taking differently: Take 50 mg by mouth 3 (three) times daily.) 270 tablet 2   budesonide (PULMICORT FLEXHALER) 180 MCG/ACT inhaler INHALE TWO PUFFS BY MOUTH INTO THE LUNGS TWICE A DAY TO PREVENT COUGH OR WHEEZE. RINSE, GARGLE AND SPIT AFTER USE. (Patient taking differently: Inhale 2 puffs into the lungs 2 (two) times daily as needed (to prevent cough or wheeze).) 3 each 3   budesonide (RHINOCORT ALLERGY) 32 MCG/ACT nasal spray ONE SPRAY EACH NOSTRIL TWICE A DAY FOR NASAL CONGESTION OR DRAINAGE. (Patient taking differently: Place 1 spray into both nostrils 2 (two) times daily.) 8.6 g 5   cetirizine (ZYRTEC) 10 MG tablet Take 1 tablet (10 mg total) by mouth at bedtime. 30 tablet 5   EPINEPHrine 0.3 mg/0.3 mL IJ SOAJ injection INJECT 0.3 MLS (0.3 MG TOTAL) INTO THE MUSCLE ONCE FOR 1 DOSE. USE AS DIRECTED FOR SEVERE ALLERGIC REACTION. 4 each 0   [START ON 08/29/2021] erythromycin base (E-MYCIN) 500 MG tablet Take two tablets at 2 pm, 3 pm, and 10 pm the day before surgery 6 tablet 0   Evolocumab (REPATHA SURECLICK) 734 MG/ML SOAJ Inject 1 Dose into the skin every 14 (fourteen) days. 2 mL 11   [START ON 08/17/2021] gabapentin (NEURONTIN) 100 MG capsule Take 1 capsule (100 mg total) by mouth 3 (three) times daily. For AFTER surgery, do not drive if drowsiness occurs 50 capsule 0   ibuprofen (ADVIL) 800 MG tablet Take 1 tablet (800 mg total) by mouth every 8 (eight) hours as needed for moderate pain. For AFTER surgery only 30 tablet 0   LARIN 1.5/30 1.5-30 MG-MCG tablet Take 1 tablet by mouth daily.     Loteprednol Etabonate (LOTEMAX SM) 0.38 % GEL Place 1 drop into the  right eye in the morning and  at bedtime. (Patient taking differently: Place 1 drop into the right eye daily.) 5 g 11   magnesium oxide-pyridoxine (BEELITH) 362-20 MG TABS Take 1 tablet by mouth every evening.      metoprolol succinate (TOPROL-XL) 25 MG 24 hr tablet Take 12.5 mg by mouth daily.     montelukast (SINGULAIR) 10 MG tablet TAKE 1 TABLET (10 MG TOTAL) BY MOUTH AT BEDTIME. (Patient taking differently: Take 10 mg by mouth at bedtime.) 90 tablet 5   Naltrexone-buPROPion HCl ER (CONTRAVE) 8-90 MG TB12 Take 2 tablets by mouth twice a day for 90 days. 360 tablet 0   [START ON 08/29/2021] neomycin (MYCIFRADIN) 500 MG tablet Take two tablets at 2 pm, 3 pm, and 10 pm the day before surgery 6 tablet 0   oxyCODONE (OXY IR/ROXICODONE) 5 MG immediate release tablet Take 1 tablet (5 mg total) by mouth every 4 (four) hours as needed for severe pain. For AFTER surgery only, do not take and drive 15 tablet 0   [START ON 08/29/2021] polyethylene glycol (GOLYTELY) 236 g solution Take 2,000 mLs by mouth once for 1 dose. Drink 1/2 jug the day before surgery. 4000 mL 0   potassium citrate (UROCIT-K) 10 MEQ (1080 MG) SR tablet TAKE 1 TABLET BY MOUTH 2 TIMES DAILY WITH MEALS. (Patient taking differently: Take 10 mEq by mouth 2 (two) times daily with a meal.) 180 tablet 1   [START ON 08/31/2021] senna-docusate (SENOKOT-S) 8.6-50 MG tablet Take 2 tablets by mouth at bedtime. For AFTER surgery, do not take if having diarrhea 30 tablet 0   timolol (TIMOPTIC) 0.5 % ophthalmic solution PLACE 1 DROP INTO THE RIGHT EYE DAILY. (Patient taking differently: Place 1 drop into the right eye daily.) 5 mL 11   valACYclovir (VALTREX) 1000 MG tablet Take 1 tablet (1,000 mg total) by mouth daily. 90 tablet 11   Current Facility-Administered Medications  Medication Dose Route Frequency Provider Last Rate Last Admin   diphenhydrAMINE (BENADRYL) injection 50 mg  50 mg Intramuscular Once PRN Ann Held, DO        Review of  Systems General: negative for, fevers, chills, fatigue, changes in sleep, changes in weight or appetite Skin: negative for changes in color, texture, moles or lesions Eyes: negative for, changes in vision, pain, diplopia HEENT: negative for, change in hearing, pain, discharge, tinnitus, vertigo, voice changes, sore throat, neck masses Pulmonary: negative for, dyspnea, orthopnea, productive cough Cardiac: negative for, palpitations, syncope, pain, discomfort, pressure Gastrointestinal: negative for, dysphagia, nausea, vomiting, jaundice, pain, constipation, diarrhea, hematemesis, hematochezia Genitourinary/Sexual: negative for, dysuria, discharge, hesitancy, nocturia, retention, stones, infections, STD's, incontinence Musculoskeletal: negative for, pain, stiffness, swelling, range of motion limitation Hematology: negative for, easy bruising, bleeding Neurologic/Psych: negative for, headaches, seizures, paralysis, weakness, tremor, change in gait, change in sensation, mood swings, depression, anxiety, change in memory  Objective:  Physical Examination:  BP (!) 136/99   Pulse (!) 105   Temp 97.8 F (36.6 C)   Resp 20   Wt 177 lb (80.3 kg)   SpO2 100%   BMI 29.45 kg/m    ECOG Performance Status: 1 - Symptomatic but completely ambulatory  General appearance: alert, cooperative, and appears stated age HEENT:PERRLA, neck supple with midline trachea, and thyroid without masses Lymph node survey: non-palpable, axillary, inguinal, supraclavicular Cardiovascular: regular rate and rhythm, no murmurs or gallops Respiratory: normal air entry, lungs clear to auscultation and no rales, rhonchi or wheezing Abdomen: soft, non-tender, without masses or organomegaly, protuberant, nontender, no masses  palpated, no hernias, and well healed incision Back: inspection of back is normal Extremities: extremities normal, atraumatic, no cyanosis or edema Skin exam - normal coloration and turgor, no rashes, no  suspicious skin lesions noted. Neurological exam reveals alert, oriented, normal speech, no focal findings or movement disorder noted.  Pelvic: exam chaperoned by nurse;  Vulva: normal appearing vulva with no masses, tenderness or lesions; Vagina: normal vagina; Adnexa: normal adnexa in size, nontender and no masses; Uterus: uterus is normal size, shape, consistency and nontender; Cervix: anteverted; Rectal: normal rectal, no masses  Lab Review Labs on site today: none  Radiologic Imaging: none Bladder and kidney US 6/22 reported small ovarian cyst.    Assessment:  JADYNN EPPING is a 40 y.o. P0 female diagnosed with chronic pelvic pain, irregular menses, infertility and known endometriosis treated previously with surgery and hormonal therapy.  Does not desire fertility at this point.  Per Dr Berline Lopes "In 2016, in the setting of infertility as well as left lower quadrant pain and possible tubal dilation, she was taken to the operating room for diagnostic laparoscopy with findings of severe pelvic adhesions with obliteration of the anterior and posterior cul-de-sac. Later, in 04/2015, she was taken back to the OR with Dr. Kerin Perna for laparoscopic lysis of adhesions, enterolysis, left salpingectomy, excision of peritoneal endometriosis lesions, chromotubation, and endometrial biopsy.  Findings at the time of surgery were notable for normal upper abdominal survey, bladder densely adherent to the anterior uterus, complete obliteration of the posterior cul-de-sac.  In 02/2016, she underwent robotic enterolysis, lysis of adhesions, partial left salpingectomy, left ovarian cystectomy, chromotubation, excision and fulguration of endometriosis and endometrial biopsies.  Subsequently, she underwent 5 IVF attempts, none of which were successful."     Plan:   Problem List Items Addressed This Visit       Genitourinary   Abnormal uterine bleeding - Primary     Other   Endometriosis    We discussed  options for management of endometriosis.  I think she may well have adenomyosis as well.  At this point she is resigned to having surgery and had seen Dr Berline Lopes in [place] who recommended hysterectomy with possible retention of one ovary if completely normal.  Patient prefers removal of both ovaries if there is severe osis with subsequent ERT.    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    I suggested that she have a CA125 today and a formal radiology ultrasound.  We will then see her at [facility] for operative planning in the near future.  Discussed that laparoscopic surgery may be possible. But in view of findings of dense endometriosis at prior surgery, could require open surgery with rectosigmoid resection.   The patient's diagnosis, an outline of the further diagnostic and laboratory studies which will be required, the recommendation, and alternatives were discussed.  All questions were answered to the patient's satisfaction.a total of 60 minutes were spent with the patient/family today; 40% was spent in education, counseling and coordination of care for endometriosis  Mellody Drown, MD  CC:  Brien Few, [address],  [place] [address] [phone]

## 2021-08-09 ENCOUNTER — Inpatient Hospital Stay: Payer: 59

## 2021-08-09 ENCOUNTER — Other Ambulatory Visit: Payer: Self-pay | Admitting: Gynecologic Oncology

## 2021-08-09 ENCOUNTER — Other Ambulatory Visit: Payer: Self-pay

## 2021-08-09 ENCOUNTER — Other Ambulatory Visit: Payer: Self-pay | Admitting: Allergy

## 2021-08-09 DIAGNOSIS — N939 Abnormal uterine and vaginal bleeding, unspecified: Secondary | ICD-10-CM | POA: Diagnosis not present

## 2021-08-09 DIAGNOSIS — N809 Endometriosis, unspecified: Secondary | ICD-10-CM

## 2021-08-09 DIAGNOSIS — N979 Female infertility, unspecified: Secondary | ICD-10-CM | POA: Diagnosis not present

## 2021-08-09 DIAGNOSIS — E785 Hyperlipidemia, unspecified: Secondary | ICD-10-CM | POA: Diagnosis not present

## 2021-08-09 DIAGNOSIS — R102 Pelvic and perineal pain: Secondary | ICD-10-CM | POA: Diagnosis not present

## 2021-08-09 DIAGNOSIS — J4540 Moderate persistent asthma, uncomplicated: Secondary | ICD-10-CM | POA: Diagnosis not present

## 2021-08-09 DIAGNOSIS — Z79890 Hormone replacement therapy (postmenopausal): Secondary | ICD-10-CM | POA: Diagnosis not present

## 2021-08-09 DIAGNOSIS — H209 Unspecified iridocyclitis: Secondary | ICD-10-CM | POA: Diagnosis not present

## 2021-08-09 DIAGNOSIS — K219 Gastro-esophageal reflux disease without esophagitis: Secondary | ICD-10-CM | POA: Diagnosis not present

## 2021-08-09 MED FILL — Timolol Maleate Ophth Soln 0.5%: OPHTHALMIC | 25 days supply | Qty: 5 | Fill #1 | Status: AC

## 2021-08-10 ENCOUNTER — Telehealth: Payer: Self-pay | Admitting: Allergy

## 2021-08-10 ENCOUNTER — Other Ambulatory Visit: Payer: Self-pay

## 2021-08-10 LAB — CA 125: Cancer Antigen (CA) 125: 42.8 U/mL — ABNORMAL HIGH (ref 0.0–38.1)

## 2021-08-10 MED ORDER — NORETHINDRONE ACET-ETHINYL EST 1.5-30 MG-MCG PO TABS
ORAL_TABLET | ORAL | 0 refills | Status: DC
  Filled 2021-08-10: qty 21, 21d supply, fill #0

## 2021-08-10 MED ORDER — MONTELUKAST SODIUM 10 MG PO TABS
ORAL_TABLET | Freq: Every day | ORAL | 0 refills | Status: DC
  Filled 2021-08-10: qty 90, 90d supply, fill #0

## 2021-08-10 MED ORDER — POTASSIUM CITRATE ER 10 MEQ (1080 MG) PO TBCR
EXTENDED_RELEASE_TABLET | ORAL | 1 refills | Status: DC
  Filled 2021-08-10 – 2021-10-17 (×2): qty 180, 90d supply, fill #0

## 2021-08-10 NOTE — Telephone Encounter (Signed)
Patient is requesting a refill on prescription Rx #: [id number]  montelukast (SINGULAIR) 10 MG tablet [[id number]] patient has been made aware an asthma patient must be seen every six months an appointment has been made with Dr. Ernst Bowler on 08-14-2021 Patient asking for a cursty refill please advise

## 2021-08-10 NOTE — Telephone Encounter (Signed)
Refill sent in and patient was informed

## 2021-08-13 ENCOUNTER — Other Ambulatory Visit: Payer: Self-pay

## 2021-08-13 MED ORDER — POTASSIUM CITRATE ER 10 MEQ (1080 MG) PO TBCR
EXTENDED_RELEASE_TABLET | ORAL | 2 refills | Status: DC
  Filled 2021-08-13: qty 120, 30d supply, fill #0
  Filled 2021-08-13 – 2022-08-02 (×3): qty 360, 90d supply, fill #0

## 2021-08-14 ENCOUNTER — Ambulatory Visit: Payer: 59 | Admitting: Allergy & Immunology

## 2021-08-14 ENCOUNTER — Other Ambulatory Visit: Payer: Self-pay

## 2021-08-15 ENCOUNTER — Telehealth: Payer: Self-pay

## 2021-08-15 ENCOUNTER — Encounter: Payer: Self-pay | Admitting: Gynecologic Oncology

## 2021-08-15 ENCOUNTER — Other Ambulatory Visit: Payer: Self-pay

## 2021-08-15 ENCOUNTER — Inpatient Hospital Stay (HOSPITAL_BASED_OUTPATIENT_CLINIC_OR_DEPARTMENT_OTHER): Payer: 59 | Admitting: Gynecologic Oncology

## 2021-08-15 DIAGNOSIS — N809 Endometriosis, unspecified: Secondary | ICD-10-CM | POA: Diagnosis not present

## 2021-08-15 DIAGNOSIS — N939 Abnormal uterine and vaginal bleeding, unspecified: Secondary | ICD-10-CM

## 2021-08-15 NOTE — Telephone Encounter (Signed)
Telephone call to check on pre-operative status.  Patient compliant with pre-operative instructions. Patient has started her bowel prep, instructions reviewed and questions answered.  No questions or concerns voiced.  Instructed to call for any needs.

## 2021-08-15 NOTE — Telephone Encounter (Signed)
Left message requesting return call. Checking on pre-operative status.

## 2021-08-15 NOTE — Progress Notes (Signed)
Gynecologic Oncology Telehealth Consult Note: Gyn-Onc  I connected with [name] on 08/15/21 at  4:35 PM EDT by telephone and verified that I am speaking with the correct person using two identifiers.  I discussed the limitations, risks, security and privacy concerns of performing an evaluation and management service by telemedicine and the availability of in-person appointments. I also discussed with the patient that there may be a patient responsible charge related to this service. The patient expressed understanding and agreed to proceed.  Other persons participating in the visit and their role in the encounter: none.  Patient's location: home   Provider's location: [facility]  Reason for Visit: discussion and review of plan prior to surgery  Interval History: Doing bowel prep. Denies other complaints.   Past Medical/Surgical History: Past Medical History:  Diagnosis Date   Asthma    Endometriosis    GERD (gastroesophageal reflux disease)    History of kidney stones    Hydrosalpinx    left   Hyperlipidemia    Repatha   Hypertension    PAI-1 4G/4G genotype    PCOS (polycystic ovarian syndrome)    PONV (postoperative nausea and vomiting)    severe   Seasonal allergies    Shingles     Past Surgical History:  Procedure Laterality Date   ANTERIOR CRUCIATE LIGAMENT REPAIR Right 1995   CHROMOPERTUBATION N/A 05/03/2015   Procedure: CHROMOPERTUBATION;  Surgeon: Governor Specking, MD;  Location: [place];  Service: Gynecology;  Laterality: N/A;   EXTRACORPOREAL SHOCK WAVE LITHOTRIPSY  2008   LAPAROSCOPY N/A 03/28/2015   Procedure: LAPAROSCOPY DIAGNOSTIC;  Surgeon: Brien Few, MD;  Location: [facility];  Service: Gynecology;  Laterality: N/A;   LAPAROSCOPY N/A 05/03/2015   Procedure: LAPAROSCOPY, EXCISION ENDOMETRIOSIS, ENTERO LYSIS, WITH ENDOMETRIAL BIOPSY;  Surgeon: Governor Specking, MD;  Location: [place];  Service: Gynecology;  Laterality:  N/A;   NASAL SEPTOPLASTY W/ TURBINOPLASTY Bilateral 06/20/2017   Procedure: NASAL SEPTOPLASTY WITH BILATERAL TURBINATE REDUCTION;  Surgeon: Melida Quitter, MD;  Location: [place];  Service: ENT;  Laterality: Bilateral;   UNILATERAL SALPINGECTOMY Left 05/03/2015   Procedure: LEFT SALPINGECTOMY;  Surgeon: Governor Specking, MD;  Location: [place];  Service: Gynecology;  Laterality: Left;   WISDOM TOOTH EXTRACTION      Family History  Problem Relation Age of Onset   Allergic rhinitis Mother    Asthma Mother    Eczema Mother    Stroke Father    COPD Maternal Grandmother    Colon cancer Maternal Grandmother    Eczema Maternal Grandfather    Dementia Maternal Grandfather    COPD Maternal Grandfather    Stroke Paternal Grandmother    Breast cancer Paternal Grandmother    Angioedema Neg Hx    Immunodeficiency Neg Hx    Urticaria Neg Hx     Social History   Socioeconomic History   Marital status: Married    Spouse name: Not on file   Number of children: 1   Years of education: Not on file   Highest education level: Not on file  Occupational History   Occupation: Designer, jewellery    Employer: [employer]  Tobacco Use   Smoking status: Never   Smokeless tobacco: Never  Vaping Use   Vaping Use: Never used  Substance and Sexual Activity   Alcohol use: No   Drug use: No   Sexual activity: Yes    Partners: Male    Birth control/protection: Pill  Other Topics  Concern   Not on file  Social History Narrative   Exercise daily   Social Determinants of Health   Financial Resource Strain: Not on file  Food Insecurity: Not on file  Transportation Needs: Not on file  Physical Activity: Not on file  Stress: Not on file  Social Connections: Not on file    Current Medications:  Current Outpatient Medications:    albuterol (VENTOLIN HFA) 108 (90 Base) MCG/ACT inhaler, INHALE 2 PUFFS BY MOUTH EVERY 6 HOURS AS NEEDED FOR WHEEZING OR SHORTNESS OF BREATH. (Patient  taking differently: Inhale 2 puffs into the lungs every 6 (six) hours as needed for wheezing or shortness of breath.), Disp: 18 g, Rfl: 1   aspirin 81 MG tablet, Take 81 mg by mouth daily., Disp: , Rfl:    azaTHIOprine (IMURAN) 50 MG tablet, TAKE 1 TABLET BY MOUTH 3 TIMES DAILY (Patient taking differently: Take 50 mg by mouth 3 (three) times daily.), Disp: 270 tablet, Rfl: 2   budesonide (PULMICORT FLEXHALER) 180 MCG/ACT inhaler, INHALE TWO PUFFS BY MOUTH INTO THE LUNGS TWICE A DAY TO PREVENT COUGH OR WHEEZE. RINSE, GARGLE AND SPIT AFTER USE. (Patient taking differently: Inhale 2 puffs into the lungs 2 (two) times daily as needed (to prevent cough or wheeze).), Disp: 3 each, Rfl: 3   budesonide (RHINOCORT ALLERGY) 32 MCG/ACT nasal spray, ONE SPRAY EACH NOSTRIL TWICE A DAY FOR NASAL CONGESTION OR DRAINAGE. (Patient taking differently: Place 1 spray into both nostrils 2 (two) times daily.), Disp: 8.6 g, Rfl: 5   cetirizine (ZYRTEC) 10 MG tablet, Take 1 tablet (10 mg total) by mouth at bedtime., Disp: 30 tablet, Rfl: 5   EPINEPHrine 0.3 mg/0.3 mL IJ SOAJ injection, INJECT 0.3 MLS (0.3 MG TOTAL) INTO THE MUSCLE ONCE FOR 1 DOSE. USE AS DIRECTED FOR SEVERE ALLERGIC REACTION., Disp: 4 each, Rfl: 0   [START ON 08/29/2021] erythromycin base (E-MYCIN) 500 MG tablet, Take two tablets at 2 pm, 3 pm, and 10 pm the day before surgery, Disp: 6 tablet, Rfl: 0   Evolocumab (REPATHA SURECLICK) 710 MG/ML SOAJ, Inject 1 Dose into the skin every 14 (fourteen) days., Disp: 2 mL, Rfl: 11   [START ON 08/17/2021] gabapentin (NEURONTIN) 100 MG capsule, Take 1 capsule (100 mg total) by mouth 3 (three) times daily. For AFTER surgery, do not drive if drowsiness occurs, Disp: 50 capsule, Rfl: 0   ibuprofen (ADVIL) 800 MG tablet, Take 1 tablet (800 mg total) by mouth every 8 (eight) hours as needed for moderate pain. For AFTER surgery only, Disp: 30 tablet, Rfl: 0   LARIN 1.5/30 1.5-30 MG-MCG tablet, Take 1 tablet by mouth daily., Disp:  , Rfl:    Loteprednol Etabonate (LOTEMAX SM) 0.38 % GEL, Place 1 drop into the right eye in the morning and at bedtime. (Patient taking differently: Place 1 drop into the right eye daily.), Disp: 5 g, Rfl: 11   magnesium oxide-pyridoxine (BEELITH) 362-20 MG TABS, Take 1 tablet by mouth every evening. , Disp: , Rfl:    metoprolol succinate (TOPROL-XL) 25 MG 24 hr tablet, Take 12.5 mg by mouth daily., Disp: , Rfl:    montelukast (SINGULAIR) 10 MG tablet, TAKE 1 TABLET (10 MG TOTAL) BY MOUTH AT BEDTIME., Disp: 90 tablet, Rfl: 0   Naltrexone-buPROPion HCl ER (CONTRAVE) 8-90 MG TB12, Take 2 tablets by mouth twice a day for 90 days., Disp: 360 tablet, Rfl: 0   [START ON 08/29/2021] neomycin (MYCIFRADIN) 500 MG tablet, Take two tablets at 2 pm,  3 pm, and 10 pm the day before surgery, Disp: 6 tablet, Rfl: 0   Norethindrone Acetate-Ethinyl Estradiol (LARIN 1.5/30) 1.5-30 MG-MCG tablet, TAKE 1 TABLET BY MOUTH DAILY (CONTINUOUS ACTIVE PILLS), Disp: 21 tablet, Rfl: 0   oxyCODONE (OXY IR/ROXICODONE) 5 MG immediate release tablet, Take 1 tablet (5 mg total) by mouth every 4 (four) hours as needed for severe pain. For AFTER surgery only, do not take and drive, Disp: 15 tablet, Rfl: 0   [START ON 08/29/2021] polyethylene glycol (GOLYTELY) 236 g solution, Take 2,000 mLs by mouth once for 1 dose. Drink 1/2 jug the day before surgery., Disp: 4000 mL, Rfl: 0   potassium citrate (UROCIT-K) 10 MEQ (1080 MG) SR tablet, TAKE 1 TABLET BY MOUTH 2 TIMES DAILY WITH MEALS., Disp: 180 tablet, Rfl: 1   potassium citrate (UROCIT-K) 10 MEQ (1080 MG) SR tablet, Take 2 tablets (20 mEq total) by mouth 2 times daily. Take two tablets by mouth in am and pm, Disp: 120 tablet, Rfl: 2   [START ON 08/31/2021] senna-docusate (SENOKOT-S) 8.6-50 MG tablet, Take 2 tablets by mouth at bedtime. For AFTER surgery, do not take if having diarrhea, Disp: 30 tablet, Rfl: 0   timolol (TIMOPTIC) 0.5 % ophthalmic solution, PLACE 1 DROP INTO THE RIGHT EYE  DAILY. (Patient taking differently: Place 1 drop into the right eye daily.), Disp: 5 mL, Rfl: 11   valACYclovir (VALTREX) 1000 MG tablet, Take 1 tablet (1,000 mg total) by mouth daily., Disp: 90 tablet, Rfl: 11  Current Facility-Administered Medications:    diphenhydrAMINE (BENADRYL) injection 50 mg, 50 mg, Intramuscular, Once PRN, Ann Held, DO  Physical Exam: There were no vitals taken for this visit. Deferred given phone visit limitations  Laboratory & Radiologic Studies: None new  Assessment & Plan: Rachel Hooper is a 40 y.o. woman with advanced endometriosis undergoing definitive surgery tomorrow.  Discussed patient's concerns about postoperative periods. Reviewed plan for multimodal pain therapy. All questions answered.   I discussed the assessment and treatment plan with the patient. The patient was provided with an opportunity to ask questions and all were answered. The patient agreed with the plan and demonstrated an understanding of the instructions.   The patient was advised to call back or see an in-person evaluation if the symptoms worsen or if the condition fails to improve as anticipated.   26 minutes of total time was spent for this patient encounter, including preparation, over the phone counseling with the patient and coordination of care, and documentation of the encounter.   Jeral Pinch, MD  Division of Gynecologic Oncology  Department of Obstetrics and Gynecology  [facility]

## 2021-08-15 NOTE — Anesthesia Preprocedure Evaluation (Addendum)
Anesthesia Evaluation  Patient identified by MRN, date of birth, ID band Patient awake    Reviewed: Allergy & Precautions, NPO status , Patient's Chart, lab work & pertinent test results  History of Anesthesia Complications (+) PONV  Airway Mallampati: II  TM Distance: >3 FB Neck ROM: Full    Dental  (+) Teeth Intact   Pulmonary asthma ,  COPD inhaler,    Pulmonary exam normal        Cardiovascular hypertension, Pt. on medications and Pt. on home beta blockers  Rhythm:Regular Rate:Normal     Neuro/Psych negative neurological ROS  negative psych ROS   GI/Hepatic Neg liver ROS, GERD  ,  Endo/Other  negative endocrine ROS  Renal/GU negative Renal ROS  Female GU complaint Endometriosis, AUB    Musculoskeletal negative musculoskeletal ROS (+)   Abdominal (+)  Abdomen: soft.    Peds  Hematology negative hematology ROS (+)   Anesthesia Other Findings   Reproductive/Obstetrics                            Anesthesia Physical Anesthesia Plan  ASA: 2  Anesthesia Plan: General   Post-op Pain Management:    Induction: Intravenous  PONV Risk Score and Plan: 4 or greater and Ondansetron, Aprepitant, Dexamethasone, Midazolam, Scopolamine patch - Pre-op, Amisulpride, Treatment may vary due to age or medical condition, TIVA and Propofol infusion  Airway Management Planned: Mask and Oral ETT  Additional Equipment: None  Intra-op Plan:   Post-operative Plan: Extubation in OR  Informed Consent: I have reviewed the patients History and Physical, chart, labs and discussed the procedure including the risks, benefits and alternatives for the proposed anesthesia with the patient or authorized representative who has indicated his/her understanding and acceptance.     Dental advisory given  Plan Discussed with: CRNA  Anesthesia Plan Comments: (Lab Results      Component                Value                Date                      WBC                      7.4                 08/06/2021                HGB                      14.8                08/06/2021                HCT                      42.3                08/06/2021                MCV                      108.7 (H)           08/06/2021                PLT  381                 08/06/2021           Lab Results      Component                Value               Date                      NA                       136                 08/06/2021                K                        3.8                 08/06/2021                CO2                      23                  08/06/2021                GLUCOSE                  94                  08/06/2021                BUN                      17                  08/06/2021                CREATININE               0.57                08/06/2021                CALCIUM                  9.2                 08/06/2021                GFRNONAA                 >60                 08/06/2021          )       Anesthesia Quick Evaluation

## 2021-08-16 ENCOUNTER — Encounter (HOSPITAL_COMMUNITY)
Admission: RE | Disposition: A | Discharge status: Home / Self Care | Payer: Self-pay | Source: Ambulatory Visit | Attending: Gynecologic Oncology

## 2021-08-16 ENCOUNTER — Ambulatory Visit (HOSPITAL_COMMUNITY): Payer: 59 | Admitting: Anesthesiology

## 2021-08-16 ENCOUNTER — Ambulatory Visit: Payer: 59

## 2021-08-16 ENCOUNTER — Encounter (HOSPITAL_COMMUNITY): Payer: Self-pay | Admitting: Gynecologic Oncology

## 2021-08-16 ENCOUNTER — Ambulatory Visit (HOSPITAL_COMMUNITY)
Admission: RE | Admit: 2021-08-16 | Discharge: 2021-08-16 | Disposition: A | Discharge status: Home / Self Care | Payer: 59 | Source: Ambulatory Visit | Attending: Gynecologic Oncology | Admitting: Gynecologic Oncology

## 2021-08-16 DIAGNOSIS — K219 Gastro-esophageal reflux disease without esophagitis: Secondary | ICD-10-CM | POA: Diagnosis not present

## 2021-08-16 DIAGNOSIS — Z87442 Personal history of urinary calculi: Secondary | ICD-10-CM | POA: Insufficient documentation

## 2021-08-16 DIAGNOSIS — E785 Hyperlipidemia, unspecified: Secondary | ICD-10-CM | POA: Diagnosis not present

## 2021-08-16 DIAGNOSIS — J449 Chronic obstructive pulmonary disease, unspecified: Secondary | ICD-10-CM | POA: Diagnosis not present

## 2021-08-16 DIAGNOSIS — Z79624 Long term (current) use of inhibitors of nucleotide synthesis: Secondary | ICD-10-CM | POA: Insufficient documentation

## 2021-08-16 DIAGNOSIS — Z91030 Bee allergy status: Secondary | ICD-10-CM | POA: Insufficient documentation

## 2021-08-16 DIAGNOSIS — Z793 Long term (current) use of hormonal contraceptives: Secondary | ICD-10-CM | POA: Insufficient documentation

## 2021-08-16 DIAGNOSIS — N83291 Other ovarian cyst, right side: Secondary | ICD-10-CM | POA: Diagnosis not present

## 2021-08-16 DIAGNOSIS — N83202 Unspecified ovarian cyst, left side: Secondary | ICD-10-CM | POA: Diagnosis not present

## 2021-08-16 DIAGNOSIS — Z888 Allergy status to other drugs, medicaments and biological substances status: Secondary | ICD-10-CM | POA: Insufficient documentation

## 2021-08-16 DIAGNOSIS — N8003 Adenomyosis of the uterus: Secondary | ICD-10-CM | POA: Diagnosis not present

## 2021-08-16 DIAGNOSIS — N939 Abnormal uterine and vaginal bleeding, unspecified: Secondary | ICD-10-CM

## 2021-08-16 DIAGNOSIS — N809 Endometriosis, unspecified: Secondary | ICD-10-CM

## 2021-08-16 DIAGNOSIS — D259 Leiomyoma of uterus, unspecified: Secondary | ICD-10-CM | POA: Diagnosis not present

## 2021-08-16 DIAGNOSIS — Z7951 Long term (current) use of inhaled steroids: Secondary | ICD-10-CM | POA: Insufficient documentation

## 2021-08-16 DIAGNOSIS — Z7982 Long term (current) use of aspirin: Secondary | ICD-10-CM | POA: Insufficient documentation

## 2021-08-16 DIAGNOSIS — N83201 Unspecified ovarian cyst, right side: Secondary | ICD-10-CM | POA: Diagnosis not present

## 2021-08-16 DIAGNOSIS — I10 Essential (primary) hypertension: Secondary | ICD-10-CM | POA: Diagnosis not present

## 2021-08-16 HISTORY — PX: ROBOTIC ASSISTED TOTAL HYSTERECTOMY: SHX6085

## 2021-08-16 LAB — TYPE AND SCREEN
ABO/RH(D): O POS
Antibody Screen: NEGATIVE

## 2021-08-16 LAB — PREGNANCY, URINE: Preg Test, Ur: NEGATIVE

## 2021-08-16 LAB — ABO/RH: ABO/RH(D): O POS

## 2021-08-16 SURGERY — HYSTERECTOMY, TOTAL, ROBOT-ASSISTED
Anesthesia: General | Site: Abdomen

## 2021-08-16 MED ORDER — ONDANSETRON HCL 4 MG/2ML IJ SOLN
INTRAMUSCULAR | Status: DC | PRN
  Administered 2021-08-16: 4 mg via INTRAVENOUS

## 2021-08-16 MED ORDER — SCOPOLAMINE 1 MG/3DAYS TD PT72
1.0000 | MEDICATED_PATCH | TRANSDERMAL | Status: DC
  Administered 2021-08-16: 1.5 mg via TRANSDERMAL
  Filled 2021-08-16: qty 1

## 2021-08-16 MED ORDER — SUCCINYLCHOLINE CHLORIDE 200 MG/10ML IV SOSY
PREFILLED_SYRINGE | INTRAVENOUS | Status: AC
  Filled 2021-08-16: qty 10

## 2021-08-16 MED ORDER — ACETAMINOPHEN 500 MG PO TABS
1000.0000 mg | ORAL_TABLET | ORAL | Status: DC
  Filled 2021-08-16: qty 2

## 2021-08-16 MED ORDER — LIDOCAINE HCL (PF) 2 % IJ SOLN
INTRAMUSCULAR | Status: AC
  Filled 2021-08-16: qty 5

## 2021-08-16 MED ORDER — FENTANYL CITRATE (PF) 250 MCG/5ML IJ SOLN
INTRAMUSCULAR | Status: DC | PRN
  Administered 2021-08-16 (×2): 50 ug via INTRAVENOUS
  Administered 2021-08-16: 100 ug via INTRAVENOUS

## 2021-08-16 MED ORDER — OXYCODONE HCL 5 MG PO TABS
5.0000 mg | ORAL_TABLET | ORAL | Status: DC | PRN
  Administered 2021-08-16: 10 mg via ORAL

## 2021-08-16 MED ORDER — ORAL CARE MOUTH RINSE: 15.0000 mL | Freq: Once | OROMUCOSAL | Status: AC

## 2021-08-16 MED ORDER — SODIUM CHLORIDE (PF) 0.9 % IJ SOLN
INTRAMUSCULAR | Status: AC
  Filled 2021-08-16: qty 10

## 2021-08-16 MED ORDER — AMISULPRIDE (ANTIEMETIC) 5 MG/2ML IV SOLN
10.0000 mg | Freq: Once | INTRAVENOUS | Status: DC | PRN
Start: 2021-08-16 — End: 2021-08-16

## 2021-08-16 MED ORDER — DEXAMETHASONE SODIUM PHOSPHATE 4 MG/ML IJ SOLN: 4.0000 mg | INTRAMUSCULAR | Status: DC

## 2021-08-16 MED ORDER — FENTANYL CITRATE PF 50 MCG/ML IJ SOSY
PREFILLED_SYRINGE | INTRAMUSCULAR | Status: AC
  Administered 2021-08-16: 25 ug via INTRAVENOUS
  Filled 2021-08-16: qty 1

## 2021-08-16 MED ORDER — ROCURONIUM BROMIDE 100 MG/10ML IV SOLN
INTRAVENOUS | Status: DC | PRN
Start: 2021-08-16 — End: 2021-08-16
  Administered 2021-08-16: 10 mg via INTRAVENOUS
  Administered 2021-08-16: 60 mg via INTRAVENOUS
  Administered 2021-08-16 (×2): 20 mg via INTRAVENOUS

## 2021-08-16 MED ORDER — PROPOFOL 1000 MG/100ML IV EMUL
INTRAVENOUS | Status: AC
  Filled 2021-08-16: qty 100

## 2021-08-16 MED ORDER — GABAPENTIN 300 MG PO CAPS
300.0000 mg | ORAL_CAPSULE | ORAL | Status: DC
  Filled 2021-08-16: qty 1

## 2021-08-16 MED ORDER — PHENYLEPHRINE 40 MCG/ML (10ML) SYRINGE FOR IV PUSH (FOR BLOOD PRESSURE SUPPORT)
PREFILLED_SYRINGE | INTRAVENOUS | Status: DC | PRN
  Administered 2021-08-16: 80 ug via INTRAVENOUS
  Administered 2021-08-16: 40 ug via INTRAVENOUS
  Administered 2021-08-16: 80 ug via INTRAVENOUS

## 2021-08-16 MED ORDER — SUGAMMADEX SODIUM 200 MG/2ML IV SOLN
INTRAVENOUS | Status: DC | PRN
Start: 2021-08-16 — End: 2021-08-16
  Administered 2021-08-16: 200 mg via INTRAVENOUS

## 2021-08-16 MED ORDER — LACTATED RINGERS IV SOLN: INTRAVENOUS | Status: DC

## 2021-08-16 MED ORDER — CEFAZOLIN SODIUM-DEXTROSE 2-4 GM/100ML-% IV SOLN
2.0000 g | INTRAVENOUS | Status: AC
  Administered 2021-08-16: 2 g via INTRAVENOUS
  Filled 2021-08-16: qty 100

## 2021-08-16 MED ORDER — DEXAMETHASONE SODIUM PHOSPHATE 10 MG/ML IJ SOLN
INTRAMUSCULAR | Status: DC | PRN
  Administered 2021-08-16: 10 mg via INTRAVENOUS

## 2021-08-16 MED ORDER — ROCURONIUM BROMIDE 10 MG/ML (PF) SYRINGE
PREFILLED_SYRINGE | INTRAVENOUS | Status: AC
  Filled 2021-08-16: qty 10

## 2021-08-16 MED ORDER — PROPOFOL 500 MG/50ML IV EMUL
INTRAVENOUS | Status: DC | PRN
  Administered 2021-08-16: 150 ug/kg/min via INTRAVENOUS

## 2021-08-16 MED ORDER — LIDOCAINE 2% (20 MG/ML) 5 ML SYRINGE
INTRAMUSCULAR | Status: DC | PRN
Start: 2021-08-16 — End: 2021-08-16
  Administered 2021-08-16: 60 mg via INTRAVENOUS

## 2021-08-16 MED ORDER — ACETAMINOPHEN 10 MG/ML IV SOLN
INTRAVENOUS | Status: DC | PRN
  Administered 2021-08-16: 1000 mg via INTRAVENOUS

## 2021-08-16 MED ORDER — BUPIVACAINE HCL 0.25 % IJ SOLN
INTRAMUSCULAR | Status: AC
  Filled 2021-08-16: qty 1

## 2021-08-16 MED ORDER — PROPOFOL 10 MG/ML IV BOLUS
INTRAVENOUS | Status: DC | PRN
  Administered 2021-08-16: 160 mg via INTRAVENOUS

## 2021-08-16 MED ORDER — FENTANYL CITRATE PF 50 MCG/ML IJ SOSY
25.0000 ug | PREFILLED_SYRINGE | INTRAMUSCULAR | Status: DC | PRN
  Administered 2021-08-16: 50 ug via INTRAVENOUS
  Administered 2021-08-16: 25 ug via INTRAVENOUS

## 2021-08-16 MED ORDER — MIDAZOLAM HCL 2 MG/2ML IJ SOLN
INTRAMUSCULAR | Status: DC | PRN
  Administered 2021-08-16: 2 mg via INTRAVENOUS

## 2021-08-16 MED ORDER — FENTANYL CITRATE (PF) 250 MCG/5ML IJ SOLN
INTRAMUSCULAR | Status: AC
  Filled 2021-08-16: qty 5

## 2021-08-16 MED ORDER — OXYCODONE HCL 5 MG PO TABS
ORAL_TABLET | ORAL | Status: AC
  Filled 2021-08-16: qty 2

## 2021-08-16 MED ORDER — CHLORHEXIDINE GLUCONATE 0.12 % MT SOLN
15.0000 mL | Freq: Once | OROMUCOSAL | Status: AC
  Administered 2021-08-16: 15 mL via OROMUCOSAL

## 2021-08-16 MED ORDER — ONDANSETRON HCL 4 MG/2ML IJ SOLN
INTRAMUSCULAR | Status: AC
  Filled 2021-08-16: qty 2

## 2021-08-16 MED ORDER — ENSURE PRE-SURGERY PO LIQD
296.0000 mL | Freq: Once | ORAL | Status: DC
  Filled 2021-08-16: qty 296

## 2021-08-16 MED ORDER — KETOROLAC TROMETHAMINE 30 MG/ML IJ SOLN
INTRAMUSCULAR | Status: AC
  Filled 2021-08-16: qty 1

## 2021-08-16 MED ORDER — PHENYLEPHRINE 40 MCG/ML (10ML) SYRINGE FOR IV PUSH (FOR BLOOD PRESSURE SUPPORT)
PREFILLED_SYRINGE | INTRAVENOUS | Status: AC
  Filled 2021-08-16: qty 10

## 2021-08-16 MED ORDER — ENSURE PRE-SURGERY PO LIQD
592.0000 mL | Freq: Once | ORAL | Status: DC
  Filled 2021-08-16: qty 592

## 2021-08-16 MED ORDER — PHENYLEPHRINE HCL (PRESSORS) 10 MG/ML IV SOLN: INTRAVENOUS | Status: DC | PRN

## 2021-08-16 MED ORDER — KETOROLAC TROMETHAMINE 15 MG/ML IJ SOLN
INTRAMUSCULAR | Status: DC | PRN
  Administered 2021-08-16: 15 mg via INTRAVENOUS

## 2021-08-16 MED ORDER — SCOPOLAMINE 1 MG/3DAYS TD PT72: 1.0000 | MEDICATED_PATCH | TRANSDERMAL | Status: DC

## 2021-08-16 MED ORDER — FENTANYL CITRATE PF 50 MCG/ML IJ SOSY
PREFILLED_SYRINGE | INTRAMUSCULAR | Status: AC
  Filled 2021-08-16: qty 1

## 2021-08-16 MED ORDER — PROPOFOL 10 MG/ML IV BOLUS
INTRAVENOUS | Status: AC
  Filled 2021-08-16: qty 20

## 2021-08-16 MED ORDER — APREPITANT 40 MG PO CAPS
40.0000 mg | ORAL_CAPSULE | Freq: Once | ORAL | Status: AC
  Administered 2021-08-16: 40 mg via ORAL
  Filled 2021-08-16: qty 1

## 2021-08-16 MED ORDER — CELECOXIB 200 MG PO CAPS
200.0000 mg | ORAL_CAPSULE | ORAL | Status: DC
  Filled 2021-08-16: qty 1

## 2021-08-16 MED ORDER — BUPIVACAINE HCL 0.25 % IJ SOLN
INTRAMUSCULAR | Status: DC | PRN
  Administered 2021-08-16: 35 mL

## 2021-08-16 MED ORDER — MIDAZOLAM HCL 2 MG/2ML IJ SOLN
INTRAMUSCULAR | Status: AC
  Filled 2021-08-16: qty 2

## 2021-08-16 MED ORDER — ACETAMINOPHEN 10 MG/ML IV SOLN: 1000.0000 mg | Freq: Once | INTRAVENOUS | Status: DC | PRN

## 2021-08-16 MED ORDER — SODIUM CHLORIDE 0.9 % IR SOLN
Status: DC | PRN
  Administered 2021-08-16: 1

## 2021-08-16 MED ORDER — LACTATED RINGERS IV SOLN: INTRAVENOUS | Status: DC | PRN

## 2021-08-16 MED ORDER — DEXAMETHASONE SODIUM PHOSPHATE 10 MG/ML IJ SOLN
INTRAMUSCULAR | Status: AC
  Filled 2021-08-16: qty 1

## 2021-08-16 MED ORDER — HEPARIN SODIUM (PORCINE) 5000 UNIT/ML IJ SOLN
5000.0000 [IU] | INTRAMUSCULAR | Status: AC
  Administered 2021-08-16: 5000 [IU] via SUBCUTANEOUS
  Filled 2021-08-16: qty 1

## 2021-08-16 MED ORDER — STERILE WATER FOR INJECTION IJ SOLN
INTRAMUSCULAR | Status: AC
  Filled 2021-08-16: qty 10

## 2021-08-16 MED ORDER — ACETAMINOPHEN 10 MG/ML IV SOLN
INTRAVENOUS | Status: AC
  Filled 2021-08-16: qty 100

## 2021-08-16 SURGICAL SUPPLY — 51 items
ADH SKN CLS APL DERMABOND .7 (GAUZE/BANDAGES/DRESSINGS) ×2
APL SRG 38 LTWT LNG FL B (MISCELLANEOUS) ×2
APPLICATOR ARISTA FLEXITIP XL (MISCELLANEOUS) ×2 IMPLANT
CATH URET ROBINSON 24FR STRL (CATHETERS) ×2 IMPLANT
COVER BACK TABLE 60X90IN (DRAPES) ×3 IMPLANT
COVER TIP SHEARS 8 DVNC (MISCELLANEOUS) ×2 IMPLANT
COVER TIP SHEARS 8MM DA VINCI (MISCELLANEOUS) ×3
DECANTER SPIKE VIAL GLASS SM (MISCELLANEOUS) ×2 IMPLANT
DERMABOND ADVANCED (GAUZE/BANDAGES/DRESSINGS) ×1
DERMABOND ADVANCED .7 DNX12 (GAUZE/BANDAGES/DRESSINGS) ×2 IMPLANT
DRAPE ARM DVNC X/XI (DISPOSABLE) ×8 IMPLANT
DRAPE COLUMN DVNC XI (DISPOSABLE) ×2 IMPLANT
DRAPE DA VINCI XI ARM (DISPOSABLE) ×12
DRAPE DA VINCI XI COLUMN (DISPOSABLE) ×3
DRAPE SHEET LG 3/4 BI-LAMINATE (DRAPES) ×3 IMPLANT
DRAPE SURG IRRIG POUCH 19X23 (DRAPES) ×3 IMPLANT
ELECT REM PT RETURN 15FT ADLT (MISCELLANEOUS) ×3 IMPLANT
GAUZE 4X4 16PLY ~~LOC~~+RFID DBL (SPONGE) ×3 IMPLANT
GLOVE SURG ENC MOIS LTX SZ6 (GLOVE) ×12 IMPLANT
GLOVE SURG ENC MOIS LTX SZ6.5 (GLOVE) ×6 IMPLANT
GOWN STRL REUS W/ TWL LRG LVL3 (GOWN DISPOSABLE) ×8 IMPLANT
GOWN STRL REUS W/TWL LRG LVL3 (GOWN DISPOSABLE) ×12
HEMOSTAT ARISTA ABSORB 3G PWDR (HEMOSTASIS) ×2 IMPLANT
IRRIG SUCT STRYKERFLOW 2 WTIP (MISCELLANEOUS) ×3
IRRIGATION SUCT STRKRFLW 2 WTP (MISCELLANEOUS) ×2 IMPLANT
KIT TURNOVER KIT A (KITS) ×2 IMPLANT
MANIPULATOR UTERINE 4.5 ZUMI (MISCELLANEOUS) ×3 IMPLANT
NDL HYPO 21X1.5 SAFETY (NEEDLE) ×1 IMPLANT
NEEDLE HYPO 21X1.5 SAFETY (NEEDLE) ×3 IMPLANT
OBTURATOR OPTICAL STANDARD 8MM (TROCAR) ×3
OBTURATOR OPTICAL STND 8 DVNC (TROCAR) ×2
OBTURATOR OPTICALSTD 8 DVNC (TROCAR) ×2 IMPLANT
PACK ROBOT GYN CUSTOM WL (TRAY / TRAY PROCEDURE) ×3 IMPLANT
PAD POSITIONING PINK XL (MISCELLANEOUS) ×3 IMPLANT
PENCIL BUTTON HOLSTER BLD 10FT (ELECTRODE) ×2 IMPLANT
PORT ACCESS TROCAR AIRSEAL 12 (TROCAR) ×2 IMPLANT
PORT ACCESS TROCAR AIRSEAL 5M (TROCAR) ×1
SCRUB EXIDINE 4% CHG 4OZ (MISCELLANEOUS) ×3 IMPLANT
SEAL CANN UNIV 5-8 DVNC XI (MISCELLANEOUS) ×8 IMPLANT
SEAL XI 5MM-8MM UNIVERSAL (MISCELLANEOUS) ×12
SET TRI-LUMEN FLTR TB AIRSEAL (TUBING) ×3 IMPLANT
SUT MNCRL AB 4-0 PS2 18 (SUTURE) IMPLANT
SUT VIC AB 0 CT1 27 (SUTURE) ×3
SUT VIC AB 0 CT1 27XBRD ANTBC (SUTURE) ×1 IMPLANT
SUT VIC AB 4-0 PS2 18 (SUTURE) ×6 IMPLANT
SYR BULB IRRIG 60ML STRL (SYRINGE) ×2 IMPLANT
TOWEL OR NON WOVEN STRL DISP B (DISPOSABLE) ×3 IMPLANT
TRAY FOLEY MTR SLVR 16FR STAT (SET/KITS/TRAYS/PACK) ×3 IMPLANT
TROCAR XCEL NON-BLD 5MMX100MML (ENDOMECHANICALS) ×2 IMPLANT
UNDERPAD 30X36 HEAVY ABSORB (UNDERPADS AND DIAPERS) ×3 IMPLANT
WATER STERILE IRR 500ML POUR (IV SOLUTION) ×2 IMPLANT

## 2021-08-16 NOTE — Discharge Instructions (Signed)
°  Medications:  °- Take ibuprofen and tylenol first line for pain control. Take these regularly (every 6 hours) to decrease the build up of pain. ° °- If necessary, for severe pain not relieved by ibuprofen, take tramadol. ° °- While taking tramadol you should take sennakot every night to reduce the likelihood of constipation. If this causes diarrhea, stop its use. ° °Diet: °1. Low sodium Heart Healthy Diet is recommended. ° °2. It is safe to use a laxative if you have difficulty moving your bowels.  ° °Wound Care: °1. Keep clean and dry.  Shower daily. ° °Reasons to call the Doctor: ° °Fever - Oral temperature greater than 100.4 degrees Fahrenheit °Foul-smelling vaginal discharge °Difficulty urinating °Nausea and vomiting °Increased pain at the site of the incision that is unrelieved with pain medicine. °Difficulty breathing with or without chest pain °New calf pain especially if only on one side °Sudden, continuing increased vaginal bleeding with or without clots. °  °Follow-up: °1. See Rachel Hooper in 3 weeks. You will have a phone visit once pathology is back. ° °Contacts: °For questions or concerns you should contact: ° °Dr. Katherine Hooper at [phone] °After hours and on week-ends call [phone] and ask to speak to the physician on call for Gynecologic Oncology  °

## 2021-08-16 NOTE — Interval H&P Note (Signed)
History and Physical Interval Note:  08/16/2021 6:16 AM  Rachel Hooper  has presented today for surgery, with the diagnosis of ENDOMETRIOSIS; ABNORMAL UTERINE BLEEDING.  The various methods of treatment have been discussed with the patient and family. After consideration of risks, benefits and other options for treatment, the patient has consented to  Procedure(s): XI ROBOTIC ASSISTED TOTAL HYSTERECTOMY; POSSIBLE UNILATERAL VS. BILATERAL OOPHERECTOMY;SALPINGECTOMY/POSSIBLE LAPAROTOMY (N/A) BOWEL RESECTION (N/A) as a surgical intervention.  The patient's history has been reviewed, patient examined, no change in status, stable for surgery.  I have reviewed the patient's chart and labs.  Questions were answered to the patient's satisfaction.     [name]

## 2021-08-16 NOTE — Transfer of Care (Signed)
Immediate Anesthesia Transfer of Care Note  Patient: [name]  Procedure(s) Performed: XI ROBOTIC ASSISTED TOTAL HYSTERECTOMY; RIGHT SALPINGO-OOPHERECTOMY (Abdomen)  Patient Location: PACU  Anesthesia Type:General  Level of Consciousness: drowsy  Airway & Oxygen Therapy: Patient Spontanous Breathing and Patient connected to face mask oxygen  Post-op Assessment: Report given to RN and Post -op Vital signs reviewed and stable  Post vital signs: Reviewed and stable  Last Vitals:  Vitals Value Taken Time  BP 107/73 08/16/21 1033  Temp    Pulse 82 08/16/21 1035  Resp 21 08/16/21 1035  SpO2 100 % 08/16/21 1035  Vitals shown include unvalidated device data.  Last Pain:  Vitals:   08/16/21 0636  TempSrc:   PainSc: 0-No pain      Patients Stated Pain Goal: 3 (16/10/96 0454)  Complications: No notable events documented.

## 2021-08-16 NOTE — Anesthesia Procedure Notes (Signed)
Procedure Name: Intubation Date/Time: 08/16/2021 7:49 AM Performed by: Sharlette Dense, CRNA Patient Re-evaluated:Patient Re-evaluated prior to induction Oxygen Delivery Method: Circle system utilized Preoxygenation: Pre-oxygenation with 100% oxygen Induction Type: IV induction Ventilation: Mask ventilation without difficulty and Oral airway inserted - appropriate to patient size Laryngoscope Size: [name] Heck and 2 Grade View: Grade I Tube type: Oral Tube size: 7.5 mm Number of attempts: 1 Airway Equipment and Method: Stylet Placement Confirmation: ETT inserted through vocal cords under direct vision, positive ETCO2 and breath sounds checked- equal and bilateral Secured at: 22 cm Tube secured with: Tape Dental Injury: Teeth and Oropharynx as per pre-operative assessment

## 2021-08-16 NOTE — Brief Op Note (Signed)
08/16/2021  10:24 AM  PATIENT:  Rachel Hooper  40 y.o. female  PRE-OPERATIVE DIAGNOSIS:  ENDOMETRIOSIS; ABNORMAL UTERINE BLEEDING  POST-OPERATIVE DIAGNOSIS:  ENDOMETRIOSIS; ABNORMAL UTERINE BLEEDING  PROCEDURE:  Procedure(s): XI ROBOTIC ASSISTED TOTAL HYSTERECTOMY; RIGHT SALPINGO-OOPHERECTOMY (N/A)  SURGEON:  Surgeon(s) and Role:    [name], MD - Primary    * Lahoma Crocker, MD - Assisting   ANESTHESIA:   local and general  EBL:  150 mL   BLOOD ADMINISTERED:none  DRAINS: none   LOCAL MEDICATIONS USED:  MARCAINE     SPECIMEN:  uterus, cervix, right adnexa, left tube portion  DISPOSITION OF SPECIMEN:  PATHOLOGY  COUNTS:  YES  TOURNIQUET:  * No tourniquets in log *  DICTATION: .Note written in EPIC  PLAN OF CARE: Discharge to home after PACU  PATIENT DISPOSITION:  PACU - hemodynamically stable.   Delay start of Pharmacological VTE agent (>24hrs) due to surgical blood loss or risk of bleeding: not applicable

## 2021-08-16 NOTE — Op Note (Signed)
OPERATIVE NOTE  Pre-operative Diagnosis: Endometriosis  Post-operative Diagnosis: same, stage IV endometriosis. Outside of complete obliteration of tissue planes posteriorly, no significant findings of endometriosis within the pelvis.  Operation: Robotic-assisted laparoscopic total hysterectomy with bilateral salpingectomy, right oophorectomy, right ureterolysis, enterolysis for approximately 30 minutes  Surgeon: Jeral Pinch MD  Assistant Surgeon: Lahoma Crocker MD (an MD assistant was necessary for tissue manipulation, management of robotic instrumentation, retraction and positioning due to the complexity of the case and hospital policies).   Anesthesia: GET  Urine Output: 400cc  Operative Findings:  On EUA, 8cm moderately mobile uterus with fullness in the cul de sac (not mobile) and some fixation of the rectum. ON intra-abdominal entry, normal appearing upper abdomen, omentum, small and large abdomen. Pelvic sidewalls and anterior cul de sac normal appearing. Bilateral ovaries with simple appearing cysts, 4cm, kissing in the midline. Right fallopian tube identified. Portion of what appeared to be left fallopian tube also identified, no fimbriae appreciated.  Uterus 8cm and bulbous. Ovaries adherent to posterior uterus. Posterior cul de sac completely obliterated. Rectum densely adherent to posterior uterus/cervix. NO powder burn lesions or other pelvic findings of endometriosis (outside of bilateral cysts/endometriomas, obliteration of tissue planes in posterior cul de sac). Bubble test performed at the end of surgery negative for leak.   Estimated Blood Loss:  150cc      Total IV Fluids: see I&O flowsheet         Specimens: uterus, cervix, bilateral tubes, right ovary         Complications:  None apparent; patient tolerated the procedure well.         Disposition: PACU - hemodynamically stable.  Procedure Details  The patient was seen in the Holding Room. The risks,  benefits, complications, treatment options, and expected outcomes were discussed with the patient.  The patient concurred with the proposed plan, giving informed consent.  The site of surgery properly noted/marked. The patient was identified as Rachel Hooper and the procedure verified as a Robotic-assisted hysterectomy with at least USO.   After induction of anesthesia, the patient was draped and prepped in the usual sterile manner. Patient was placed in supine position after anesthesia and draped and prepped in the usual sterile manner as follows: Her arms were tucked to her side with all appropriate precautions.  The shoulders were stabilized with padded shoulder blocks applied to the acromium processes.  The patient was placed in the semi-lithotomy position in [place].  The perineum and vagina were prepped with CholoraPrep. The patient was draped after the CholoraPrep had been allowed to dry for 3 minutes.  A Time Out was held and the above information confirmed.  The urethra was prepped with Betadine. Foley catheter was placed.  A sterile speculum was placed in the vagina.  The cervix was grasped with a single-tooth tenaculum. The cervix was dilated with Kennon Rounds dilators.  The ZUMI uterine manipulator with a medium colpotomizer ring was placed without difficulty.  A pneum occluder balloon was placed over the manipulator.  OG tube placement was confirmed and to suction.   Next, a 10 mm skin incision was made 1 cm below the subcostal margin in the midclavicular line.  The 5 mm Optiview port and scope was used for direct entry.  Opening pressure was under 10 mm CO2.  The abdomen was insufflated and the findings were noted as above.   At this point and all points during the procedure, the patient's intra-abdominal pressure did not exceed 15 mmHg.  Next, an 8 mm skin incision was made superior to the umbilicus and a right and left port were placed about 8 cm lateral to the robot port on the right and left  side.  A fourth arm was placed on the right.  The 5 mm assist trocar was exchanged for a 10-12 mm port. All ports were placed under direct visualization.  The patient was placed in steep Trendelenburg.  Bowel was folded away into the upper abdomen.  The robot was docked in the normal manner.  The right and left peritoneum were opened parallel to the IP ligament to open the retroperitoneal spaces bilaterally. The round ligaments were transected. The ureter was noted to be on the medial leaf of the broad ligament.  The peritoneum above the ureter was incised and stretched. Given bilateral simple appearing cysts and patient's desire to have right ovary removed, the right infundibulopelvic ligament was skeletonized, cauterized and cut. On the left, the utero-ovarian ligament was identified, cauterized and cut. The ovary was bluntly and sharply separated from the posterior uterus until it was freed. During mobilization of both ovaries, cysts were rupture with drainage of clear fluid.   Attention was then turned posteriorly. The rectum was noted to be adherent to the posterior cervix and lower uterus. With traction on the rectum and the uterus anteverted, sharp dissection was used to create a plane between the uterus and the colon mesentery. There was an area that appeared concerning for direct rectal attachment to the posterior cervix. After sharply delineating a plane on either side of this area, the path of the rectum was clearly seen inferiorly. The area previously identified was thought ultimately to be a thick adhesion from the rectum to the cervix. This was sharply cut adjacent to the cervix, dropping the adhesion and the rectum below the cervix.   On the right, given adhesion of the adnexa to the medial leaf of the broad ligament, ureterolysis was performed until just past the level of the uterine artery. The uterine artery was isolated superior to the ureter and cauterized and transected. With the ureter in  view, the adnexa was further mobilized superior to it until adnexa was freed. The posterior peritoneum was taken to the level of the KOH right on the right.   The anterior peritoneum was also taken down.  The bladder flap was created to the level of the KOH ring.  The uterine artery on the right side was skeletonized, cauterized and cut in the normal manner.  A similar procedure was performed on the left.  The colpotomy was made and the uterus, cervix, and right adnexa were amputated and delivered through the vagina.  Pedicles were inspected and excellent hemostasis was achieved.    The colpotomy at the vaginal cuff was closed with Vicryl on a CT1 needle in a running manner.  Irrigation was used and excellent hemostasis was achieved.  The patient was then taken out of Trendelenburg and pelvis was filled with sterile fluid. Air was injected into the rectum to perform a bubble test which was negative for evidence of leak. The patient was replaced in Trendelenburg and the pelvis noted to be hemostasis. A small, approximately 1 cm hematoma in the sigmoid mesentery was noted, had not increased in size. Given raw surfaces of the cul de sac and bowel mesentery, Arista was placed over surgical bed.   At this point in the procedure was completed.  Robotic instruments were removed under direct visulaization.  The robot was undocked.  The deep subcutaneous tissue at the 10-12 mm port was closed with 0 Vicryl on a UR-5 needle.  The subcuticular tissue was closed with 4-0 Vicryl and the skin was closed with 4-0 Monocryl in a subcuticular manner.  Dermabond was applied.    The vagina was swabbed with minimal bleeding noted.  Foley catheter removed. All sponge, lap and needle counts were correct x  3.   The patient was transferred to the recovery room in stable condition.  Jeral Pinch, MD

## 2021-08-16 NOTE — Anesthesia Postprocedure Evaluation (Signed)
Anesthesia Post Note  Patient: Rachel Hooper  Procedure(s) Performed: XI ROBOTIC ASSISTED TOTAL HYSTERECTOMY; RIGHT SALPINGO-OOPHERECTOMY (Abdomen)     Patient location during evaluation: PACU Anesthesia Type: General Level of consciousness: awake and alert Pain management: pain level controlled Vital Signs Assessment: post-procedure vital signs reviewed and stable Respiratory status: spontaneous breathing, nonlabored ventilation, respiratory function stable and patient connected to nasal cannula oxygen Cardiovascular status: blood pressure returned to baseline and stable Postop Assessment: no apparent nausea or vomiting Anesthetic complications: no   No notable events documented.  Last Vitals:  Vitals:   08/16/21 1235 08/16/21 1330  BP: (!) 129/91 135/85  Pulse: 93 92  Resp:    Temp: 36.5 C   SpO2: 100% 100%    Last Pain:  Vitals:   08/16/21 1345  TempSrc:   PainSc: 3                  Alexandria Current P Markevius Trombetta

## 2021-08-17 ENCOUNTER — Telehealth: Payer: Self-pay

## 2021-08-17 ENCOUNTER — Encounter (HOSPITAL_COMMUNITY): Payer: Self-pay | Admitting: Gynecologic Oncology

## 2021-08-17 ENCOUNTER — Other Ambulatory Visit (HOSPITAL_COMMUNITY): Payer: Self-pay

## 2021-08-17 ENCOUNTER — Other Ambulatory Visit: Payer: Self-pay | Admitting: Gynecologic Oncology

## 2021-08-17 DIAGNOSIS — N939 Abnormal uterine and vaginal bleeding, unspecified: Secondary | ICD-10-CM

## 2021-08-17 LAB — SURGICAL PATHOLOGY

## 2021-08-17 MED ORDER — OXYCODONE HCL 5 MG PO TABS
5.0000 mg | ORAL_TABLET | ORAL | 0 refills | Status: DC | PRN
Start: 2021-08-17 — End: 2021-11-02
  Filled 2021-08-17: qty 15, 2d supply, fill #0

## 2021-08-17 NOTE — Telephone Encounter (Signed)
Ms Freyre states she is eating, drinking and urinating well. She has not had a BM and not passed gas. No N/V. She will begin taking the Senakot-S bid with 8 oz of water.  She will also begin Miralax 1 capful bid. She did have the bowel prep prior to surgery  which may cause her to not have BM for another day or so but with taking the Oxycodone 1-2 tabs every 4 hrs want to keep her bowels moving. Encouraged her to drink plenty of water.  Her pain level has been a ~8/10 most of the time except for 0100 to 0500 this am as she took  400 mg of Ibuprofen and then took another 200 near 0500.  She began experiencing increased cough which has happened in the past with ibuprofen and her asthma. Pain level was a 6/10 at that time. She stopped using the Ibuprofen. She is using 200 mg of gabapentin TID,Tylenol 1000 mg q 6 hours, and the Oxycodone 5 mg every 5-7 hours as she only has 15 tablets.Pt is up ambulating. Reviewed with Joylene John, NP. Told Ms Coke that she can take Oxycodone 1-2 tabs q4 hours as noted above. Melissa to send in 15 more tablets. She denies fever or chills. Incisions are dry and intact.   Instructed to call office with any fever, chills, purulent drainage, uncontrolled pain or any other questions or concerns. Patient verbalizes understanding.  Pt aware of post op appointments as well as the office number [phone] and after hours number [phone] to call if she has any questions or concerns

## 2021-08-17 NOTE — Progress Notes (Signed)
See RN note. Patient requesting refill on oxycodone. She develops a cough while taking ibuprofen.

## 2021-08-22 ENCOUNTER — Other Ambulatory Visit: Payer: Self-pay

## 2021-08-22 MED ORDER — CONTRAVE 8-90 MG PO TB12
ORAL_TABLET | ORAL | 0 refills | Status: DC
  Filled 2021-08-22 – 2021-08-27 (×2): qty 360, 90d supply, fill #0
  Filled 2021-08-27: qty 70, 28d supply, fill #0
  Filled 2022-06-07 – 2022-06-12 (×5): qty 360, 90d supply, fill #0
  Filled 2022-06-19 – 2022-06-20 (×2): qty 78, 30d supply, fill #0

## 2021-08-27 ENCOUNTER — Other Ambulatory Visit: Payer: Self-pay

## 2021-08-27 MED ORDER — CONTRAVE 8-90 MG PO TB12
ORAL_TABLET | ORAL | 0 refills | Status: DC
  Filled 2021-08-27: qty 360, 90d supply, fill #0
  Filled 2021-08-27: qty 120, 30d supply, fill #0
  Filled 2021-08-27 (×2): qty 360, 90d supply, fill #0

## 2021-08-28 ENCOUNTER — Other Ambulatory Visit: Payer: Self-pay

## 2021-09-03 ENCOUNTER — Encounter: Payer: Self-pay | Admitting: Gynecologic Oncology

## 2021-09-03 ENCOUNTER — Inpatient Hospital Stay: Payer: 59 | Attending: Gynecologic Oncology | Admitting: Gynecologic Oncology

## 2021-09-03 ENCOUNTER — Other Ambulatory Visit: Payer: Self-pay

## 2021-09-03 ENCOUNTER — Telehealth: Payer: Self-pay | Admitting: *Deleted

## 2021-09-03 ENCOUNTER — Telehealth: Payer: Self-pay

## 2021-09-03 VITALS — BP 129/80 | HR 85 | Temp 97.6°F | Resp 16 | Ht 65.0 in | Wt 178.0 lb

## 2021-09-03 DIAGNOSIS — Z90722 Acquired absence of ovaries, bilateral: Secondary | ICD-10-CM

## 2021-09-03 DIAGNOSIS — N809 Endometriosis, unspecified: Secondary | ICD-10-CM

## 2021-09-03 DIAGNOSIS — Z90710 Acquired absence of both cervix and uterus: Secondary | ICD-10-CM

## 2021-09-03 DIAGNOSIS — N939 Abnormal uterine and vaginal bleeding, unspecified: Secondary | ICD-10-CM

## 2021-09-03 NOTE — Telephone Encounter (Signed)
Received call from Ms. Rantz regarding one of her incisions from surgery 08/16/2021.  Patient stated the right side incision is inflamed, red around edges, no puss and dryed slough is dark brown in color.  She pulled dermabond off on day 15 Post Op and the suture broke.  Patient states incision is 0.5 cm deep and is packing it with gauze and saline since Saturday 09/01/2021.  Informed patient to call with any questions or concerns, but tolerating wet to dry dressing well.   Patient had a Post Op appointment on Wednesday 09/05/2021 and moved appointment to today 09/03/2021 at 3 pm.

## 2021-09-03 NOTE — Progress Notes (Signed)
Gynecologic Oncology Return Clinic Visit  09/03/2021  Reason for Visit: Follow-up after surgery, wound check  Treatment History: 08/16/2021: Robotic total hysterectomy, bilateral salpingectomy, right oophorectomy, and right ureterolysis as well as enterolysis in the setting of stage IV endometriosis  Interval History: Patient presents today for a postoperative visit.  She was scheduled for Wednesday but called secondary to her right lateral incision opening and requiring some debriding.  She has been packing the incision since.  This morning, she was able to probe the incision to a depth of about 2 cm.  She also debrided the edge which had some sloughing dead tissue.  From a postoperative standpoint, she notes doing very well.  Her abdominal and pelvic pain has improved significantly since surgery.  She endorses a good appetite without nausea or emesis.  She reports return to normal bowel function and denies any urinary symptoms after having several days of spasms when she emptied her bladder after the surgery.  She had an episode of vaginal bleeding on Saturday that was 20 or 30 minutes and then some light spotting afterward.  She denies any other vaginal bleeding or discharge.  Past Medical/Surgical History: Past Medical History:  Diagnosis Date   Asthma    Endometriosis    GERD (gastroesophageal reflux disease)    History of kidney stones    Hydrosalpinx    left   Hyperlipidemia    Repatha   Hypertension    PAI-1 4G/4G genotype    PCOS (polycystic ovarian syndrome)    PONV (postoperative nausea and vomiting)    severe   Seasonal allergies    Shingles     Past Surgical History:  Procedure Laterality Date   ANTERIOR CRUCIATE LIGAMENT REPAIR Right 1995   CHROMOPERTUBATION N/A 05/03/2015   Procedure: CHROMOPERTUBATION;  Surgeon: Governor Specking, MD;  Location: [facility];  Service: Gynecology;  Laterality: N/A;   EXTRACORPOREAL SHOCK WAVE LITHOTRIPSY  2008    LAPAROSCOPY N/A 03/28/2015   Procedure: LAPAROSCOPY DIAGNOSTIC;  Surgeon: Brien Few, MD;  Location: [facility];  Service: Gynecology;  Laterality: N/A;   LAPAROSCOPY N/A 05/03/2015   Procedure: LAPAROSCOPY, EXCISION ENDOMETRIOSIS, ENTERO LYSIS, WITH ENDOMETRIAL BIOPSY;  Surgeon: Governor Specking, MD;  Location: [facility];  Service: Gynecology;  Laterality: N/A;   NASAL SEPTOPLASTY W/ TURBINOPLASTY Bilateral 06/20/2017   Procedure: NASAL SEPTOPLASTY WITH BILATERAL TURBINATE REDUCTION;  Surgeon: Melida Quitter, MD;  Location: [facility];  Service: ENT;  Laterality: Bilateral;   ROBOTIC ASSISTED TOTAL HYSTERECTOMY N/A 08/16/2021   Procedure: XI ROBOTIC ASSISTED TOTAL HYSTERECTOMY; RIGHT SALPINGO-OOPHERECTOMY;  Surgeon: [name], MD;  Location: [facility];  Service: Gynecology;  Laterality: N/A;   UNILATERAL SALPINGECTOMY Left 05/03/2015   Procedure: LEFT SALPINGECTOMY;  Surgeon: Governor Specking, MD;  Location: [facility];  Service: Gynecology;  Laterality: Left;   WISDOM TOOTH EXTRACTION      Family History  Problem Relation Age of Onset   Allergic rhinitis Mother    Asthma Mother    Eczema Mother    Stroke Father    COPD Maternal Grandmother    Colon cancer Maternal Grandmother    Eczema Maternal Grandfather    Dementia Maternal Grandfather    COPD Maternal Grandfather    Stroke Paternal Grandmother    Breast cancer Paternal Grandmother    Angioedema Neg Hx    Immunodeficiency Neg Hx    Urticaria Neg Hx     Social History   Socioeconomic History   Marital status: Married  Spouse name: Not on file   Number of children: 1   Years of education: Not on file   Highest education level: Not on file  Occupational History   Occupation: nurse practitioner    Employer: [employer]  Tobacco Use   Smoking status: Never   Smokeless tobacco: Never  Vaping Use   Vaping Use: Never used  Substance and Sexual Activity   Alcohol use: No   Drug use: No    Sexual activity: Yes    Partners: Male    Birth control/protection: Pill  Other Topics Concern   Not on file  Social History Narrative   Exercise daily   Social Determinants of Health   Financial Resource Strain: Not on file  Food Insecurity: Not on file  Transportation Needs: Not on file  Physical Activity: Not on file  Stress: Not on file  Social Connections: Not on file    Current Medications:  Current Outpatient Medications:    albuterol (VENTOLIN HFA) 108 (90 Base) MCG/ACT inhaler, INHALE 2 PUFFS BY MOUTH EVERY 6 HOURS AS NEEDED FOR WHEEZING OR SHORTNESS OF BREATH. (Patient taking differently: Inhale 2 puffs into the lungs every 6 (six) hours as needed for wheezing or shortness of breath.), Disp: 18 g, Rfl: 1   aspirin 81 MG tablet, Take 81 mg by mouth daily., Disp: , Rfl:    azaTHIOprine (IMURAN) 50 MG tablet, TAKE 1 TABLET BY MOUTH 3 TIMES DAILY (Patient taking differently: Take 50 mg by mouth 3 (three) times daily.), Disp: 270 tablet, Rfl: 2   budesonide (PULMICORT FLEXHALER) 180 MCG/ACT inhaler, INHALE TWO PUFFS BY MOUTH INTO THE LUNGS TWICE A DAY TO PREVENT COUGH OR WHEEZE. RINSE, GARGLE AND SPIT AFTER USE. (Patient taking differently: Inhale 2 puffs into the lungs 2 (two) times daily as needed (to prevent cough or wheeze).), Disp: 3 each, Rfl: 3   budesonide (RHINOCORT ALLERGY) 32 MCG/ACT nasal spray, ONE SPRAY EACH NOSTRIL TWICE A DAY FOR NASAL CONGESTION OR DRAINAGE. (Patient taking differently: Place 1 spray into both nostrils 2 (two) times daily.), Disp: 8.6 g, Rfl: 5   cetirizine (ZYRTEC) 10 MG tablet, Take 1 tablet (10 mg total) by mouth at bedtime., Disp: 30 tablet, Rfl: 5   EPINEPHrine 0.3 mg/0.3 mL IJ SOAJ injection, INJECT 0.3 MLS (0.3 MG TOTAL) INTO THE MUSCLE ONCE FOR 1 DOSE. USE AS DIRECTED FOR SEVERE ALLERGIC REACTION., Disp: 4 each, Rfl: 0   erythromycin base (E-MYCIN) 500 MG tablet, Take two tablets at 2 pm, 3 pm, and 10 pm the day before surgery, Disp: 6  tablet, Rfl: 0   Evolocumab (REPATHA SURECLICK) 631 MG/ML SOAJ, Inject 1 Dose into the skin every 14 (fourteen) days., Disp: 2 mL, Rfl: 11   gabapentin (NEURONTIN) 100 MG capsule, Take 1 capsule (100 mg total) by mouth 3 (three) times daily. For AFTER surgery, do not drive if drowsiness occurs, Disp: 50 capsule, Rfl: 0   ibuprofen (ADVIL) 800 MG tablet, Take 1 tablet (800 mg total) by mouth every 8 (eight) hours as needed for moderate pain. For AFTER surgery only, Disp: 30 tablet, Rfl: 0   LARIN 1.5/30 1.5-30 MG-MCG tablet, Take 1 tablet by mouth daily., Disp: , Rfl:    Loteprednol Etabonate (LOTEMAX SM) 0.38 % GEL, Place 1 drop into the right eye in the morning and at bedtime. (Patient taking differently: Place 1 drop into the right eye daily.), Disp: 5 g, Rfl: 11   magnesium oxide-pyridoxine (BEELITH) 362-20 MG TABS, Take 1 tablet by  mouth every evening. , Disp: , Rfl:    metoprolol succinate (TOPROL-XL) 25 MG 24 hr tablet, Take 12.5 mg by mouth daily., Disp: , Rfl:    metoprolol succinate (TOPROL-XL) 25 MG 24 hr tablet, 1 tablet, Disp: , Rfl:    montelukast (SINGULAIR) 10 MG tablet, TAKE 1 TABLET (10 MG TOTAL) BY MOUTH AT BEDTIME., Disp: 90 tablet, Rfl: 0   Naltrexone-buPROPion HCl ER (CONTRAVE) 8-90 MG TB12, Take 1 tablet by mouth in the morning for a week, then one tablet by mouth twice daily for a week, then two tablets by mouth in the morning and one tablet in the evening for a week, then two tablets by mouth in the morning and two tablets by mouth in the evening thereafter., Disp: 360 tablet, Rfl: 0   Naltrexone-buPROPion HCl ER (CONTRAVE) 8-90 MG TB12, Take 2 tablets twice a day by oral route for 90 days., Disp: 360 tablet, Rfl: 0   neomycin (MYCIFRADIN) 500 MG tablet, Take two tablets at 2 pm, 3 pm, and 10 pm the day before surgery, Disp: 6 tablet, Rfl: 0   Norethindrone Acetate-Ethinyl Estradiol (LARIN 1.5/30) 1.5-30 MG-MCG tablet, TAKE 1 TABLET BY MOUTH DAILY (CONTINUOUS ACTIVE PILLS), Disp:  21 tablet, Rfl: 0   oxyCODONE (OXY IR/ROXICODONE) 5 MG immediate release tablet, Take 1-2 tablets (5-10 mg total) by mouth every 4 (four) hours as needed for severe pain. For AFTER surgery only, do not take and drive, Disp: 15 tablet, Rfl: 0   potassium citrate (UROCIT-K) 10 MEQ (1080 MG) SR tablet, TAKE 1 TABLET BY MOUTH 2 TIMES DAILY WITH MEALS., Disp: 180 tablet, Rfl: 1   potassium citrate (UROCIT-K) 10 MEQ (1080 MG) SR tablet, Take 2 tablets (20 mEq total) by mouth 2 times daily. Take two tablets by mouth in am and pm, Disp: 120 tablet, Rfl: 2   potassium citrate (UROCIT-K) 10 MEQ (1080 MG) SR tablet, Take by mouth., Disp: , Rfl:    senna-docusate (SENOKOT-S) 8.6-50 MG tablet, Take 2 tablets by mouth at bedtime. For AFTER surgery, do not take if having diarrhea, Disp: 30 tablet, Rfl: 0   timolol (TIMOPTIC) 0.5 % ophthalmic solution, PLACE 1 DROP INTO THE RIGHT EYE DAILY. (Patient taking differently: Place 1 drop into the right eye daily.), Disp: 5 mL, Rfl: 11   valACYclovir (VALTREX) 1000 MG tablet, Take 1 tablet (1,000 mg total) by mouth daily., Disp: 90 tablet, Rfl: 11  Current Facility-Administered Medications:    diphenhydrAMINE (BENADRYL) injection 50 mg, 50 mg, Intramuscular, Once PRN, Roma Schanz R, DO  Review of Systems: Pertinent positives include pelvic pain, wound. Denies appetite changes, fevers, chills, fatigue, unexplained weight changes. Denies hearing loss, neck lumps or masses, mouth sores, ringing in ears or voice changes. Denies cough or wheezing.  Denies shortness of breath. Denies chest pain or palpitations. Denies leg swelling. Denies abdominal distention, pain, blood in stools, constipation, diarrhea, nausea, vomiting, or early satiety. Denies pain with intercourse, dysuria, frequency, hematuria or incontinence. Denies hot flashes or vaginal discharge.   Denies joint pain, back pain or muscle pain/cramps. Denies itching, rash. Denies dizziness, headaches,  numbness or seizures. Denies swollen lymph nodes or glands, denies easy bruising or bleeding. Denies anxiety, depression, confusion, or decreased concentration.  Physical Exam: BP 129/80 (BP Location: Left Arm, Patient Position: Sitting)   Pulse 85   Temp 97.6 F (36.4 C) (Tympanic)   Resp 16   Ht 5\' 5"  (1.651 m)   Wt 178 lb (80.7 kg)   SpO2  100%   BMI 29.62 kg/m  General: Alert, oriented, no acute distress. HEENT: Normocephalic, atraumatic, sclera anicteric. Chest: Clear to auscultation bilaterally.  Unlabored breathing on room air. Abdomen: soft, nontender.  Normoactive bowel sounds.  No masses or hepatosplenomegaly appreciated.  Well-healing incisions.  Right lateral incision is open along its length and can be gently probed with a Q-tip to a depth of less than 1 cm.  There is some mildly devitalized tissue along the superior aspect, no erythema or exudate.  No discharge. Extremities: Grossly normal range of motion.  Warm, well perfused.  No edema bilaterally. Skin: No rashes or lesions noted. GU: Normal appearing external genitalia without erythema, excoriation, or lesions.  Speculum exam reveals cuff intact, sutures visible.  Bimanual exam reveals cuff intact, no fluctuance or tenderness with palpation.   Laboratory & Radiologic Studies: A. UTERUS, CERVIX, RIGHT OVARY AND TUBE, HYSTERECTOMY, RIGHT SALPINGO  OOPHORECTOMY:  - Uterus with adenomyosis and endometriosis  - Benign leiomyoma, 1.4 cm  - Benign inactive endometrium  - Right ovary with hemorrhagic cyst, 0.8 cm, likely endometriotic cyst  - Benign unremarkable cervix  - Benign unremarkable right fallopian tube  - No evidence of malignancy   Assessment & Plan: Rachel Hooper is a 40 y.o. woman with Stage IV endometriosis now status post robotic surgery almost 3 weeks ago who presents for follow-up and has a small superficial wound dehiscence of one of her laparoscopic incisions.  Patient is overall doing very well  and meeting postoperative milestones.  We discussed continued postoperative expectations as well as physical activity restrictions.  In terms of her right lateral incision, there is no evidence of infection.  I suspect that she may have had a small seroma or hematoma that caused the incision to open.  She was given some quarter inch packing tape and I advised her that for the next week or so she could use some sterile water to clean out the incision and gently pack it.  I think packing longer than that we will artificially keep it open.  We discussed signs and symptoms that would be concerning for infection.  Patient was given a paper copy of her pathology report.  I reviewed findings from the time of surgery and went over pictures that I took in detail with her.  She has 1 remaining ovary.  Patient will be referred back to her gynecologist for future women's health care needs.  34 minutes of total time was spent for this patient encounter, including preparation, face-to-face counseling with the patient and coordination of care, and documentation of the encounter.  Jeral Pinch, MD  Division of Gynecologic Oncology  Department of Obstetrics and Gynecology  [facility]

## 2021-09-03 NOTE — Telephone Encounter (Signed)
Patient called and stated "I forgot to let Dr Rachel Hooper know that I do want hormone therapy. Not sure if that will birth control pills or not. My next annual appt is not until the end of January, and Dr Cherylann Banas doesn't think I need them. I do want the therapy. I only have one week of pills left. What do I need to do?" Explained that the message will be given to Dr Rachel Hooper and that office will call her back tomorrow.

## 2021-09-03 NOTE — Patient Instructions (Signed)
It was good to see you today.  You are healing very well from surgery.  For the small opening in your right lateral incision, you can pack a small amount of gauze and use a flush once a day to clean this area.  I would suggest just using a 4 x 4 Band-Aid over the area.  My guess is that you will not need to pack this for more than a week.  Lifting restrictions, no more than 10 pounds, are in place until 6 weeks after surgery.  Nothing in the vagina until at least 8 weeks after surgery.

## 2021-09-04 ENCOUNTER — Other Ambulatory Visit: Payer: Self-pay | Admitting: Gynecologic Oncology

## 2021-09-04 ENCOUNTER — Other Ambulatory Visit: Payer: Self-pay

## 2021-09-04 DIAGNOSIS — N939 Abnormal uterine and vaginal bleeding, unspecified: Secondary | ICD-10-CM

## 2021-09-04 DIAGNOSIS — N809 Endometriosis, unspecified: Secondary | ICD-10-CM

## 2021-09-04 MED ORDER — NORETHINDRONE ACET-ETHINYL EST 1.5-30 MG-MCG PO TABS
ORAL_TABLET | ORAL | 2 refills | Status: DC
  Filled 2021-09-04: qty 63, 63d supply, fill #0

## 2021-09-04 MED FILL — Azathioprine Tab 50 MG: ORAL | 90 days supply | Qty: 270 | Fill #0 | Status: AC

## 2021-09-04 NOTE — Telephone Encounter (Signed)
Called patient about billing issue with labs (NMR lipoprofile, LPa) that are not covered. She 3-way called [employer] and we spoke with representative.   Appeal letter (statement of medical necessity) and MD note will need to be faxed to [facility] at [phone] Ref: claim # [id number]  Test codes:  59292 - lipoprotein A 83704 - NMR lipoprofile    Total call time: 25 minutes

## 2021-09-04 NOTE — Progress Notes (Signed)
Rachel Hooper 1.5/30 sent in per patient request per Dr. Berline Lopes. She is supposed to follow up with Dr. Ronita Hipps and has an appt in Jan 2023.

## 2021-09-05 ENCOUNTER — Ambulatory Visit: Payer: 59 | Admitting: Gynecologic Oncology

## 2021-09-07 ENCOUNTER — Telehealth: Payer: Self-pay | Admitting: Internal Medicine

## 2021-09-07 NOTE — Telephone Encounter (Signed)
Faxed appeal letter + MD note for labs denied (DOS: 06/27/21) to [facility] @ [phone] Ref: 09/03/21 MyChart message

## 2021-09-10 ENCOUNTER — Other Ambulatory Visit: Payer: Self-pay

## 2021-09-18 ENCOUNTER — Ambulatory Visit (INDEPENDENT_AMBULATORY_CARE_PROVIDER_SITE_OTHER): Payer: 59 | Admitting: *Deleted

## 2021-09-18 ENCOUNTER — Other Ambulatory Visit (HOSPITAL_COMMUNITY): Payer: Self-pay

## 2021-09-18 ENCOUNTER — Other Ambulatory Visit: Payer: Self-pay

## 2021-09-18 DIAGNOSIS — T63441D Toxic effect of venom of bees, accidental (unintentional), subsequent encounter: Secondary | ICD-10-CM | POA: Diagnosis not present

## 2021-09-18 DIAGNOSIS — H4061X0 Glaucoma secondary to drugs, right eye, stage unspecified: Secondary | ICD-10-CM | POA: Diagnosis not present

## 2021-09-18 DIAGNOSIS — T380X5A Adverse effect of glucocorticoids and synthetic analogues, initial encounter: Secondary | ICD-10-CM | POA: Diagnosis not present

## 2021-09-18 DIAGNOSIS — H209 Unspecified iridocyclitis: Secondary | ICD-10-CM | POA: Diagnosis not present

## 2021-09-18 DIAGNOSIS — B0052 Herpesviral keratitis: Secondary | ICD-10-CM | POA: Diagnosis not present

## 2021-09-18 MED ORDER — TIMOLOL MALEATE 0.5 % OP SOLN
1.0000 [drp] | Freq: Every day | OPHTHALMIC | 11 refills | Status: DC
  Filled 2021-09-18: qty 5, 90d supply, fill #0

## 2021-09-18 MED ORDER — VALACYCLOVIR HCL 1 G PO TABS
1000.0000 mg | ORAL_TABLET | Freq: Every day | ORAL | 11 refills | Status: DC
  Filled 2021-09-18 – 2021-11-27 (×2): qty 90, 90d supply, fill #0
  Filled 2022-02-23: qty 90, 90d supply, fill #1
  Filled 2022-03-01: qty 90, 90d supply, fill #0
  Filled 2022-05-13: qty 90, 90d supply, fill #1

## 2021-09-18 MED ORDER — LOTEMAX SM 0.38 % OP GEL
1.0000 [drp] | Freq: Two times a day (BID) | OPHTHALMIC | 11 refills | Status: DC
  Filled 2021-09-18: qty 5, 50d supply, fill #0

## 2021-09-18 MED ORDER — AZATHIOPRINE 50 MG PO TABS
50.0000 mg | ORAL_TABLET | Freq: Three times a day (TID) | ORAL | 2 refills | Status: DC
  Filled 2021-09-18 – 2021-12-21 (×2): qty 270, 90d supply, fill #0
  Filled 2022-06-20: qty 270, 90d supply, fill #1

## 2021-09-21 ENCOUNTER — Encounter: Payer: 59 | Admitting: Gynecologic Oncology

## 2021-09-25 ENCOUNTER — Ambulatory Visit: Payer: 59

## 2021-09-27 ENCOUNTER — Ambulatory Visit: Payer: 59

## 2021-10-07 ENCOUNTER — Other Ambulatory Visit: Payer: Self-pay | Admitting: Family

## 2021-10-07 ENCOUNTER — Telehealth: Payer: 59 | Admitting: Family

## 2021-10-07 DIAGNOSIS — U071 COVID-19: Secondary | ICD-10-CM

## 2021-10-07 MED ORDER — MOLNUPIRAVIR EUA 200MG CAPSULE: 4.0000 | ORAL_CAPSULE | Freq: Two times a day (BID) | ORAL | 0 refills | Status: DC

## 2021-10-07 MED ORDER — NIRMATRELVIR/RITONAVIR (PAXLOVID)TABLET: 3.0000 | ORAL_TABLET | Freq: Two times a day (BID) | ORAL | 0 refills | Status: AC

## 2021-10-07 MED ORDER — BENZONATATE 100 MG PO CAPS
100.0000 mg | ORAL_CAPSULE | Freq: Three times a day (TID) | ORAL | 0 refills | Status: DC | PRN
Start: 2021-10-07 — End: 2022-12-05

## 2021-10-07 NOTE — Patient Instructions (Signed)
10 Things You Can Do to Manage Your COVID-19 Symptoms at Home ?If you have possible or confirmed COVID-19 ?Stay home except to get medical care. ?Monitor your symptoms carefully. If your symptoms get worse, call your healthcare provider immediately. ?Get rest and stay hydrated. ?If you have a medical appointment, call the healthcare provider ahead of time and tell them that you have or may have COVID-19. ?For medical emergencies, call 911 and notify the dispatch personnel that you have or may have COVID-19. ?Cover your cough and sneezes with a tissue or use the inside of your elbow. ?Wash your hands often with soap and water for at least 20 seconds or clean your hands with an alcohol-based hand sanitizer that contains at least 60% alcohol. ?As much as possible, stay in a specific room and away from other people in your home. Also, you should use a separate bathroom, if available. If you need to be around other people in or outside of the home, wear a mask. ?Avoid sharing personal items with other people in your household, like dishes, towels, and bedding. ?Clean all surfaces that are touched often, like counters, tabletops, and doorknobs. Use household cleaning sprays or wipes according to the label instructions. ?cdc.gov/coronavirus ?05/05/2020 ?This information is not intended to replace advice given to you by your health care provider. Make sure you discuss any questions you have with your health care provider. ?Document Revised: 06/29/2021 Document Reviewed: 06/29/2021 ?Elsevier Patient Education ? 2022 Elsevier Inc. ? ?

## 2021-10-07 NOTE — Progress Notes (Signed)
Virtual Visit Consent   [name], you are scheduled for a virtual visit with a [facility] provider today.     Just as with appointments in the office, your consent must be obtained to participate.  Your consent will be active for this visit and any virtual visit you may have with one of our providers in the next 365 days.     If you have a MyChart account, a copy of this consent can be sent to you electronically.  All virtual visits are billed to your insurance company just like a traditional visit in the office.    As this is a virtual visit, video technology does not allow for your provider to perform a traditional examination.  This may limit your provider's ability to fully assess your condition.  If your provider identifies any concerns that need to be evaluated in person or the need to arrange testing (such as labs, EKG, etc.), we will make arrangements to do so.     Although advances in technology are sophisticated, we cannot ensure that it will always work on either your end or our end.  If the connection with a video visit is poor, the visit may have to be switched to a telephone visit.  With either a video or telephone visit, we are not always able to ensure that we have a secure connection.     I need to obtain your verbal consent now.   Are you willing to proceed with your visit today?    Rachel Hooper has provided verbal consent on 10/07/2021 for a virtual visit (video or telephone).   [name], FNP   Date: 10/07/2021 5:37 PM   Virtual Visit via Video Note   I, [name], connected with  Rachel Hooper  ([id number], [birth year]) on 10/07/21 at  5:15 PM EST by a video-enabled telemedicine application and verified that I am speaking with the correct person using two identifiers.  Location: Patient: Virtual Visit Location Patient: Home Provider: Virtual Visit Location Provider: Home Office   I discussed the limitations of evaluation and management by  telemedicine and the availability of in person appointments. The patient expressed understanding and agreed to proceed.    History of Present Illness: Rachel Hooper is a 40 y.o. who identifies as a female who was assigned female at birth, and is being seen today for COVID. She reports her symptoms started today and tested positive today. She reports her husband tested positive a few days ago. She has asthma.   HPI: Cough This is a new problem. The current episode started today. The problem has been gradually worsening. The problem occurs every few minutes. The cough is Non-productive. Associated symptoms include chills, ear congestion, ear pain, headaches, myalgias and shortness of breath. Pertinent negatives include no fever, nasal congestion, postnasal drip or wheezing. She has tried rest (Pulmicort BID) for the symptoms. The treatment provided mild relief.   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    Allergies:  Allergies  Allergen Reactions   Bee Venom Anaphylaxis    And wasp venom   Pneumococcal Vaccines Anaphylaxis   Atorvastatin     Fatigue, Brain Fog, Muscle Pain   Mixed Vespid Venom    Rosuvastatin     Fatigue, Brain Fog, Muscle Pain   Wasp Venom    Medications:  Current Outpatient Medications:    benzonatate (TESSALON PERLES) 100 MG capsule, Take 1 capsule (100 mg total) by mouth 3 (three) times daily as needed., Disp: 20 capsule,  Rfl: 0   molnupiravir EUA (LAGEVRIO) 200 mg CAPS capsule, Take 4 capsules (800 mg total) by mouth 2 (two) times daily for 5 days., Disp: 40 capsule, Rfl: 0   albuterol (VENTOLIN HFA) 108 (90 Base) MCG/ACT inhaler, INHALE 2 PUFFS BY MOUTH EVERY 6 HOURS AS NEEDED FOR WHEEZING OR SHORTNESS OF BREATH. (Patient taking differently: Inhale 2 puffs into the lungs every 6 (six) hours as needed for wheezing or shortness of breath.), Disp: 18 g, Rfl: 1   aspirin 81 MG tablet, Take 81 mg by mouth daily., Disp: , Rfl:    azaTHIOprine (IMURAN) 50 MG tablet, Take 1 tablet (50 mg total) by mouth 3 (three) times daily., Disp: 270 tablet, Rfl: 2   budesonide (PULMICORT FLEXHALER) 180 MCG/ACT inhaler, INHALE TWO PUFFS BY MOUTH INTO THE LUNGS TWICE A DAY TO PREVENT COUGH OR WHEEZE. RINSE, GARGLE AND SPIT AFTER USE. (Patient taking differently: Inhale 2 puffs into the lungs 2 (two) times daily as needed (to prevent cough or wheeze).), Disp: 3 each, Rfl: 3   budesonide (RHINOCORT ALLERGY) 32 MCG/ACT nasal spray, ONE SPRAY EACH NOSTRIL TWICE A DAY FOR NASAL CONGESTION OR DRAINAGE. (Patient taking differently: Place 1 spray into both nostrils 2 (two) times daily.), Disp: 8.6 g, Rfl: 5   cetirizine (ZYRTEC) 10 MG tablet, Take 1 tablet (10 mg total) by mouth at bedtime., Disp: 30 tablet, Rfl: 5   EPINEPHrine 0.3 mg/0.3 mL IJ SOAJ injection, INJECT 0.3 MLS (0.3 MG TOTAL) INTO THE MUSCLE ONCE FOR 1 DOSE. USE AS DIRECTED FOR SEVERE ALLERGIC REACTION., Disp: 4 each, Rfl: 0   erythromycin base (E-MYCIN) 500 MG tablet, Take two tablets at 2 pm, 3 pm, and 10 pm the day before surgery, Disp: 6 tablet, Rfl: 0   Evolocumab (REPATHA SURECLICK) 053 MG/ML SOAJ, Inject 1 Dose into the skin every 14 (fourteen) days., Disp: 2 mL, Rfl: 11   gabapentin (NEURONTIN) 100 MG capsule, Take 1 capsule (100 mg total) by mouth 3 (three) times daily. For AFTER surgery, do not drive if drowsiness occurs, Disp: 50 capsule, Rfl: 0   ibuprofen (ADVIL) 800 MG  tablet, Take 1 tablet (800 mg total) by mouth every 8 (eight) hours as needed for moderate pain. For AFTER surgery only, Disp: 30 tablet, Rfl: 0   Loteprednol Etabonate (LOTEMAX SM) 0.38 % GEL, Place 1 drop into the right eye in the morning and at bedtime. (Patient taking differently: Place 1 drop into the right eye daily.), Disp: 5 g, Rfl: 11   Loteprednol Etabonate (LOTEMAX SM) 0.38 % GEL, Place 1 drop into the right eye in the morning and at bedtime., Disp: 5 g, Rfl: 11   magnesium oxide-pyridoxine (BEELITH) 362-20 MG TABS, Take 1 tablet by mouth every evening. , Disp: , Rfl:    metoprolol succinate (TOPROL-XL) 25 MG 24 hr tablet, 1 tablet Orally Once a day 30 day(s) (Patient taking differently: Take 12.5 mg by mouth daily.), Disp: 30 tablet, Rfl: 12   metoprolol succinate (TOPROL-XL)  25 MG 24 hr tablet, Take 12.5 mg by mouth daily., Disp: , Rfl:    metoprolol succinate (TOPROL-XL) 25 MG 24 hr tablet, 1 tablet, Disp: , Rfl:    montelukast (SINGULAIR) 10 MG tablet, TAKE 1 TABLET (10 MG TOTAL) BY MOUTH AT BEDTIME., Disp: 90 tablet, Rfl: 0   Naltrexone-buPROPion HCl ER (CONTRAVE) 8-90 MG TB12, Take 1 tablet by mouth in the morning for a week, then one tablet by mouth twice daily for a week, then two tablets by mouth in the morning and one tablet in the evening for a week, then two tablets by mouth in the morning and two tablets by mouth in the evening thereafter., Disp: 360 tablet, Rfl: 0   Naltrexone-buPROPion HCl ER (CONTRAVE) 8-90 MG TB12, Take 2 tablets twice a day by oral route for 90 days., Disp: 360 tablet, Rfl: 0   neomycin (MYCIFRADIN) 500 MG tablet, Take two tablets at 2 pm, 3 pm, and 10 pm the day before surgery, Disp: 6 tablet, Rfl: 0   Norethindrone Acetate-Ethinyl Estradiol (LARIN 1.5/30) 1.5-30 MG-MCG tablet, TAKE 1 TABLET BY MOUTH DAILY (CONTINUOUS ACTIVE PILLS), Disp: 21 tablet, Rfl: 2   oxyCODONE (OXY IR/ROXICODONE) 5 MG immediate release tablet, Take 1-2 tablets (5-10 mg total) by  mouth every 4 (four) hours as needed for severe pain. For AFTER surgery only, do not take and drive, Disp: 15 tablet, Rfl: 0   potassium citrate (UROCIT-K) 10 MEQ (1080 MG) SR tablet, TAKE 1 TABLET BY MOUTH 2 TIMES DAILY WITH MEALS., Disp: 180 tablet, Rfl: 1   potassium citrate (UROCIT-K) 10 MEQ (1080 MG) SR tablet, Take 2 tablets (20 mEq total) by mouth 2 times daily. Take two tablets by mouth in am and pm, Disp: 120 tablet, Rfl: 2   potassium citrate (UROCIT-K) 10 MEQ (1080 MG) SR tablet, Take by mouth., Disp: , Rfl:    senna-docusate (SENOKOT-S) 8.6-50 MG tablet, Take 2 tablets by mouth at bedtime. For AFTER surgery, do not take if having diarrhea, Disp: 30 tablet, Rfl: 0   timolol (TIMOPTIC) 0.5 % ophthalmic solution, PLACE 1 DROP INTO THE RIGHT EYE DAILY. (Patient taking differently: Place 1 drop into the right eye daily.), Disp: 5 mL, Rfl: 11   timolol (TIMOPTIC) 0.5 % ophthalmic solution, Place 1 drop into the right eye daily., Disp: 5 mL, Rfl: 11   valACYclovir (VALTREX) 1000 MG tablet, Take 1 tablet (1,000 mg total) by mouth daily., Disp: 90 tablet, Rfl: 11   valACYclovir (VALTREX) 1000 MG tablet, Take 1 tablet (1,000 mg total) by mouth daily., Disp: 90 tablet, Rfl: 11  Current Facility-Administered Medications:    diphenhydrAMINE (BENADRYL) injection 50 mg, 50 mg, Intramuscular, Once PRN, Carollee Herter, Kendrick Fries R, DO  Observations/Objective: Patient is well-developed, well-nourished in no acute distress.  Resting comfortably  at home.  Head is normocephalic, atraumatic.  No labored breathing.  Speech is clear and coherent with logical content.  Patient is alert and oriented at baseline.    Assessment and Plan: 1. COVID-19 - molnupiravir EUA (LAGEVRIO) 200 mg CAPS capsule; Take 4 capsules (800 mg total) by mouth 2 (two) times daily for 5 days.  Dispense: 40 capsule; Refill: 0 - benzonatate (TESSALON PERLES) 100 MG capsule; Take 1 capsule (100 mg total) by mouth 3 (three) times daily as  needed.  Dispense: 20 capsule; Refill: 0  COVID positive, rest, force fluids, tylenol as needed, Quarantine for at least 5 days and you are fever free, then must wear a mask out in  public from day 7-18, report any worsening symptoms such as increased shortness of breath, swelling, or continued high fevers. Possible adverse effects discussed with antivirals.    Follow Up Instructions: I discussed the assessment and treatment plan with the patient. The patient was provided an opportunity to ask questions and all were answered. The patient agreed with the plan and demonstrated an understanding of the instructions.  A copy of instructions were sent to the patient via MyChart unless otherwise noted below.    The patient was advised to call back or seek an in-person evaluation if the symptoms worsen or if the condition fails to improve as anticipated.  Time:  I spent 6 minutes with the patient via telehealth technology discussing the above problems/concerns.    [name], FNP

## 2021-10-08 ENCOUNTER — Telehealth: Payer: 59

## 2021-10-08 ENCOUNTER — Encounter: Payer: 59 | Admitting: Family Medicine

## 2021-10-17 ENCOUNTER — Other Ambulatory Visit: Payer: Self-pay | Admitting: Allergy

## 2021-10-17 ENCOUNTER — Other Ambulatory Visit: Payer: Self-pay

## 2021-10-18 ENCOUNTER — Telehealth: Payer: Self-pay | Admitting: Allergy

## 2021-10-18 ENCOUNTER — Other Ambulatory Visit: Payer: Self-pay | Admitting: *Deleted

## 2021-10-18 ENCOUNTER — Other Ambulatory Visit: Payer: Self-pay

## 2021-10-18 MED ORDER — PULMICORT FLEXHALER 180 MCG/ACT IN AEPB
INHALATION_SPRAY | RESPIRATORY_TRACT | 3 refills | Status: DC
Start: 2021-10-18 — End: 2022-12-04
  Filled 2021-10-18: qty 1, 30d supply, fill #0
  Filled 2021-10-19: qty 3, 90d supply, fill #0
  Filled 2022-06-03: qty 3, 90d supply, fill #1
  Filled 2022-09-24: qty 3, 90d supply, fill #2

## 2021-10-18 MED ORDER — ALBUTEROL SULFATE HFA 108 (90 BASE) MCG/ACT IN AERS
INHALATION_SPRAY | RESPIRATORY_TRACT | 1 refills | Status: DC
Start: 2021-10-18 — End: 2022-08-02
  Filled 2021-10-18: qty 18, 25d supply, fill #0
  Filled 2022-06-03: qty 18, 25d supply, fill #1

## 2021-10-18 MED ORDER — ARNUITY ELLIPTA 200 MCG/ACT IN AEPB
1.0000 | INHALATION_SPRAY | Freq: Two times a day (BID) | RESPIRATORY_TRACT | 5 refills | Status: DC
  Filled 2021-10-18: qty 90, 90d supply, fill #0

## 2021-10-18 NOTE — Telephone Encounter (Signed)
Patient is calling back again for an update.   Please advise.

## 2021-10-18 NOTE — Telephone Encounter (Signed)
Patient called back stating that she needed a PA on the Pulmicort. I did inform patient that the reason being that her first refill was denied was due to her having a courtesy refill previously back in October. Patient expressed that she was aware of the courtesy refill back in October and she was just being able to make an appointment for January. Patient also stated that she wanted a three month supply for all her medications as her medications would be free through the end of the year. I informed her that I would have to inform Beth of the situation and see if a three month supply could be given. After speaking to [facility] she verbalized that it was ok to send one courtesy refill in for the Pulmicort. PA was completed for the Pulmicort and it was denied as her insurance company wants her to try and fail (Asmanex, Arnuity, Flovent HFS/Diskus). Dr.Gallagher was informed of the situation as he was in the office and Dr.Padgett was out of the office. Dr. Ernst Bowler approved patient to begin Arnuity 200 one inhalation twice a day. Called and left a message for patient to inform her of the information of the PA denial and medication change. Patient did not answer and I had to leave a message for her to call our office back to inform her of the medication change.

## 2021-10-18 NOTE — Telephone Encounter (Signed)
Rachel Hooper called in very upset because she put in a refill request for Pulmicort and Rhinocort and it was denied due to her needing an appointment.  Patient has an appointment already for January 13th that was scheduled back in October and doesn't understand why she needs another appointment.  Patient is upset because she has covid and is having breathing difficulties.  Patient would like these called back in and filled and would like for someone to call her.  She is requesting today because she needs them badly.  Please advise.

## 2021-10-18 NOTE — Addendum Note (Signed)
Addended by: Clovis Cao A on: 10/18/2021 07:19 PM   Modules accepted: Orders

## 2021-10-19 ENCOUNTER — Other Ambulatory Visit: Payer: Self-pay

## 2021-10-22 ENCOUNTER — Other Ambulatory Visit: Payer: Self-pay

## 2021-10-29 NOTE — Telephone Encounter (Signed)
Appeal paperwork has been filled out and faxed to [employer] with attached denial letter. Called patient and advised. Patient verbalized understanding.

## 2021-10-29 NOTE — Telephone Encounter (Signed)
Patient called in and asked if we received an appeal form from Rachel Hooper. We did.  Patient states her Prior Rachel Hooper was declined because of missing information.  Patient is requesting that the appeal form be filled out and sent in.  I placed appeal form in nurses box.

## 2021-10-30 NOTE — Telephone Encounter (Signed)
Appeal for Pulmicort was approved. Called patient and advised. Patient verbalized understanding. Appeal approval has been faxed to patients pharmacy, labeled, and placed in bulk scanning.

## 2021-10-30 NOTE — Telephone Encounter (Signed)
Still waiting for fax determination from insurance company in regards to appeal. Received confirmation fax yesterday that they did receive the appeal form.

## 2021-10-31 ENCOUNTER — Other Ambulatory Visit: Payer: Self-pay

## 2021-11-02 ENCOUNTER — Encounter: Payer: Self-pay | Admitting: Allergy

## 2021-11-02 ENCOUNTER — Other Ambulatory Visit: Payer: Self-pay

## 2021-11-02 ENCOUNTER — Ambulatory Visit (INDEPENDENT_AMBULATORY_CARE_PROVIDER_SITE_OTHER): Payer: 59 | Admitting: Allergy

## 2021-11-02 VITALS — BP 126/86 | HR 76 | Temp 97.7°F | Resp 18 | Ht 65.0 in | Wt 180.0 lb

## 2021-11-02 DIAGNOSIS — J301 Allergic rhinitis due to pollen: Secondary | ICD-10-CM

## 2021-11-02 DIAGNOSIS — T63441D Toxic effect of venom of bees, accidental (unintentional), subsequent encounter: Secondary | ICD-10-CM

## 2021-11-02 DIAGNOSIS — J4540 Moderate persistent asthma, uncomplicated: Secondary | ICD-10-CM | POA: Diagnosis not present

## 2021-11-02 NOTE — Patient Instructions (Addendum)
Asthma - doing well - Action plan (in case of respiratory illness or flare) use:  Pulmicort 180-  2-3 puffs twice a day for 1-2 weeks or until symptoms have resolved - Advise may be good idea to take Pulmicort 2 puffs twice a day prior to stopping Imuran if that decision in made and continue for about a week or so after to ensure stabilization of the airway - Continue Montelukast 10 mg once a day for a coughing or wheezing - Ventolin 2 puffs every 4-6 hours if needed for wheezing, coughing, shortness of breath or chest tightness.  Monitor frequency of use.  Asthma control goals:  Full participation in all desired activities (may need albuterol before activity) Albuterol use two time or less a week on average (not counting use with activity) Cough interfering with sleep two time or less a month Oral steroids no more than once a year No hospitalizations  Allergic rhinitis   - Zyrtec 10 mg once a day for runny nose  - Rhinocort 1-2 spray per nostril twice a day for stuffy nose  - Montelukast as above  - Continue allergen immunotherapy per protocol  - Advise once she is well into maintenance (likely this winter/sprin 2025) will be able to reduce medications to as needed use and see how she does  Venom allergy  - continue venom injections at maintenance every 12 weeks  - If you have an insect sting take Benadryl 50 mg every 4 hours and if you have life-threatening symptoms inject him with EpiPen 0.3 mg  Elevated BP  - continue to monitor at home  - discuss with PCP regarding medication management  Follow-up yearly or sooner if needed 

## 2021-11-02 NOTE — Progress Notes (Signed)
Follow-up Note  RE: Rachel Hooper MRN: [id number] DOB: 04-Nov-1980 Date of Office Visit: 11/02/2021   History of present illness: Rachel Hooper is a 41 y.o. female presenting today for follow-up of asthma, allergic rhinitis, venom allergy.  She was last seen in the office on 06/28/20 by myself.   She did have covid illness in December that really affected her breathing where she was needing albuterol use daily and increased use of pulmicort 3-4 times a day.  We did have issues with insurance coverage of her Pulmicort but this was rectified.  She states she just started using albuterol on a daily basis about 5 days ago.  She is feeling better.  And she uses her Pulmicort as maintenance medication 1 to 2 puffs twice a day.  She did take Paxilovid during her illness however she did have rebound illness with it. She is interested in repeating her allergy testing however she does not do so they want to stop her antihistamines for skin testing.  As she does do Zyrtec, Rhinocort and Singulair.  Without these medications she would have increase in nasal and generalized allergy symptoms to the point that it is quite miserable. She does continue to receive her venom immunotherapy at maintenance dosing of every 12 weeks at this time.  She does have access to her epinephrine device  Review of systems in the past 4 weeks: Review of Systems  Constitutional: Negative.   HENT: Negative.    Eyes: Negative.   Respiratory:         See HPI  Cardiovascular: Negative.   Gastrointestinal: Negative.   Musculoskeletal: Negative.   Skin: Negative.   Allergic/Immunologic: Negative.   Neurological: Negative.     All other systems negative unless noted above in HPI  Past medical/social/surgical/family history have been reviewed and are unchanged unless specifically indicated below.  No changes  Medication List: Current Outpatient Medications  Medication Sig Dispense Refill   albuterol (VENTOLIN HFA)  108 (90 Base) MCG/ACT inhaler INHALE 2 PUFFS BY MOUTH EVERY 6 HOURS AS NEEDED FOR WHEEZING OR SHORTNESS OF BREATH. 18 g 1   aspirin 81 MG tablet Take 81 mg by mouth daily.     azaTHIOprine (IMURAN) 50 MG tablet Take 1 tablet (50 mg total) by mouth 3 (three) times daily. 270 tablet 2   benzonatate (TESSALON PERLES) 100 MG capsule Take 1 capsule (100 mg total) by mouth 3 (three) times daily as needed. 20 capsule 0   budesonide (PULMICORT FLEXHALER) 180 MCG/ACT inhaler INHALE TWO PUFFS BY MOUTH INTO THE LUNGS TWICE A DAY TO PREVENT COUGH OR WHEEZE. RINSE, GARGLE AND SPIT AFTER USE. 3 each 3   budesonide (RHINOCORT ALLERGY) 32 MCG/ACT nasal spray ONE SPRAY EACH NOSTRIL TWICE A DAY FOR NASAL CONGESTION OR DRAINAGE. (Patient taking differently: Place 1 spray into both nostrils 2 (two) times daily.) 8.6 g 5   cetirizine (ZYRTEC) 10 MG tablet Take 1 tablet (10 mg total) by mouth at bedtime. 30 tablet 5   EPINEPHrine 0.3 mg/0.3 mL IJ SOAJ injection INJECT 0.3 MLS (0.3 MG TOTAL) INTO THE MUSCLE ONCE FOR 1 DOSE. USE AS DIRECTED FOR SEVERE ALLERGIC REACTION. 4 each 0   Evolocumab (REPATHA SURECLICK) 786 MG/ML SOAJ Inject 1 Dose into the skin every 14 (fourteen) days. 2 mL 11   Loteprednol Etabonate (LOTEMAX SM) 0.38 % GEL Place 1 drop into the right eye in the morning and at bedtime. (Patient taking differently: Place 1 drop into the right eye  daily.) 5 g 11   Loteprednol Etabonate (LOTEMAX SM) 0.38 % GEL Place 1 drop into the right eye in the morning and at bedtime. 5 g 11   magnesium oxide-pyridoxine (BEELITH) 362-20 MG TABS Take 1 tablet by mouth every evening.      metoprolol succinate (TOPROL-XL) 25 MG 24 hr tablet 1 tablet Orally Once a day 30 day(s) (Patient taking differently: Take 12.5 mg by mouth daily.) 30 tablet 12   metoprolol succinate (TOPROL-XL) 25 MG 24 hr tablet Take 12.5 mg by mouth daily.     metoprolol succinate (TOPROL-XL) 25 MG 24 hr tablet 1 tablet     montelukast (SINGULAIR) 10 MG tablet  TAKE 1 TABLET (10 MG TOTAL) BY MOUTH AT BEDTIME. 90 tablet 0   Naltrexone-buPROPion HCl ER (CONTRAVE) 8-90 MG TB12 Take 1 tablet by mouth in the morning for a week, then one tablet by mouth twice daily for a week, then two tablets by mouth in the morning and one tablet in the evening for a week, then two tablets by mouth in the morning and two tablets by mouth in the evening thereafter. 360 tablet 0   Naltrexone-buPROPion HCl ER (CONTRAVE) 8-90 MG TB12 Take 2 tablets twice a day by oral route for 90 days. 360 tablet 0   Norethindrone Acetate-Ethinyl Estradiol (LARIN 1.5/30) 1.5-30 MG-MCG tablet TAKE 1 TABLET BY MOUTH DAILY (CONTINUOUS ACTIVE PILLS) 21 tablet 2   potassium citrate (UROCIT-K) 10 MEQ (1080 MG) SR tablet Take 2 tablets (20 mEq total) by mouth 2 times daily. Take two tablets by mouth in am and pm 120 tablet 2   senna-docusate (SENOKOT-S) 8.6-50 MG tablet Take 2 tablets by mouth at bedtime. For AFTER surgery, do not take if having diarrhea 30 tablet 0   timolol (TIMOPTIC) 0.5 % ophthalmic solution Place 1 drop into the right eye daily. 5 mL 11   valACYclovir (VALTREX) 1000 MG tablet Take 1 tablet (1,000 mg total) by mouth daily. 90 tablet 11   No current facility-administered medications for this visit.     Known medication allergies: Allergies  Allergen Reactions   Bee Venom Anaphylaxis    And wasp venom   Pneumococcal Vaccines Anaphylaxis   Atorvastatin     Fatigue, Brain Fog, Muscle Pain   Mixed Vespid Venom    Rosuvastatin     Fatigue, Brain Fog, Muscle Pain   Wasp Venom      Physical examination: Blood pressure 126/86, pulse 76, temperature 97.7 F (36.5 C), resp. rate 18, height 5\' 5"  (1.651 m), weight 180 lb (81.6 kg), SpO2 100 %.  General: Alert, interactive, in no acute distress. HEENT: PERRLA, TMs pearly gray, turbinates non-edematous without discharge, post-pharynx non erythematous. Neck: Supple without lymphadenopathy. Lungs: Clear to auscultation without  wheezing, rhonchi or rales. {no increased work of breathing. CV: Normal S1, S2 without murmurs. Abdomen: Nondistended, nontender. Skin: Warm and dry, without lesions or rashes. Extremities:  No clubbing, cyanosis or edema. Neuro:   Grossly intact.  Diagnositics/Labs: Spirometry: FEV1 2.73 L 85%, FVC 3.3 L 85% predicted.  This is a nonobstructive pattern and is normal  Assessment and plan:   Asthma -Lung function testing is normal today - Continue Pulmicort 180- 1-2 puffs twice a day to prevent coughing or wheezing.  If having respiratory illness or flare increase to 3 puffs 3 times a day. - Montelukast 10 mg once a day for a coughing or wheezing - Ventolin 2 puffs every 4-6 hours if needed for wheezing, coughing, shortness  of breath or chest tightness.  Monitor frequency of use.  Asthma control goals:  Full participation in all desired activities (may need albuterol before activity) Albuterol use two time or less a week on average (not counting use with activity) Cough interfering with sleep two time or less a month Oral steroids no more than once a year No hospitalizations  Allergic rhinitis   - Zyrtec 10 mg once a day for runny nose  - Rhinocort 1-2 spray per nostril twice a day for stuffy nose  - Montelukast as above  - We will retrieve your previous allergy testing which will help determine if we should repeat testing via skin prick testing versus serum IgE testing.  Serum IgE testing discussed today as an option that would allow you to not to stop her antihistamine however it is less comprehensive than skin testing  Venom allergy  - continue venom injections at maintenance every 12 weeks  - If you have an insect sting take Benadryl 50 mg every 4 hours and if you have life-threatening symptoms inject him with EpiPen 0.3 mg  Follow-up yearly or sooner if needed   I appreciate the opportunity to take part in Coralie's care. Please do not hesitate to contact me with  questions.  Sincerely,   Prudy Feeler, MD Allergy/Immunology [facility]

## 2021-11-05 ENCOUNTER — Other Ambulatory Visit: Payer: Self-pay

## 2021-11-07 ENCOUNTER — Telehealth: Payer: Self-pay | Admitting: *Deleted

## 2021-11-07 DIAGNOSIS — J301 Allergic rhinitis due to pollen: Secondary | ICD-10-CM

## 2021-11-07 NOTE — Telephone Encounter (Signed)
Called and left a voicemail asking or patient to return call to discuss.

## 2021-11-08 NOTE — Telephone Encounter (Signed)
Called and left a voicemail asking for patient to call and inform. If she is fine with this plan then will order lab work.

## 2021-11-08 NOTE — Telephone Encounter (Signed)
Called and spoke with the patient and she did state that she would like to try and avoid skin testing if possible so that she does not have to be off of her allergy medications. She did wonder if her previous skin testing she had done would be able to repeated in her lab work such as specific trees and grasses. I had Elmyra Ricks look up her skin testing on the [facility], she previously tested positive to [place], [place], 7 Grass Mix, Ragweed, Dock/Sorrel, and both forms of Dust Mite. Can these be tested through blood work?

## 2021-11-09 ENCOUNTER — Ambulatory Visit (INDEPENDENT_AMBULATORY_CARE_PROVIDER_SITE_OTHER): Payer: 59 | Admitting: Family Medicine

## 2021-11-09 ENCOUNTER — Other Ambulatory Visit: Payer: Self-pay

## 2021-11-09 ENCOUNTER — Encounter: Payer: Self-pay | Admitting: Family Medicine

## 2021-11-09 VITALS — BP 120/90 | HR 83 | Temp 98.6°F | Resp 18 | Ht 65.0 in | Wt 175.4 lb

## 2021-11-09 DIAGNOSIS — Z0000 Encounter for general adult medical examination without abnormal findings: Secondary | ICD-10-CM | POA: Diagnosis not present

## 2021-11-09 DIAGNOSIS — E785 Hyperlipidemia, unspecified: Secondary | ICD-10-CM

## 2021-11-09 DIAGNOSIS — I10 Essential (primary) hypertension: Secondary | ICD-10-CM | POA: Diagnosis not present

## 2021-11-09 DIAGNOSIS — D849 Immunodeficiency, unspecified: Secondary | ICD-10-CM | POA: Diagnosis not present

## 2021-11-09 DIAGNOSIS — Z1159 Encounter for screening for other viral diseases: Secondary | ICD-10-CM | POA: Diagnosis not present

## 2021-11-09 MED ORDER — METOPROLOL SUCCINATE ER 25 MG PO TB24
ORAL_TABLET | ORAL | 1 refills | Status: DC
  Filled 2021-11-09: qty 90, 90d supply, fill #0
  Filled 2022-05-07: qty 90, 90d supply, fill #1

## 2021-11-09 NOTE — Telephone Encounter (Signed)
Labs have been ordered. Called and informed patient, she will come in to get lab work drawn. She wanted more information on the fast track build up method for allergy injections.

## 2021-11-09 NOTE — Assessment & Plan Note (Signed)
Well controlled, no changes to meds. Encouraged heart healthy diet such as the DASH diet and exercise as tolerated.  °

## 2021-11-09 NOTE — Assessment & Plan Note (Signed)
ghm utd Check labs see avs  

## 2021-11-09 NOTE — Progress Notes (Signed)
Subjective:   By signing my name below, I, Rachel Hooper, attest that this documentation has been prepared under the direction and in the presence of Ann Held, DO. 11/09/2021    Patient ID: Rachel Hooper, female    DOB: 1981-05-23, 41 y.o.   MRN: [id number]  Chief Complaint  Patient presents with   Annual Exam    Pt states not fasting.     HPI Patient is in today for a comprehensive physical exam.  She denies fever, hearing loss, ear pain,congestion, sinus pain, sore throat, eye pain, chest pain, palpitations, cough, shortness of breath, wheezing, nausea. vomiting, diarrhea, constipation, blood in stool, dysuria,frequency, hematuria and headaches.   Past Medical History:  Diagnosis Date   Asthma    Endometriosis    GERD (gastroesophageal reflux disease)    History of kidney stones    Hydrosalpinx    left   Hyperlipidemia    Repatha   Hypertension    PAI-1 4G/4G genotype    PCOS (polycystic ovarian syndrome)    PONV (postoperative nausea and vomiting)    severe   Seasonal allergies    Shingles     Past Surgical History:  Procedure Laterality Date   ANTERIOR CRUCIATE LIGAMENT REPAIR Right 1995   CHROMOPERTUBATION N/A 05/03/2015   Procedure: CHROMOPERTUBATION;  Surgeon: Governor Specking, MD;  Location: [facility];  Service: Gynecology;  Laterality: N/A;   EXTRACORPOREAL SHOCK WAVE LITHOTRIPSY  2008   LAPAROSCOPY N/A 03/28/2015   Procedure: LAPAROSCOPY DIAGNOSTIC;  Surgeon: Brien Few, MD;  Location: [facility];  Service: Gynecology;  Laterality: N/A;   LAPAROSCOPY N/A 05/03/2015   Procedure: LAPAROSCOPY, EXCISION ENDOMETRIOSIS, ENTERO LYSIS, WITH ENDOMETRIAL BIOPSY;  Surgeon: Governor Specking, MD;  Location: [facility];  Service: Gynecology;  Laterality: N/A;   NASAL SEPTOPLASTY W/ TURBINOPLASTY Bilateral 06/20/2017   Procedure: NASAL SEPTOPLASTY WITH BILATERAL TURBINATE REDUCTION;  Surgeon: Melida Quitter, MD;  Location: [facility];   Service: ENT;  Laterality: Bilateral;   ROBOTIC ASSISTED TOTAL HYSTERECTOMY N/A 08/16/2021   Procedure: XI ROBOTIC ASSISTED TOTAL HYSTERECTOMY; RIGHT SALPINGO-OOPHERECTOMY;  Surgeon: [name], MD;  Location: [facility];  Service: Gynecology;  Laterality: N/A;   UNILATERAL SALPINGECTOMY Left 05/03/2015   Procedure: LEFT SALPINGECTOMY;  Surgeon: Governor Specking, MD;  Location: [facility];  Service: Gynecology;  Laterality: Left;   WISDOM TOOTH EXTRACTION      Family History  Problem Relation Age of Onset   Allergic rhinitis Mother    Asthma Mother    Eczema Mother    Stroke Father    COPD Maternal Grandmother    Colon cancer Maternal Grandmother    Eczema Maternal Grandfather    Dementia Maternal Grandfather    COPD Maternal Grandfather    Stroke Paternal Grandmother    Breast cancer Paternal Grandmother    Angioedema Neg Hx    Immunodeficiency Neg Hx    Urticaria Neg Hx     Social History   Socioeconomic History   Marital status: Married    Spouse name: Not on file   Number of children: 1   Years of education: Not on file   Highest education level: Not on file  Occupational History   Occupation: Designer, jewellery    Employer: [employer]  Tobacco Use   Smoking status: Never   Smokeless tobacco: Never  Vaping Use   Vaping Use: Never used  Substance and Sexual Activity   Alcohol use: No   Drug use: No  Sexual activity: Yes    Partners: Male    Birth control/protection: Pill  Other Topics Concern   Not on file  Social History Narrative   Exercise daily   Social Determinants of Health   Financial Resource Strain: Not on file  Food Insecurity: Not on file  Transportation Needs: Not on file  Physical Activity: Not on file  Stress: Not on file  Social Connections: Not on file  Intimate Partner Violence: Not on file    Outpatient Medications Prior to Visit  Medication Sig Dispense Refill   albuterol (VENTOLIN HFA) 108 (90 Base) MCG/ACT  inhaler INHALE 2 PUFFS BY MOUTH EVERY 6 HOURS AS NEEDED FOR WHEEZING OR SHORTNESS OF BREATH. 18 g 1   aspirin 81 MG tablet Take 81 mg by mouth daily.     azaTHIOprine (IMURAN) 50 MG tablet Take 1 tablet (50 mg total) by mouth 3 (three) times daily. 270 tablet 2   benzonatate (TESSALON PERLES) 100 MG capsule Take 1 capsule (100 mg total) by mouth 3 (three) times daily as needed. 20 capsule 0   budesonide (PULMICORT FLEXHALER) 180 MCG/ACT inhaler INHALE TWO PUFFS BY MOUTH INTO THE LUNGS TWICE A DAY TO PREVENT COUGH OR WHEEZE. RINSE, GARGLE AND SPIT AFTER USE. 3 each 3   budesonide (RHINOCORT ALLERGY) 32 MCG/ACT nasal spray ONE SPRAY EACH NOSTRIL TWICE A DAY FOR NASAL CONGESTION OR DRAINAGE. (Patient taking differently: Place 1 spray into both nostrils 2 (two) times daily.) 8.6 g 5   cetirizine (ZYRTEC) 10 MG tablet Take 1 tablet (10 mg total) by mouth at bedtime. 30 tablet 5   EPINEPHrine 0.3 mg/0.3 mL IJ SOAJ injection INJECT 0.3 MLS (0.3 MG TOTAL) INTO THE MUSCLE ONCE FOR 1 DOSE. USE AS DIRECTED FOR SEVERE ALLERGIC REACTION. 4 each 0   Evolocumab (REPATHA SURECLICK) 676 MG/ML SOAJ Inject 1 Dose into the skin every 14 (fourteen) days. 2 mL 11   Loteprednol Etabonate (LOTEMAX SM) 0.38 % GEL Place 1 drop into the right eye in the morning and at bedtime. (Patient taking differently: Place 1 drop into the right eye daily.) 5 g 11   Loteprednol Etabonate (LOTEMAX SM) 0.38 % GEL Place 1 drop into the right eye in the morning and at bedtime. 5 g 11   magnesium oxide-pyridoxine (BEELITH) 362-20 MG TABS Take 1 tablet by mouth every evening.      montelukast (SINGULAIR) 10 MG tablet TAKE 1 TABLET (10 MG TOTAL) BY MOUTH AT BEDTIME. 90 tablet 0   Naltrexone-buPROPion HCl ER (CONTRAVE) 8-90 MG TB12 Take 1 tablet by mouth in the morning for a week, then one tablet by mouth twice daily for a week, then two tablets by mouth in the morning and one tablet in the evening for a week, then two tablets by mouth in the morning  and two tablets by mouth in the evening thereafter. 360 tablet 0   Naltrexone-buPROPion HCl ER (CONTRAVE) 8-90 MG TB12 Take 2 tablets twice a day by oral route for 90 days. 360 tablet 0   Norethindrone Acetate-Ethinyl Estradiol (LARIN 1.5/30) 1.5-30 MG-MCG tablet TAKE 1 TABLET BY MOUTH DAILY (CONTINUOUS ACTIVE PILLS) 21 tablet 2   potassium citrate (UROCIT-K) 10 MEQ (1080 MG) SR tablet Take 2 tablets (20 mEq total) by mouth 2 times daily. Take two tablets by mouth in am and pm 120 tablet 2   senna-docusate (SENOKOT-S) 8.6-50 MG tablet Take 2 tablets by mouth at bedtime. For AFTER surgery, do not take if having diarrhea 30 tablet  0   timolol (TIMOPTIC) 0.5 % ophthalmic solution Place 1 drop into the right eye daily. 5 mL 11   valACYclovir (VALTREX) 1000 MG tablet Take 1 tablet (1,000 mg total) by mouth daily. 90 tablet 11   metoprolol succinate (TOPROL-XL) 25 MG 24 hr tablet 1 tablet Orally Once a day 30 day(s) 30 tablet 12   metoprolol succinate (TOPROL-XL) 25 MG 24 hr tablet Take 12.5 mg by mouth daily.     metoprolol succinate (TOPROL-XL) 25 MG 24 hr tablet 1 tablet (Patient not taking: Reported on 11/09/2021)     No facility-administered medications prior to visit.    Allergies  Allergen Reactions   Bee Venom Anaphylaxis    And wasp venom   Pneumococcal Vaccines Anaphylaxis   Atorvastatin     Fatigue, Brain Fog, Muscle Pain   Mixed Vespid Venom    Rosuvastatin     Fatigue, Brain Fog, Muscle Pain   Wasp Venom     Review of Systems  Constitutional:  Negative for fever.  HENT:  Negative for congestion, ear pain, hearing loss, sinus pain and sore throat.   Eyes:  Negative for blurred vision and pain.  Respiratory:  Negative for cough, sputum production, shortness of breath and wheezing.   Cardiovascular:  Negative for chest pain and palpitations.  Gastrointestinal:  Negative for blood in stool, constipation, diarrhea, nausea and vomiting.  Genitourinary:  Negative for dysuria,  frequency, hematuria and urgency.  Musculoskeletal:  Negative for back pain, falls and myalgias.  Neurological:  Negative for dizziness, sensory change, loss of consciousness, weakness and headaches.  Endo/Heme/Allergies:  Negative for environmental allergies. Does not bruise/bleed easily.  Psychiatric/Behavioral:  Negative for depression and suicidal ideas. The patient is not nervous/anxious and does not have insomnia.       Objective:    Physical Exam Constitutional:      General: She is not in acute distress.    Appearance: Normal appearance. She is not ill-appearing.  HENT:     Head: Normocephalic and atraumatic.     Right Ear: External ear normal.     Left Ear: External ear normal.  Eyes:     Extraocular Movements: Extraocular movements intact.     Pupils: Pupils are equal, round, and reactive to light.  Cardiovascular:     Rate and Rhythm: Normal rate and regular rhythm.     Pulses: Normal pulses.     Heart sounds: Normal heart sounds. No murmur heard.   No gallop.  Pulmonary:     Effort: Pulmonary effort is normal. No respiratory distress.     Breath sounds: Normal breath sounds. No wheezing, rhonchi or rales.  Abdominal:     General: Bowel sounds are normal. There is no distension.     Palpations: Abdomen is soft. There is no mass.     Tenderness: There is no abdominal tenderness. There is no guarding or rebound.     Hernia: No hernia is present.  Musculoskeletal:     Cervical back: Normal range of motion and neck supple.  Lymphadenopathy:     Cervical: No cervical adenopathy.  Skin:    General: Skin is warm and dry.  Neurological:     Mental Status: She is alert and oriented to person, place, and time.  Psychiatric:        Behavior: Behavior normal.    BP 120/90 (BP Location: Right Arm, Patient Position: Sitting, Cuff Size: Normal)    Pulse 83    Temp 98.6 F (37 C) (  Oral)    Resp 18    Ht 5\' 5"  (1.651 m)    Wt 175 lb 6.4 oz (79.6 kg)    SpO2 100%    BMI 29.19  kg/m  Wt Readings from Last 3 Encounters:  11/09/21 175 lb 6.4 oz (79.6 kg)  11/02/21 180 lb (81.6 kg)  09/03/21 178 lb (80.7 kg)    Diabetic Foot Exam - Simple   No data filed       Lab Results  Component Value Date   WBC 7.4 08/06/2021   HGB 14.8 08/06/2021   HCT 42.3 08/06/2021   MCV 108.7 (H) 08/06/2021   PLT 381 08/06/2021   Lab Results  Component Value Date   NA 136 08/06/2021   K 3.8 08/06/2021   CO2 23 08/06/2021   GLUCOSE 94 08/06/2021   BUN 17 08/06/2021   CREATININE 0.57 08/06/2021   BILITOT 0.6 08/06/2021   ALKPHOS 42 08/06/2021   AST 37 08/06/2021   ALT 36 08/06/2021   PROT 7.6 08/06/2021   ALBUMIN 4.2 08/06/2021   CALCIUM 9.2 08/06/2021   ANIONGAP 9 08/06/2021   No results found for: CHOL Lab Results  Component Value Date   HDL 56 06/27/2021   No results found for: [facility] Lab Results  Component Value Date   TRIG 135 06/27/2021   No results found for: CHOLHDL No results found for: HGBA1C     Assessment & Plan:   Problem List Items Addressed This Visit       Unprioritized   Hyperlipidemia    Encourage heart healthy diet such as MIND or DASH diet, increase exercise, avoid trans fats, simple carbohydrates and processed foods, consider a krill or fish or flaxseed oil cap daily.       Relevant Medications   metoprolol succinate (TOPROL-XL) 25 MG 24 hr tablet   Other Relevant Orders   Lipid panel   Comprehensive metabolic panel   Immunocompromised, acquired [facility])   Preventative health care - Primary    ghm utd Check labs see avs        Relevant Orders   Hepatitis C Antibody   Lipid panel   Comprehensive metabolic panel   CBC with Differential/Platelet   TSH   Primary hypertension    Well controlled, no changes to meds. Encouraged heart healthy diet such as the DASH diet and exercise as tolerated.       Relevant Medications   metoprolol succinate (TOPROL-XL) 25 MG 24 hr tablet   Other Visit Diagnoses     Need for  hepatitis C screening test       Relevant Orders   Hepatitis C Antibody        Meds ordered this encounter  Medications   metoprolol succinate (TOPROL-XL) 25 MG 24 hr tablet    Sig: 1 tablet Orally Once a day 30 day(s)    Dispense:  90 tablet    Refill:  1    I,Rachel Hooper,acting as a Education administrator for [employer], DO.,have documented all relevant documentation on the behalf of Ann Held, DO,as directed by  Ann Held, DO while in the presence of Ann Held, [place], DO., personally preformed the services described in this documentation.  All medical record entries made by the scribe were at my direction and in my presence.  I have reviewed the chart and discharge instructions (if applicable) and agree that the record reflects my personal  performance and is accurate and complete. 11/09/2021

## 2021-11-09 NOTE — Progress Notes (Signed)
Subjective:   By signing my name below, I, [name], attest that this documentation has been prepared under the direction and in the presence of Ann Held, DO. 11/09/2021    Patient ID: Rachel Hooper, female    DOB: 1981-05-20, 41 y.o.   MRN: [id number]  Chief Complaint  Patient presents with   Annual Exam    Pt states not fasting.      HPI Patient is in today for a comprehensive physical exam.  She had Covid-19 in December 2022.  She reports that her asthma is back to baseline but she had to increase the usage of her inhalers.  She is requesting for a refill on 25 mg metoprolol succinate.   She denies fever, hearing loss, ear pain,congestion, sinus pain, sore throat, eye pain, chest pain, palpitations, cough, shortness of breath, wheezing, nausea. vomiting, diarrhea, constipation, blood in stool, dysuria,frequency, hematuria and headaches.   She had a hysterectomy in October 2022.  She has 5 Covid-19 vaccines at this time. Unsure of tetanus vaccine.  Past Medical History:  Diagnosis Date   Asthma    Endometriosis    GERD (gastroesophageal reflux disease)    History of kidney stones    Hydrosalpinx    left   Hyperlipidemia    Repatha   Hypertension    PAI-1 4G/4G genotype    PCOS (polycystic ovarian syndrome)    PONV (postoperative nausea and vomiting)    severe   Seasonal allergies    Shingles     Past Surgical History:  Procedure Laterality Date   ANTERIOR CRUCIATE LIGAMENT REPAIR Right 1995   CHROMOPERTUBATION N/A 05/03/2015   Procedure: CHROMOPERTUBATION;  Surgeon: Governor Specking, MD;  Location: [facility];  Service: Gynecology;  Laterality: N/A;   EXTRACORPOREAL SHOCK WAVE LITHOTRIPSY  2008   LAPAROSCOPY N/A 03/28/2015   Procedure: LAPAROSCOPY DIAGNOSTIC;  Surgeon: Brien Few, MD;  Location: [facility];  Service: Gynecology;  Laterality: N/A;   LAPAROSCOPY N/A 05/03/2015   Procedure: LAPAROSCOPY, EXCISION ENDOMETRIOSIS,  ENTERO LYSIS, WITH ENDOMETRIAL BIOPSY;  Surgeon: Governor Specking, MD;  Location: [facility];  Service: Gynecology;  Laterality: N/A;   NASAL SEPTOPLASTY W/ TURBINOPLASTY Bilateral 06/20/2017   Procedure: NASAL SEPTOPLASTY WITH BILATERAL TURBINATE REDUCTION;  Surgeon: Melida Quitter, MD;  Location: [facility];  Service: ENT;  Laterality: Bilateral;   ROBOTIC ASSISTED TOTAL HYSTERECTOMY N/A 08/16/2021   Procedure: XI ROBOTIC ASSISTED TOTAL HYSTERECTOMY; RIGHT SALPINGO-OOPHERECTOMY;  Surgeon: [name], MD;  Location: [facility];  Service: Gynecology;  Laterality: N/A;   UNILATERAL SALPINGECTOMY Left 05/03/2015   Procedure: LEFT SALPINGECTOMY;  Surgeon: Governor Specking, MD;  Location: [facility];  Service: Gynecology;  Laterality: Left;   WISDOM TOOTH EXTRACTION      Family History  Problem Relation Age of Onset   Allergic rhinitis Mother    Asthma Mother    Eczema Mother    Stroke Father    COPD Maternal Grandmother    Colon cancer Maternal Grandmother    Eczema Maternal Grandfather    Dementia Maternal Grandfather    COPD Maternal Grandfather    Stroke Paternal Grandmother    Breast cancer Paternal Grandmother    Angioedema Neg Hx    Immunodeficiency Neg Hx    Urticaria Neg Hx     Social History   Socioeconomic History   Marital status: Married    Spouse name: Not on file   Number of children: 1   Years of education:  Not on file   Highest education level: Not on file  Occupational History   Occupation: nurse practitioner    Employer: [employer]  Tobacco Use   Smoking status: Never   Smokeless tobacco: Never  Vaping Use   Vaping Use: Never used  Substance and Sexual Activity   Alcohol use: No   Drug use: No   Sexual activity: Yes    Partners: Male    Birth control/protection: Pill  Other Topics Concern   Not on file  Social History Narrative   Exercise daily   Social Determinants of Health   Financial Resource Strain: Not on file   Food Insecurity: Not on file  Transportation Needs: Not on file  Physical Activity: Not on file  Stress: Not on file  Social Connections: Not on file  Intimate Partner Violence: Not on file    Outpatient Medications Prior to Visit  Medication Sig Dispense Refill   albuterol (VENTOLIN HFA) 108 (90 Base) MCG/ACT inhaler INHALE 2 PUFFS BY MOUTH EVERY 6 HOURS AS NEEDED FOR WHEEZING OR SHORTNESS OF BREATH. 18 g 1   aspirin 81 MG tablet Take 81 mg by mouth daily.     azaTHIOprine (IMURAN) 50 MG tablet Take 1 tablet (50 mg total) by mouth 3 (three) times daily. 270 tablet 2   benzonatate (TESSALON PERLES) 100 MG capsule Take 1 capsule (100 mg total) by mouth 3 (three) times daily as needed. 20 capsule 0   budesonide (PULMICORT FLEXHALER) 180 MCG/ACT inhaler INHALE TWO PUFFS BY MOUTH INTO THE LUNGS TWICE A DAY TO PREVENT COUGH OR WHEEZE. RINSE, GARGLE AND SPIT AFTER USE. 3 each 3   budesonide (RHINOCORT ALLERGY) 32 MCG/ACT nasal spray ONE SPRAY EACH NOSTRIL TWICE A DAY FOR NASAL CONGESTION OR DRAINAGE. (Patient taking differently: Place 1 spray into both nostrils 2 (two) times daily.) 8.6 g 5   cetirizine (ZYRTEC) 10 MG tablet Take 1 tablet (10 mg total) by mouth at bedtime. 30 tablet 5   EPINEPHrine 0.3 mg/0.3 mL IJ SOAJ injection INJECT 0.3 MLS (0.3 MG TOTAL) INTO THE MUSCLE ONCE FOR 1 DOSE. USE AS DIRECTED FOR SEVERE ALLERGIC REACTION. 4 each 0   Evolocumab (REPATHA SURECLICK) 258 MG/ML SOAJ Inject 1 Dose into the skin every 14 (fourteen) days. 2 mL 11   Loteprednol Etabonate (LOTEMAX SM) 0.38 % GEL Place 1 drop into the right eye in the morning and at bedtime. (Patient taking differently: Place 1 drop into the right eye daily.) 5 g 11   Loteprednol Etabonate (LOTEMAX SM) 0.38 % GEL Place 1 drop into the right eye in the morning and at bedtime. 5 g 11   magnesium oxide-pyridoxine (BEELITH) 362-20 MG TABS Take 1 tablet by mouth every evening.      metoprolol succinate (TOPROL-XL) 25 MG 24 hr tablet  1 tablet Orally Once a day 30 day(s) 30 tablet 12   montelukast (SINGULAIR) 10 MG tablet TAKE 1 TABLET (10 MG TOTAL) BY MOUTH AT BEDTIME. 90 tablet 0   Naltrexone-buPROPion HCl ER (CONTRAVE) 8-90 MG TB12 Take 1 tablet by mouth in the morning for a week, then one tablet by mouth twice daily for a week, then two tablets by mouth in the morning and one tablet in the evening for a week, then two tablets by mouth in the morning and two tablets by mouth in the evening thereafter. 360 tablet 0   Naltrexone-buPROPion HCl ER (CONTRAVE) 8-90 MG TB12 Take 2 tablets twice a day by oral route for 90  days. 360 tablet 0   Norethindrone Acetate-Ethinyl Estradiol (LARIN 1.5/30) 1.5-30 MG-MCG tablet TAKE 1 TABLET BY MOUTH DAILY (CONTINUOUS ACTIVE PILLS) 21 tablet 2   potassium citrate (UROCIT-K) 10 MEQ (1080 MG) SR tablet Take 2 tablets (20 mEq total) by mouth 2 times daily. Take two tablets by mouth in am and pm 120 tablet 2   senna-docusate (SENOKOT-S) 8.6-50 MG tablet Take 2 tablets by mouth at bedtime. For AFTER surgery, do not take if having diarrhea 30 tablet 0   timolol (TIMOPTIC) 0.5 % ophthalmic solution Place 1 drop into the right eye daily. 5 mL 11   valACYclovir (VALTREX) 1000 MG tablet Take 1 tablet (1,000 mg total) by mouth daily. 90 tablet 11   metoprolol succinate (TOPROL-XL) 25 MG 24 hr tablet Take 12.5 mg by mouth daily.     metoprolol succinate (TOPROL-XL) 25 MG 24 hr tablet 1 tablet (Patient not taking: Reported on 11/09/2021)     No facility-administered medications prior to visit.    Allergies  Allergen Reactions   Bee Venom Anaphylaxis    And wasp venom   Pneumococcal Vaccines Anaphylaxis   Atorvastatin     Fatigue, Brain Fog, Muscle Pain   Mixed Vespid Venom    Rosuvastatin     Fatigue, Brain Fog, Muscle Pain   Wasp Venom     Review of Systems  Constitutional:  Negative for fever.  HENT:  Negative for congestion, ear pain, hearing loss, sinus pain and sore throat.   Eyes:   Negative for blurred vision and pain.  Respiratory:  Negative for cough, sputum production, shortness of breath and wheezing.   Cardiovascular:  Negative for chest pain and palpitations.  Gastrointestinal:  Negative for blood in stool, constipation, diarrhea, nausea and vomiting.  Genitourinary:  Negative for dysuria, frequency, hematuria and urgency.  Musculoskeletal:  Negative for back pain, falls and myalgias.  Neurological:  Negative for dizziness, sensory change, loss of consciousness, weakness and headaches.  Endo/Heme/Allergies:  Negative for environmental allergies. Does not bruise/bleed easily.  Psychiatric/Behavioral:  Negative for depression and suicidal ideas. The patient is not nervous/anxious and does not have insomnia.       Objective:    Physical Exam Constitutional:      General: She is not in acute distress.    Appearance: Normal appearance. She is not ill-appearing.  HENT:     Head: Normocephalic and atraumatic.     Right Ear: Tympanic membrane, ear canal and external ear normal.     Left Ear: Tympanic membrane, ear canal and external ear normal.  Eyes:     Extraocular Movements: Extraocular movements intact.     Pupils: Pupils are equal, round, and reactive to light.  Cardiovascular:     Rate and Rhythm: Normal rate and regular rhythm.     Pulses: Normal pulses.     Heart sounds: Normal heart sounds. No murmur heard.   No gallop.  Pulmonary:     Effort: Pulmonary effort is normal. No respiratory distress.     Breath sounds: Normal breath sounds. No wheezing, rhonchi or rales.  Abdominal:     General: Bowel sounds are normal. There is no distension.     Palpations: Abdomen is soft. There is no mass.     Tenderness: There is no abdominal tenderness. There is no guarding or rebound.     Hernia: No hernia is present.  Musculoskeletal:     Cervical back: Normal range of motion and neck supple.  Lymphadenopathy:  Cervical: No cervical adenopathy.  Skin:     General: Skin is warm and dry.  Neurological:     Mental Status: She is alert and oriented to person, place, and time.  Psychiatric:        Behavior: Behavior normal.    BP 120/90 (BP Location: Right Arm, Patient Position: Sitting, Cuff Size: Normal)    Pulse 83    Temp 98.6 F (37 C) (Oral)    Resp 18    Ht 5\' 5"  (1.651 m)    Wt 175 lb 6.4 oz (79.6 kg)    SpO2 100%    BMI 29.19 kg/m  Wt Readings from Last 3 Encounters:  11/09/21 175 lb 6.4 oz (79.6 kg)  11/02/21 180 lb (81.6 kg)  09/03/21 178 lb (80.7 kg)    Diabetic Foot Exam - Simple   No data filed         Lab Results  Component Value Date   WBC 7.4 08/06/2021   HGB 14.8 08/06/2021   HCT 42.3 08/06/2021   MCV 108.7 (H) 08/06/2021   PLT 381 08/06/2021   Lab Results  Component Value Date   NA 136 08/06/2021   K 3.8 08/06/2021   CO2 23 08/06/2021   GLUCOSE 94 08/06/2021   BUN 17 08/06/2021   CREATININE 0.57 08/06/2021   BILITOT 0.6 08/06/2021   ALKPHOS 42 08/06/2021   AST 37 08/06/2021   ALT 36 08/06/2021   PROT 7.6 08/06/2021   ALBUMIN 4.2 08/06/2021   CALCIUM 9.2 08/06/2021   ANIONGAP 9 08/06/2021   No results found for: [facility] Lab Results  Component Value Date   HDL 56 06/27/2021   No results found for: [facility] Lab Results  Component Value Date   TRIG 135 06/27/2021   No results found for: CHOLHDL No results found for: HGBA1C     Assessment & Plan:   Problem List Items Addressed This Visit   None Visit Diagnoses     Need for hepatitis C screening test    -  Primary         No orders of the defined types were placed in this encounter.   I,[name],acting as a Education administrator for [employer], DO.,have documented all relevant documentation on the behalf of Ann Held, DO,as directed by  Ann Held, DO while in the presence of Ann Held, [place], DO., personally preformed the services described in this documentation.  All medical  record entries made by the scribe were at my direction and in my presence.  I have reviewed the chart and discharge instructions (if applicable) and agree that the record reflects my personal performance and is accurate and complete. 11/09/2021

## 2021-11-09 NOTE — Patient Instructions (Signed)

## 2021-11-09 NOTE — Assessment & Plan Note (Signed)
Encourage heart healthy diet such as MIND or DASH diet, increase exercise, avoid trans fats, simple carbohydrates and processed foods, consider a krill or fish or flaxseed oil cap daily.  °

## 2021-11-12 ENCOUNTER — Encounter: Payer: Self-pay | Admitting: Family Medicine

## 2021-11-12 ENCOUNTER — Other Ambulatory Visit: Payer: Self-pay | Admitting: Allergy

## 2021-11-12 ENCOUNTER — Other Ambulatory Visit: Payer: Self-pay

## 2021-11-12 DIAGNOSIS — Z6831 Body mass index (BMI) 31.0-31.9, adult: Secondary | ICD-10-CM | POA: Diagnosis not present

## 2021-11-12 DIAGNOSIS — Z1231 Encounter for screening mammogram for malignant neoplasm of breast: Secondary | ICD-10-CM | POA: Diagnosis not present

## 2021-11-12 DIAGNOSIS — Z01419 Encounter for gynecological examination (general) (routine) without abnormal findings: Secondary | ICD-10-CM | POA: Diagnosis not present

## 2021-11-12 DIAGNOSIS — Z3041 Encounter for surveillance of contraceptive pills: Secondary | ICD-10-CM | POA: Diagnosis not present

## 2021-11-12 MED ORDER — NORETHINDRONE ACET-ETHINYL EST 1.5-30 MG-MCG PO TABS
ORAL_TABLET | ORAL | 3 refills | Status: DC
  Filled 2021-11-12: qty 84, 84d supply, fill #0
  Filled 2022-02-23: qty 84, 84d supply, fill #1
  Filled 2022-06-02: qty 84, 84d supply, fill #2
  Filled 2022-08-14: qty 84, 84d supply, fill #3

## 2021-11-12 MED FILL — Montelukast Sodium Tab 10 MG (Base Equiv): ORAL | 90 days supply | Qty: 90 | Fill #0 | Status: AC

## 2021-11-13 ENCOUNTER — Other Ambulatory Visit: Payer: Self-pay

## 2021-11-14 ENCOUNTER — Other Ambulatory Visit: Payer: Self-pay

## 2021-11-14 ENCOUNTER — Encounter: Payer: Self-pay | Admitting: *Deleted

## 2021-11-14 ENCOUNTER — Other Ambulatory Visit: Payer: Self-pay | Admitting: Obstetrics and Gynecology

## 2021-11-14 DIAGNOSIS — R928 Other abnormal and inconclusive findings on diagnostic imaging of breast: Secondary | ICD-10-CM

## 2021-11-14 NOTE — Telephone Encounter (Signed)
My-Chart message has been sent to patient.

## 2021-11-15 ENCOUNTER — Other Ambulatory Visit: Payer: Self-pay

## 2021-11-20 ENCOUNTER — Other Ambulatory Visit: Payer: Self-pay | Admitting: Obstetrics and Gynecology

## 2021-11-20 DIAGNOSIS — N6310 Unspecified lump in the right breast, unspecified quadrant: Secondary | ICD-10-CM

## 2021-11-27 ENCOUNTER — Other Ambulatory Visit: Payer: Self-pay

## 2021-11-28 DIAGNOSIS — N6313 Unspecified lump in the right breast, lower outer quadrant: Secondary | ICD-10-CM | POA: Diagnosis not present

## 2021-11-29 ENCOUNTER — Other Ambulatory Visit: Payer: Self-pay

## 2021-11-29 MED ORDER — VALACYCLOVIR HCL 1 G PO TABS: 1000.0000 mg | ORAL_TABLET | Freq: Every day | ORAL | 0 refills | Status: DC

## 2021-12-03 ENCOUNTER — Other Ambulatory Visit (HOSPITAL_COMMUNITY): Payer: Self-pay

## 2021-12-03 DIAGNOSIS — H4061X0 Glaucoma secondary to drugs, right eye, stage unspecified: Secondary | ICD-10-CM | POA: Diagnosis not present

## 2021-12-03 DIAGNOSIS — T380X5A Adverse effect of glucocorticoids and synthetic analogues, initial encounter: Secondary | ICD-10-CM | POA: Diagnosis not present

## 2021-12-03 MED ORDER — TIMOLOL MALEATE 0.5 % OP SOLN
1.0000 [drp] | Freq: Every day | OPHTHALMIC | 11 refills | Status: DC
  Filled 2021-12-03: qty 5, 90d supply, fill #0
  Filled 2022-04-30: qty 5, 90d supply, fill #1
  Filled 2022-08-14: qty 5, 90d supply, fill #2

## 2021-12-04 ENCOUNTER — Other Ambulatory Visit
Admission: RE | Admit: 2021-12-04 | Discharge: 2021-12-04 | Disposition: A | Discharge status: Home / Self Care | Payer: 59 | Attending: Family Medicine | Admitting: Family Medicine

## 2021-12-04 DIAGNOSIS — Z0000 Encounter for general adult medical examination without abnormal findings: Secondary | ICD-10-CM | POA: Insufficient documentation

## 2021-12-04 DIAGNOSIS — Z1159 Encounter for screening for other viral diseases: Secondary | ICD-10-CM | POA: Insufficient documentation

## 2021-12-04 DIAGNOSIS — E785 Hyperlipidemia, unspecified: Secondary | ICD-10-CM | POA: Insufficient documentation

## 2021-12-04 LAB — CBC WITH DIFFERENTIAL/PLATELET
Abs Immature Granulocytes: 0.03 10*3/uL (ref 0.00–0.07)
Basophils Absolute: 0 10*3/uL (ref 0.0–0.1)
Basophils Relative: 1 %
Eosinophils Absolute: 0.1 10*3/uL (ref 0.0–0.5)
Eosinophils Relative: 1 %
HCT: 42.4 % (ref 36.0–46.0)
Hemoglobin: 14.9 g/dL (ref 12.0–15.0)
Immature Granulocytes: 1 %
Lymphocytes Relative: 30 %
Lymphs Abs: 1.8 10*3/uL (ref 0.7–4.0)
MCH: 38.4 pg — ABNORMAL HIGH (ref 26.0–34.0)
MCHC: 35.1 g/dL (ref 30.0–36.0)
MCV: 109.3 fL — ABNORMAL HIGH (ref 80.0–100.0)
Monocytes Absolute: 0.5 10*3/uL (ref 0.1–1.0)
Monocytes Relative: 8 %
Neutro Abs: 3.6 10*3/uL (ref 1.7–7.7)
Neutrophils Relative %: 59 %
Platelets: 386 10*3/uL (ref 150–400)
RBC: 3.88 MIL/uL (ref 3.87–5.11)
RDW: 12.2 % (ref 11.5–15.5)
WBC: 5.9 10*3/uL (ref 4.0–10.5)
nRBC: 0 % (ref 0.0–0.2)

## 2021-12-04 LAB — COMPREHENSIVE METABOLIC PANEL
ALT: 18 U/L (ref 0–44)
AST: 25 U/L (ref 15–41)
Albumin: 4.5 g/dL (ref 3.5–5.0)
Alkaline Phosphatase: 31 U/L — ABNORMAL LOW (ref 38–126)
Anion gap: 9 (ref 5–15)
BUN: 16 mg/dL (ref 6–20)
CO2: 23 mmol/L (ref 22–32)
Calcium: 9.3 mg/dL (ref 8.9–10.3)
Chloride: 107 mmol/L (ref 98–111)
Creatinine, Ser: 0.74 mg/dL (ref 0.44–1.00)
GFR, Estimated: >60 mL/min (ref >60)
Glucose, Bld: 106 mg/dL — ABNORMAL HIGH (ref 70–99)
Potassium: 4.3 mmol/L (ref 3.5–5.1)
Sodium: 139 mmol/L (ref 135–145)
Total Bilirubin: 0.6 mg/dL (ref 0.3–1.2)
Total Protein: 7.7 g/dL (ref 6.5–8.1)

## 2021-12-04 LAB — LIPID PANEL
Cholesterol: 164 mg/dL (ref 0–200)
HDL: 48 mg/dL (ref >40)
LDL Cholesterol: 74 mg/dL (ref 0–99)
Total CHOL/HDL Ratio: 3.4 RATIO
Triglycerides: 211 mg/dL — ABNORMAL HIGH (ref <150)
VLDL: 42 mg/dL — ABNORMAL HIGH (ref 0–40)

## 2021-12-04 LAB — TSH: TSH: 0.78 u[IU]/mL (ref 0.350–4.500)

## 2021-12-04 LAB — HEPATITIS C ANTIBODY: HCV Ab: NONREACTIVE

## 2021-12-06 ENCOUNTER — Ambulatory Visit: Payer: 59

## 2021-12-07 ENCOUNTER — Other Ambulatory Visit: Payer: Self-pay

## 2021-12-07 ENCOUNTER — Other Ambulatory Visit (HOSPITAL_COMMUNITY): Payer: Self-pay

## 2021-12-07 ENCOUNTER — Ambulatory Visit (INDEPENDENT_AMBULATORY_CARE_PROVIDER_SITE_OTHER): Payer: 59

## 2021-12-07 DIAGNOSIS — T63441D Toxic effect of venom of bees, accidental (unintentional), subsequent encounter: Secondary | ICD-10-CM | POA: Diagnosis not present

## 2021-12-07 DIAGNOSIS — J301 Allergic rhinitis due to pollen: Secondary | ICD-10-CM | POA: Diagnosis not present

## 2021-12-11 ENCOUNTER — Ambulatory Visit: Payer: 59

## 2021-12-13 LAB — ALLERGENS W/TOTAL IGE AREA 2
Alternaria Alternata IgE: <0.1 kU/L
Aspergillus Fumigatus IgE: <0.1 kU/L
Bermuda Grass IgE: <0.1 kU/L
Cat Dander IgE: <0.1 kU/L
Cedar, Mountain IgE: <0.1 kU/L
Cladosporium Herbarum IgE: <0.1 kU/L
Cockroach, German IgE: <0.1 kU/L
Common Silver Birch IgE: <0.1 kU/L
Cottonwood IgE: <0.1 kU/L
D Farinae IgE: 0.63 kU/L — AB
D Pteronyssinus IgE: 0.25 kU/L — AB
Dog Dander IgE: 0.24 kU/L — AB
Elm, American IgE: <0.1 kU/L
IgE (Immunoglobulin E), Serum: 23 IU/mL (ref 6–495)
Johnson Grass IgE: <0.1 kU/L
Maple/Box Elder IgE: <0.1 kU/L
Mouse Urine IgE: <0.1 kU/L
Oak, White IgE: <0.1 kU/L
Pecan, Hickory IgE: <0.1 kU/L
Penicillium Chrysogen IgE: <0.1 kU/L
Pigweed, Rough IgE: <0.1 kU/L
Ragweed, Short IgE: <0.1 kU/L
Sheep Sorrel IgE Qn: <0.1 kU/L
Timothy Grass IgE: <0.1 kU/L
White Mulberry IgE: <0.1 kU/L

## 2021-12-14 ENCOUNTER — Other Ambulatory Visit: Payer: 59

## 2021-12-17 ENCOUNTER — Encounter: Payer: Self-pay | Admitting: Allergy

## 2021-12-17 DIAGNOSIS — J301 Allergic rhinitis due to pollen: Secondary | ICD-10-CM

## 2021-12-18 NOTE — Telephone Encounter (Signed)
Thank you :)

## 2021-12-18 NOTE — Telephone Encounter (Signed)
I looked through her chart, and it appears she is on a beta-blocker.   We were excluding those patients from [facility] unfortunately.

## 2021-12-19 ENCOUNTER — Other Ambulatory Visit: Payer: 59

## 2021-12-19 NOTE — Addendum Note (Signed)
Addended by: Theresia Lo on: 12/19/2021 03:44 PM ? ? Modules accepted: Orders ? ?

## 2021-12-21 ENCOUNTER — Other Ambulatory Visit: Payer: Self-pay

## 2021-12-24 ENCOUNTER — Other Ambulatory Visit: Payer: Self-pay

## 2021-12-24 DIAGNOSIS — H17821 Peripheral opacity of cornea, right eye: Secondary | ICD-10-CM | POA: Diagnosis not present

## 2021-12-24 DIAGNOSIS — H16301 Unspecified interstitial keratitis, right eye: Secondary | ICD-10-CM | POA: Diagnosis not present

## 2021-12-24 DIAGNOSIS — H0102A Squamous blepharitis right eye, upper and lower eyelids: Secondary | ICD-10-CM | POA: Diagnosis not present

## 2021-12-24 DIAGNOSIS — H04123 Dry eye syndrome of bilateral lacrimal glands: Secondary | ICD-10-CM | POA: Diagnosis not present

## 2021-12-24 DIAGNOSIS — H25011 Cortical age-related cataract, right eye: Secondary | ICD-10-CM | POA: Diagnosis not present

## 2021-12-24 DIAGNOSIS — H0102B Squamous blepharitis left eye, upper and lower eyelids: Secondary | ICD-10-CM | POA: Diagnosis not present

## 2021-12-24 DIAGNOSIS — H401112 Primary open-angle glaucoma, right eye, moderate stage: Secondary | ICD-10-CM | POA: Diagnosis not present

## 2021-12-28 ENCOUNTER — Other Ambulatory Visit
Admission: RE | Admit: 2021-12-28 | Discharge: 2021-12-28 | Disposition: A | Discharge status: Home / Self Care | Payer: 59 | Source: Ambulatory Visit | Attending: Allergy | Admitting: Allergy

## 2021-12-28 DIAGNOSIS — J301 Allergic rhinitis due to pollen: Secondary | ICD-10-CM | POA: Insufficient documentation

## 2021-12-31 LAB — MISC LABCORP TEST (SEND OUT): Labcorp test code: 62448

## 2022-01-02 NOTE — Telephone Encounter (Signed)
Also-has she called her insurance to verify it will be covered?  ? ?Code [place] [address].

## 2022-01-09 NOTE — Telephone Encounter (Signed)
Sure, I can do that ?

## 2022-01-09 NOTE — Telephone Encounter (Signed)
Lvm to schedule RUSH pt ?

## 2022-01-09 NOTE — Telephone Encounter (Signed)
Thank you :)

## 2022-01-09 NOTE — Telephone Encounter (Signed)
Sorry Rachel Hooper- she is aleady on VENOM.  She will be doing environmental allergy injections.   This is my mistake.  She will only need the one appointment that she already has.

## 2022-01-09 NOTE — Telephone Encounter (Signed)
Hi Apolonio Schneiders! This is the patient interested in venom rush.  Can we get her scheduled?  ? ?Also when you call her, can you ensure she has given her insurance the code  ?95180 x 9 hours - it will be spread over 3 consecutive Fridays in case she asks. I think she knows this part, but each day will be anywhere from 3-4 hours each. ? ?Ashleigh- ?Can we send her the premedication forms and send in her Rx for prednisone and singulair.   ?Remind her to come off her beta-blocker if she hasn't already. ? ?DAY BEFORE IMMUNOTHERAPY: ?Take one tablet of Zyrtec or Claritin 10 mg or Allegra 180 mg (over the counter)  ?Pepcid 20 mg in the morning and evening (over the counter)  ?Singulair 10 mg in the morning ('5mg'$  if 6-14, '4mg'$  if <6) ?Prednisone 40 mg (2 pills) in the morning  ?? ?DAY OF IMMUNOTHERAPY: ?Take one tablet of Zyrtec or Claritin 10 mg or Allegra 180 mg (over the counter)  ?Pepcid 20 mg in the morning and evening (over the counter)  ?Singulair 10?mg in the morning  ('5mg'$  if 6-14, '4mg'$  if <6yo) ?Prednisone 40?mg (2 pills) in the morning  ?? ?Bring your?Epipen with you to the clinic! ?Please arrive no later than 8:00am for your rush appointment ?Make sure to bring food, and activities to keep you occupied for the day ? ? ?Thanks!

## 2022-01-09 NOTE — Telephone Encounter (Signed)
PT IS SCHEDULED FOR 4/21. PT STATES SHE CHECKED WITH INSURANCE BUT EVEN  IF IT IS NOT COVERED, SHE WILL PROCEED. SHE ALSO STATED SHE WAS WAITING TO HEAR BACK FROM [place] ABOUT TOTAL COST. PT ASKED IF THERE WAS A CRASH CART AVAILABLE( SHE SAID IT WAS JUST A CURIOUS QUESTION) ?

## 2022-01-10 ENCOUNTER — Telehealth: Payer: Self-pay | Admitting: Allergy

## 2022-01-10 NOTE — Telephone Encounter (Signed)
PATIENT CALLED IN REQUESTING TO SPEAK TO BETH OR ASHLEIGH FERNANDEZ. SHE WANTS EITHER BETH OR Espino TO CALL HER BACK AT [phone] ?

## 2022-01-10 NOTE — Telephone Encounter (Signed)
Hi Glendia, ?I received your message when I was in a meeting.  Please let me know when you will be available tomorrow.  I will have more time to talk after 12:00 pm.  Just let me know what might work with your schedule. ? ?Thank you! ? ?Beth Lakyn Alsteen,CMA (AAMA) ?Practice Administrator ?

## 2022-01-11 NOTE — Telephone Encounter (Signed)
Received letter from [facility] that claim was denied for lab testing ?

## 2022-01-15 ENCOUNTER — Other Ambulatory Visit (HOSPITAL_COMMUNITY): Payer: Self-pay

## 2022-01-15 DIAGNOSIS — B0052 Herpesviral keratitis: Secondary | ICD-10-CM | POA: Diagnosis not present

## 2022-01-15 DIAGNOSIS — H4061X0 Glaucoma secondary to drugs, right eye, stage unspecified: Secondary | ICD-10-CM | POA: Diagnosis not present

## 2022-01-15 DIAGNOSIS — T380X5A Adverse effect of glucocorticoids and synthetic analogues, initial encounter: Secondary | ICD-10-CM | POA: Diagnosis not present

## 2022-01-15 DIAGNOSIS — H209 Unspecified iridocyclitis: Secondary | ICD-10-CM | POA: Diagnosis not present

## 2022-01-15 MED ORDER — AZATHIOPRINE 50 MG PO TABS
50.0000 mg | ORAL_TABLET | Freq: Three times a day (TID) | ORAL | 2 refills | Status: DC
  Filled 2022-01-15 – 2022-03-25 (×2): qty 270, 90d supply, fill #0
  Filled 2022-09-24: qty 270, 90d supply, fill #1

## 2022-01-15 MED ORDER — VALACYCLOVIR HCL 1 G PO TABS
1000.0000 mg | ORAL_TABLET | Freq: Every day | ORAL | 0 refills | Status: DC
  Filled 2022-01-15: qty 60, 60d supply, fill #0

## 2022-01-15 MED ORDER — LOTEMAX SM 0.38 % OP GEL
1.0000 [drp] | Freq: Two times a day (BID) | OPHTHALMIC | 11 refills | Status: DC
  Filled 2022-01-15: qty 5, 50d supply, fill #0
  Filled 2022-04-30: qty 5, 37d supply, fill #0
  Filled 2022-08-02: qty 5, 37d supply, fill #1
  Filled 2022-09-24: qty 10, 74d supply, fill #2

## 2022-01-15 MED ORDER — TIMOLOL MALEATE 0.5 % OP SOLN
1.0000 [drp] | Freq: Every day | OPHTHALMIC | 11 refills | Status: AC
  Filled 2022-01-15: qty 5, 100d supply, fill #0
  Filled 2022-08-02: qty 5, 30d supply, fill #0
  Filled 2022-09-24: qty 5, 90d supply, fill #1

## 2022-01-23 NOTE — Progress Notes (Signed)
Aeroallergen Immunotherapy  ? ?Ordering Provider: Dr. Prudy Feeler  ? ?Patient Details  ?Name: Rachel Hooper  ?MRN: [id number]  ?Date of Birth: 11/19/1980  ? ?Order 1 of 1  ? ?Vial Label: Dust mite, dog  ? ?0.5 ml (Volume)  1:10 Concentration -- Dog Epithelia  ?0.5 ml (Volume)   AU Concentration -- Mite Mix (DF 5,000 & DP 5,000)  ? ? ?1.0  ml Extract Subtotal  ?4.0  ml Diluent  ?5.0  ml Maintenance Total  ? ?Schedule:  B  ?Silver Vial (1:1,000,000): RUSH  ?Blue Vial (1:100,000): RUSH  ?Yellow Vial (1:10,000): RUSH  ?Green Vial (1:1,000): Schedule B (6 doses)  ?Red Vial (1:100): Schedule A (10 doses)  ? ?Special Instructions: RUSH protocol scheduled for 02/15/22.   Home base for weekly injections [place]. ?

## 2022-01-23 NOTE — Progress Notes (Addendum)
VIALS EXP 01-31-23 ?

## 2022-01-30 DIAGNOSIS — J3081 Allergic rhinitis due to animal (cat) (dog) hair and dander: Secondary | ICD-10-CM | POA: Diagnosis not present

## 2022-02-05 ENCOUNTER — Other Ambulatory Visit: Payer: Self-pay | Admitting: *Deleted

## 2022-02-05 ENCOUNTER — Telehealth: Payer: Self-pay | Admitting: Internal Medicine

## 2022-02-05 ENCOUNTER — Other Ambulatory Visit: Payer: Self-pay

## 2022-02-05 MED ORDER — PREDNISONE 20 MG PO TABS
ORAL_TABLET | ORAL | 0 refills | Status: DC
  Filled 2022-02-05: qty 4, 1d supply, fill #0

## 2022-02-05 NOTE — Telephone Encounter (Signed)
Patient is requesting the prednisone that is needed for her RUSH immunotherapy appointment to be sent in to her pharmacy, [facility].  ?

## 2022-02-05 NOTE — Telephone Encounter (Signed)
Prednisone has been sent in to patients pharmacy. Called and left a detailed voicemail advising patient.  ?

## 2022-02-05 NOTE — Telephone Encounter (Signed)
Hey Dr. Simona Huh,  ?Since I am still new to the [facility] protocol, do you mind advising how much to send in? I will gladly send it in to her.  ?

## 2022-02-05 NOTE — Telephone Encounter (Signed)
Yes absolutely.  It will 40 mg the day before and day of.  Thanks! ? ?Here is the premeds below.   ? ?DAY BEFORE IMMUNOTHERAPY: ?Take one tablet of Zyrtec or Claritin 10 mg or Allegra 180 mg (over the counter)  ?Pepcid 20 mg in the morning and evening (over the counter)  ?Singulair 10 mg in the morning  ?Prednisone 40 mg (2 pills) in the morning  ?? ?DAY OF IMMUNOTHERAPY: ?Take one tablet of Zyrtec or Claritin 10 mg or Allegra 180 mg (over the counter)  ?Pepcid 20 mg in the morning and evening (over the counter)  ?Singulair 10?mg in the morning   ?Prednisone 40?mg (2 pills) in the morning  ?? ?Bring your?Epipen with you to the clinic! ?Please arrive no later than 8:00am for your rush appointment ?Make sure to bring food, and activities to keep you occupied for the day ?

## 2022-02-05 NOTE — Telephone Encounter (Signed)
Okay can you please send-

## 2022-02-08 ENCOUNTER — Ambulatory Visit: Payer: 59 | Admitting: Internal Medicine

## 2022-02-10 MED FILL — Montelukast Sodium Tab 10 MG (Base Equiv): ORAL | 90 days supply | Qty: 90 | Fill #1 | Status: AC

## 2022-02-11 ENCOUNTER — Telehealth: Payer: Self-pay

## 2022-02-11 ENCOUNTER — Other Ambulatory Visit: Payer: Self-pay

## 2022-02-11 NOTE — Telephone Encounter (Signed)
Called pt today to follow up with her on her upon coming [place] Friday, LVM for pt to call or send a Mychart msg to confirm she received all her Pre-meds that she needs to start Thursday morning, PM, and Friday Morning. ?

## 2022-02-12 ENCOUNTER — Encounter: Payer: Self-pay | Admitting: Internal Medicine

## 2022-02-14 NOTE — Progress Notes (Signed)
? ?RAPID DESENSITIZATION Note ? ?RE: Rachel Hooper MRN: [id number] DOB: [birth year] ?Date of Office Visit: 02/15/2022 ? ?Subjective:  ?Patient presents today for rapid desensitization. ? ?Interval History: ?Patient has not been ill, she has taken all premedications as per protocol. ? ?Recent/Current History: ?Pulmonary disease: no ?Cardiac disease: no ?Respiratory infection: no ?Rash: no ?Itch: no ?Swelling: no ?Cough: no ?Shortness of breath: no ?Runny/stuffy nose: no ?Itchy eyes: no ?Beta-blocker use: no ? ?Patient/guardian was informed of the procedure with verbalized understanding of the risk of anaphylaxis. Consent has been signed.  ? ?Medication List:  ?Current Outpatient Medications  ?Medication Sig Dispense Refill  ? albuterol (VENTOLIN HFA) 108 (90 Base) MCG/ACT inhaler INHALE 2 PUFFS BY MOUTH EVERY 6 HOURS AS NEEDED FOR WHEEZING OR SHORTNESS OF BREATH. 18 g 1  ? aspirin 81 MG tablet Take 81 mg by mouth daily.    ? azaTHIOprine (IMURAN) 50 MG tablet Take 1 tablet (50 mg total) by mouth 3 (three) times daily. 270 tablet 2  ? azaTHIOprine (IMURAN) 50 MG tablet Take 1 tablet (50 mg total) by mouth 3 (three) times daily. 270 tablet 2  ? benzonatate (TESSALON PERLES) 100 MG capsule Take 1 capsule (100 mg total) by mouth 3 (three) times daily as needed. 20 capsule 0  ? budesonide (PULMICORT FLEXHALER) 180 MCG/ACT inhaler INHALE TWO PUFFS BY MOUTH INTO THE LUNGS TWICE A DAY TO PREVENT COUGH OR WHEEZE. RINSE, GARGLE AND SPIT AFTER USE. 3 each 3  ? budesonide (RHINOCORT ALLERGY) 32 MCG/ACT nasal spray ONE SPRAY EACH NOSTRIL TWICE A DAY FOR NASAL CONGESTION OR DRAINAGE. (Patient taking differently: Place 1 spray into both nostrils 2 (two) times daily.) 8.6 g 5  ? cetirizine (ZYRTEC) 10 MG tablet Take 1 tablet (10 mg total) by mouth at bedtime. 30 tablet 5  ? EPINEPHrine 0.3 mg/0.3 mL IJ SOAJ injection INJECT 0.3 MLS (0.3 MG TOTAL) INTO THE MUSCLE ONCE FOR 1 DOSE. USE AS DIRECTED FOR SEVERE ALLERGIC REACTION. 4 each  0  ? Evolocumab (REPATHA SURECLICK) 174 MG/ML SOAJ Inject 1 Dose into the skin every 14 (fourteen) days. 2 mL 11  ? Loteprednol Etabonate (LOTEMAX SM) 0.38 % GEL Place 1 drop into the right eye in the morning and at bedtime. (Patient taking differently: Place 1 drop into the right eye daily.) 5 g 11  ? Loteprednol Etabonate (LOTEMAX SM) 0.38 % GEL Place 1 drop into the right eye in the morning and at bedtime. 5 g 11  ? Loteprednol Etabonate (LOTEMAX SM) 0.38 % GEL Place 1 drop into the right eye in the morning and at bedtime. 5 g 11  ? magnesium oxide-pyridoxine (BEELITH) 362-20 MG TABS Take 1 tablet by mouth every evening.     ? metoprolol succinate (TOPROL-XL) 25 MG 24 hr tablet 1 tablet Orally Once a day 30 day(s) 90 tablet 1  ? montelukast (SINGULAIR) 10 MG tablet TAKE 1 TABLET (10 MG TOTAL) BY MOUTH AT BEDTIME. 90 tablet 1  ? Naltrexone-buPROPion HCl ER (CONTRAVE) 8-90 MG TB12 Take 1 tablet by mouth in the morning for a week, then one tablet by mouth twice daily for a week, then two tablets by mouth in the morning and one tablet in the evening for a week, then two tablets by mouth in the morning and two tablets by mouth in the evening thereafter. 360 tablet 0  ? Naltrexone-buPROPion HCl ER (CONTRAVE) 8-90 MG TB12 Take 2 tablets twice a day by oral route for 90 days. 360 tablet 0  ?  Norethindrone Acetate-Ethinyl Estradiol (LARIN 1.5/30) 1.5-30 MG-MCG tablet TAKE 1 TABLET BY MOUTH DAILY (CONTINUOUS ACTIVE PILLS) 90 tablet 3  ? potassium citrate (UROCIT-K) 10 MEQ (1080 MG) SR tablet Take 2 tablets (20 mEq total) by mouth 2 times daily. Take two tablets by mouth in am and pm 120 tablet 2  ? predniSONE (DELTASONE) 20 MG tablet Take '40mg'$  the day before and '40mg'$  the day off for RUSH protocol. 4 tablet 0  ? senna-docusate (SENOKOT-S) 8.6-50 MG tablet Take 2 tablets by mouth at bedtime. For AFTER surgery, do not take if having diarrhea 30 tablet 0  ? timolol (TIMOPTIC) 0.5 % ophthalmic solution Place 1 drop into the  right eye daily. 5 mL 11  ? timolol (TIMOPTIC) 0.5 % ophthalmic solution Place 1 drop into the right eye daily. 5 mL 11  ? timolol (TIMOPTIC) 0.5 % ophthalmic solution Place 1 drop into the right eye daily. 5 mL 11  ? valACYclovir (VALTREX) 1000 MG tablet Take 1 tablet (1,000 mg total) by mouth daily. 90 tablet 11  ? valACYclovir (VALTREX) 1000 MG tablet Take 1 tablet (1,000 mg total) by mouth daily. 60 tablet 0  ? valACYclovir (VALTREX) 1000 MG tablet Take 1 tablet (1,000 mg total) by mouth daily. 60 tablet 0  ? valACYclovir (VALTREX) 1000 MG tablet Take 1 tablet by mouth daily.    ? ?No current facility-administered medications for this visit.  ? ?Allergies: ?Allergies  ?Allergen Reactions  ? Bee Venom Anaphylaxis  ?  And wasp venom  ? Pneumococcal Vaccines Anaphylaxis  ? Atorvastatin   ?  Fatigue, Brain Fog, Muscle Pain  ? Mixed Vespid Venom   ? Rosuvastatin   ?  Fatigue, Brain Fog, Muscle Pain  ? Wasp Venom   ? ?I reviewed her past medical history, social history, family history, and environmental history and no significant changes have been reported from her previous visit. ? ?ROS: Negative except as per HPI. ? ?Objective: ?BP 140/90   Pulse (!) 106   Temp 97.7 ?F (36.5 ?C) (Temporal)   Resp 19   Ht 5' 4.25" (1.632 m)   Wt 169 lb 8 oz (76.9 kg)   SpO2 99%   BMI 28.87 kg/m?  ?Body mass index is 28.87 kg/m?. ? ? ?General Appearance:  Alert, cooperative, no distress, appears stated age  ?Head:  Normocephalic, without obvious abnormality, atraumatic  ?Eyes:  Conjunctiva clear, EOM's intact  ?Nose: Nares normal  ?Throat: Lips, tongue normal; teeth and gums normal, normal posterior oropharynx  ?Neck: Supple, symmetrical  ?Lungs:   Clear to auscultation bilaterally, respirations unlabored, no coughing  ?Heart:  Regular rate and rhythm, no murmur, appears well perfused  ?Extremities: No edema  ?Skin: Skin color, texture, turgor normal, no rashes or lesions on visualized portions of skin  ?Neurologic: No gross  deficits  ? ? ? ?Diagnostics: ? ?PROCEDURES: ? ?Patient received the following doses every hour: ?Step 1:  0.30m - 1:1,000,000 dilution (silver vial) ?Step 2:  0.355m- 1:1,000,000 dilution (silver vial) ?Step 3: 0.52m46m 1:100,000 dilution (blue vial)  ?Step 4: 0.3ml55m1:100,000 dilution (blue vial)  ?Step 5: 0.52ml 3m:10,000 dilution (gold vial) ?Step 6: 0.2ml -32m10,000 dilution (gold vial) ?Step 7: 0.3ml - 62m0,000 dilution (gold vial) ?Step 8: 0.4ml - 188m,000 dilution (gold vial) ? ?Patient was observed for 1 hour after the last dose.  ? ?Protocol initiated at 8:39 am and patient discharged at 3:40 pm.  ? ?ASSESSMENT/PLAN:  ? ?Patient has tolerated the rapid  desensitization protocol. ? ?Next appointment: Start at 0.1m of 1:1000 dilution (green vial) and build up per protocol. ? ?

## 2022-02-15 ENCOUNTER — Ambulatory Visit (INDEPENDENT_AMBULATORY_CARE_PROVIDER_SITE_OTHER): Payer: 59 | Admitting: Internal Medicine

## 2022-02-15 ENCOUNTER — Encounter: Payer: Self-pay | Admitting: Internal Medicine

## 2022-02-15 VITALS — BP 142/94 | HR 100 | Temp 98.4°F | Resp 18 | Ht 64.25 in | Wt 169.5 lb

## 2022-02-15 DIAGNOSIS — J309 Allergic rhinitis, unspecified: Secondary | ICD-10-CM

## 2022-02-18 ENCOUNTER — Ambulatory Visit (INDEPENDENT_AMBULATORY_CARE_PROVIDER_SITE_OTHER): Payer: 59

## 2022-02-18 DIAGNOSIS — J309 Allergic rhinitis, unspecified: Secondary | ICD-10-CM | POA: Diagnosis not present

## 2022-02-20 ENCOUNTER — Telehealth: Payer: Self-pay | Admitting: Internal Medicine

## 2022-02-20 NOTE — Telephone Encounter (Signed)
Called and spoke with patient.  Informed patient of confirmed plan per Dr. Nelva Bush regarding patient going forward with her  ?Allergy Injections and Venom Injections. ?Patient is okay to receive allergy injections twice a week for build up only. ?Patient will come once a week when she is in Red(maintenance) vial.  ?Patient only needs to complete one Red vial without any issues and then she can advance with her second Red vial to  weekly, then every 2 weeks at .50 cc. ?Patient needs to get her Venom injections on different days while she is doing buildup vials only.  She can change this once she is in her Red second vial since she has been on venom for so long without any issues. ?

## 2022-02-20 NOTE — Telephone Encounter (Signed)
Patient called and was waiting on a call from [facility] about her coming in tomorrow for a rush injection. She would like for you to call her and let her know if she can come in. [phone] ?

## 2022-02-20 NOTE — Progress Notes (Addendum)
Called and spoke with patient.  Informed patient of confirmed plan per Dr. Nelva Bush regarding patient going forward with her  ?Allergy Injections and Venom Injections. ?Patient is okay to receive allergy injections twice a week for build up only. ?Patient will come once a week when she is in Red(maintenance) vial.  ?Patient only needs to complete one Red vial without any issues and then she can advance with her second Red vial to  weekly, then every 2 weeks at .50 cc. ?Per Dr. Nelva Bush, patient can be on Schedule B with first Red Vial and then Schedule C with second Red Vial if no issues. ?Patient needs to get her Venom injections on different days while she is doing buildup vials only.  She can change this once she is in her Red second vial since she has been on venom for so long without any issues ?

## 2022-02-20 NOTE — Telephone Encounter (Signed)
Rachel Hooper called this afternoon and stated that she wants you to know that she is coming tomorrow for her 2nd shot this week and then next week one time for venom shot. Per Dr Simona Huh. She requested that you call her back at [phone] ?

## 2022-02-21 ENCOUNTER — Ambulatory Visit (INDEPENDENT_AMBULATORY_CARE_PROVIDER_SITE_OTHER): Payer: 59

## 2022-02-21 DIAGNOSIS — J309 Allergic rhinitis, unspecified: Secondary | ICD-10-CM

## 2022-02-25 ENCOUNTER — Other Ambulatory Visit: Payer: Self-pay

## 2022-02-25 ENCOUNTER — Ambulatory Visit (INDEPENDENT_AMBULATORY_CARE_PROVIDER_SITE_OTHER): Payer: 59

## 2022-02-25 ENCOUNTER — Other Ambulatory Visit (HOSPITAL_COMMUNITY): Payer: Self-pay

## 2022-02-25 DIAGNOSIS — J309 Allergic rhinitis, unspecified: Secondary | ICD-10-CM | POA: Diagnosis not present

## 2022-02-26 ENCOUNTER — Telehealth: Payer: Self-pay | Admitting: Internal Medicine

## 2022-02-26 NOTE — Telephone Encounter (Signed)
Received PA request for Repatha in [facility] portal ?Attempted to submit and message received:  ?Member has an active PA on file which is expiring on 03/06/2022 and has 4 no. of fills remaining ?Unable to complete at this time ?

## 2022-02-27 ENCOUNTER — Ambulatory Visit: Payer: 59

## 2022-02-27 ENCOUNTER — Ambulatory Visit (INDEPENDENT_AMBULATORY_CARE_PROVIDER_SITE_OTHER): Payer: 59

## 2022-02-27 DIAGNOSIS — R82992 Hyperoxaluria: Secondary | ICD-10-CM | POA: Diagnosis not present

## 2022-02-27 DIAGNOSIS — T63441D Toxic effect of venom of bees, accidental (unintentional), subsequent encounter: Secondary | ICD-10-CM

## 2022-02-27 DIAGNOSIS — J309 Allergic rhinitis, unspecified: Secondary | ICD-10-CM

## 2022-02-27 DIAGNOSIS — N200 Calculus of kidney: Secondary | ICD-10-CM | POA: Diagnosis not present

## 2022-02-28 ENCOUNTER — Ambulatory Visit: Payer: 59

## 2022-03-01 ENCOUNTER — Other Ambulatory Visit: Payer: Self-pay

## 2022-03-01 ENCOUNTER — Other Ambulatory Visit (HOSPITAL_COMMUNITY): Payer: Self-pay

## 2022-03-07 ENCOUNTER — Ambulatory Visit (INDEPENDENT_AMBULATORY_CARE_PROVIDER_SITE_OTHER): Payer: 59

## 2022-03-07 DIAGNOSIS — J309 Allergic rhinitis, unspecified: Secondary | ICD-10-CM | POA: Diagnosis not present

## 2022-03-12 ENCOUNTER — Ambulatory Visit (INDEPENDENT_AMBULATORY_CARE_PROVIDER_SITE_OTHER): Payer: 59

## 2022-03-12 DIAGNOSIS — J309 Allergic rhinitis, unspecified: Secondary | ICD-10-CM

## 2022-03-14 ENCOUNTER — Other Ambulatory Visit: Payer: Self-pay

## 2022-03-14 ENCOUNTER — Ambulatory Visit (INDEPENDENT_AMBULATORY_CARE_PROVIDER_SITE_OTHER): Payer: 59

## 2022-03-14 DIAGNOSIS — R82992 Hyperoxaluria: Secondary | ICD-10-CM | POA: Diagnosis not present

## 2022-03-14 DIAGNOSIS — N3010 Interstitial cystitis (chronic) without hematuria: Secondary | ICD-10-CM | POA: Diagnosis not present

## 2022-03-14 DIAGNOSIS — J309 Allergic rhinitis, unspecified: Secondary | ICD-10-CM

## 2022-03-14 DIAGNOSIS — N200 Calculus of kidney: Secondary | ICD-10-CM | POA: Diagnosis not present

## 2022-03-14 MED ORDER — POTASSIUM CITRATE ER 10 MEQ (1080 MG) PO TBCR
EXTENDED_RELEASE_TABLET | ORAL | 3 refills | Status: DC
  Filled 2022-03-14: qty 120, 30d supply, fill #0
  Filled 2022-03-20: qty 240, 60d supply, fill #0
  Filled 2022-03-20: qty 120, 30d supply, fill #0
  Filled 2022-11-12: qty 120, 30d supply, fill #1

## 2022-03-15 ENCOUNTER — Other Ambulatory Visit: Payer: Self-pay

## 2022-03-19 ENCOUNTER — Ambulatory Visit (INDEPENDENT_AMBULATORY_CARE_PROVIDER_SITE_OTHER): Payer: 59

## 2022-03-19 DIAGNOSIS — J309 Allergic rhinitis, unspecified: Secondary | ICD-10-CM

## 2022-03-20 ENCOUNTER — Other Ambulatory Visit: Payer: Self-pay

## 2022-03-21 ENCOUNTER — Other Ambulatory Visit: Payer: Self-pay

## 2022-03-21 ENCOUNTER — Ambulatory Visit (INDEPENDENT_AMBULATORY_CARE_PROVIDER_SITE_OTHER): Payer: 59

## 2022-03-21 DIAGNOSIS — J309 Allergic rhinitis, unspecified: Secondary | ICD-10-CM | POA: Diagnosis not present

## 2022-03-22 ENCOUNTER — Other Ambulatory Visit: Payer: Self-pay

## 2022-03-25 ENCOUNTER — Ambulatory Visit (INDEPENDENT_AMBULATORY_CARE_PROVIDER_SITE_OTHER): Payer: 59

## 2022-03-25 ENCOUNTER — Telehealth: Payer: Self-pay | Admitting: Internal Medicine

## 2022-03-25 ENCOUNTER — Encounter: Payer: Self-pay | Admitting: Internal Medicine

## 2022-03-25 ENCOUNTER — Other Ambulatory Visit: Payer: Self-pay

## 2022-03-25 ENCOUNTER — Other Ambulatory Visit: Payer: Self-pay | Admitting: Internal Medicine

## 2022-03-25 DIAGNOSIS — J309 Allergic rhinitis, unspecified: Secondary | ICD-10-CM

## 2022-03-25 NOTE — Telephone Encounter (Signed)
*  STAT* If patient is at the pharmacy, call can be transferred to refill team.   1. Which medications need to be refilled? (please list name of each medication and dose if known) Evolocumab (REPATHA SURECLICK) 950 MG/ML SOAJ  2. Which pharmacy/location (including street and city if local pharmacy) is medication to be sent to? [facility]  3. Do they need a 30 day or 90 day supply? [place]

## 2022-03-26 ENCOUNTER — Other Ambulatory Visit: Payer: Self-pay

## 2022-03-26 MED ORDER — REPATHA SURECLICK 140 MG/ML ~~LOC~~ SOAJ
1.0000 | SUBCUTANEOUS | 3 refills | Status: DC
  Filled 2022-03-26: qty 6, 84d supply, fill #0
  Filled 2022-06-07: qty 6, 84d supply, fill #1
  Filled 2022-09-24: qty 6, 84d supply, fill #2
  Filled 2023-01-06 – 2023-01-14 (×4): qty 6, 84d supply, fill #3

## 2022-03-26 MED FILL — Evolocumab Subcutaneous Soln Auto-Injector 140 MG/ML: SUBCUTANEOUS | 28 days supply | Qty: 2 | Fill #0 | Status: CN

## 2022-03-26 NOTE — Telephone Encounter (Signed)
90 days repatha sent with 3 refills

## 2022-03-27 ENCOUNTER — Other Ambulatory Visit: Payer: Self-pay

## 2022-04-01 ENCOUNTER — Ambulatory Visit (INDEPENDENT_AMBULATORY_CARE_PROVIDER_SITE_OTHER): Payer: 59

## 2022-04-01 DIAGNOSIS — J309 Allergic rhinitis, unspecified: Secondary | ICD-10-CM | POA: Diagnosis not present

## 2022-04-08 ENCOUNTER — Ambulatory Visit (INDEPENDENT_AMBULATORY_CARE_PROVIDER_SITE_OTHER): Payer: 59

## 2022-04-08 DIAGNOSIS — J309 Allergic rhinitis, unspecified: Secondary | ICD-10-CM | POA: Diagnosis not present

## 2022-04-09 DIAGNOSIS — Z760 Encounter for issue of repeat prescription: Secondary | ICD-10-CM | POA: Diagnosis not present

## 2022-04-15 ENCOUNTER — Ambulatory Visit (INDEPENDENT_AMBULATORY_CARE_PROVIDER_SITE_OTHER): Payer: 59

## 2022-04-15 DIAGNOSIS — J309 Allergic rhinitis, unspecified: Secondary | ICD-10-CM | POA: Diagnosis not present

## 2022-04-24 ENCOUNTER — Ambulatory Visit (INDEPENDENT_AMBULATORY_CARE_PROVIDER_SITE_OTHER): Payer: 59

## 2022-04-24 DIAGNOSIS — J309 Allergic rhinitis, unspecified: Secondary | ICD-10-CM

## 2022-04-29 ENCOUNTER — Other Ambulatory Visit: Payer: Self-pay | Admitting: *Deleted

## 2022-04-29 DIAGNOSIS — Z789 Other specified health status: Secondary | ICD-10-CM

## 2022-04-29 DIAGNOSIS — E785 Hyperlipidemia, unspecified: Secondary | ICD-10-CM

## 2022-04-30 ENCOUNTER — Other Ambulatory Visit: Payer: Self-pay

## 2022-05-02 ENCOUNTER — Ambulatory Visit (INDEPENDENT_AMBULATORY_CARE_PROVIDER_SITE_OTHER): Payer: 59

## 2022-05-02 DIAGNOSIS — J309 Allergic rhinitis, unspecified: Secondary | ICD-10-CM | POA: Diagnosis not present

## 2022-05-07 ENCOUNTER — Other Ambulatory Visit: Payer: Self-pay

## 2022-05-07 ENCOUNTER — Ambulatory Visit (INDEPENDENT_AMBULATORY_CARE_PROVIDER_SITE_OTHER): Payer: 59

## 2022-05-07 ENCOUNTER — Other Ambulatory Visit: Payer: Self-pay | Admitting: Allergy

## 2022-05-07 DIAGNOSIS — J309 Allergic rhinitis, unspecified: Secondary | ICD-10-CM | POA: Diagnosis not present

## 2022-05-07 MED FILL — Montelukast Sodium Tab 10 MG (Base Equiv): ORAL | 90 days supply | Qty: 90 | Fill #0 | Status: CN

## 2022-05-08 ENCOUNTER — Other Ambulatory Visit: Payer: Self-pay

## 2022-05-08 MED FILL — Montelukast Sodium Tab 10 MG (Base Equiv): ORAL | 90 days supply | Qty: 90 | Fill #0 | Status: AC

## 2022-05-13 ENCOUNTER — Other Ambulatory Visit: Payer: Self-pay

## 2022-05-14 ENCOUNTER — Other Ambulatory Visit: Payer: Self-pay

## 2022-05-16 ENCOUNTER — Ambulatory Visit (INDEPENDENT_AMBULATORY_CARE_PROVIDER_SITE_OTHER): Payer: 59

## 2022-05-16 ENCOUNTER — Other Ambulatory Visit: Payer: Self-pay

## 2022-05-16 DIAGNOSIS — J309 Allergic rhinitis, unspecified: Secondary | ICD-10-CM

## 2022-05-20 ENCOUNTER — Ambulatory Visit (INDEPENDENT_AMBULATORY_CARE_PROVIDER_SITE_OTHER): Payer: 59

## 2022-05-20 DIAGNOSIS — T63441D Toxic effect of venom of bees, accidental (unintentional), subsequent encounter: Secondary | ICD-10-CM

## 2022-05-22 ENCOUNTER — Ambulatory Visit (INDEPENDENT_AMBULATORY_CARE_PROVIDER_SITE_OTHER): Payer: 59

## 2022-05-22 DIAGNOSIS — J309 Allergic rhinitis, unspecified: Secondary | ICD-10-CM | POA: Diagnosis not present

## 2022-05-23 ENCOUNTER — Ambulatory Visit: Payer: 59

## 2022-05-23 ENCOUNTER — Telehealth: Payer: Self-pay | Admitting: Internal Medicine

## 2022-05-23 NOTE — Telephone Encounter (Signed)
Spoke with patient. She was scheduled for first available appt with Hilty MD for lipid f/u in order to get med refills. She did not prefer to come to [place] office for this. Changed appt to video visit.

## 2022-05-23 NOTE — Telephone Encounter (Signed)
Pt would like to know if her upcoming appt with Dr. Debara Pickett 05/27/22 at 3:15 can be a virtual visit, because she does not want to come to in the hospital.

## 2022-05-27 ENCOUNTER — Telehealth (INDEPENDENT_AMBULATORY_CARE_PROVIDER_SITE_OTHER): Payer: 59 | Admitting: Internal Medicine

## 2022-05-27 ENCOUNTER — Encounter: Payer: Self-pay | Admitting: Internal Medicine

## 2022-05-27 VITALS — BP 118/76 | HR 79 | Ht 64.0 in | Wt 170.0 lb

## 2022-05-27 DIAGNOSIS — Z789 Other specified health status: Secondary | ICD-10-CM | POA: Diagnosis not present

## 2022-05-27 DIAGNOSIS — E785 Hyperlipidemia, unspecified: Secondary | ICD-10-CM | POA: Diagnosis not present

## 2022-05-27 NOTE — Patient Instructions (Addendum)
Medication Instructions:  Your physician recommends that you continue on your current medications as directed. Please refer to the Current Medication list given to you today.   Labwork: FASTING cholesterol test (lipid panel) in November 2023 at any [facility]  Testing/Procedures: None  Follow-Up: Follow up with Dr. Debara Pickett in 1 year.   Any Other Special Instructions Will Be Listed Below (If Applicable).     If you need a refill on your cardiac medications before your next appointment, please call your pharmacy.

## 2022-05-27 NOTE — Progress Notes (Signed)
Virtual Visit via Video Note   This visit type was conducted due to national recommendations for restrictions regarding the COVID-19 Pandemic (e.g. social distancing) in an effort to limit this patient's exposure and mitigate transmission in our community.  Due to her co-morbid illnesses, this patient is at least at moderate risk for complications without adequate follow up.  This format is felt to be most appropriate for this patient at this time.  All issues noted in this document were discussed and addressed.  A limited physical exam was performed with this format.  Please refer to the patient's chart for her consent to telehealth for [facility].      Date:  05/27/2022   ID:  Rachel Hooper, DOB 07-29-1981, MRN [id number] The patient was identified using 2 identifiers.  Evaluation Performed:  Follow-Up Visit  Patient Location:  [place] [address]  Provider location:   [address], [place], [place] [address]  PCP:  Carollee Herter, Alferd Apa, DO  Cardiologist:  None Electrophysiologist:  None   Chief Complaint:  Follow-up dyslipidemia  History of Present Illness:    Rachel Hooper is a 41 y.o. female who presents via audio/video conferencing for a telehealth visit today.  Rachel Hooper returns today for follow-up.  She has responded quite nicely to [place] with marked reduction in her lipids.  All of her lipid NMR parameters are within normal limits including an LDL-P of 960, LDL-C of 63, HDL 56 and triglycerides 135.  All LDL P is improved to 436 nmol/L.  The only value that did not significantly change was her LP(a) which remains elevated at 243 nmol/L.  Overall she continues to tolerate the medicine well without any significant side effects.  05/27/2022  [name] returns today for follow-up.  She continues to do well on Repatha.  Denies any issues with side effects or cognitive dysfunction.  Repeat lipid profile was performed in February.  This  does show interval increase in her cholesterol with LDL now 74 and triglycerides 211, total cholesterol 164 and HDL 48.  She feels that this may be related to dietary factors and weight gain.  She has subsequently been trying to lose the weight which is expected to improve her lipids.  The patient does not have symptoms concerning for COVID-19 infection (fever, chills, cough, or new SHORTNESS OF BREATH).    Prior CV studies:   The following studies were reviewed today:  Chart reviewed, lab work  PMHx:  Past Medical History:  Diagnosis Date   Asthma    Endometriosis    GERD (gastroesophageal reflux disease)    History of kidney stones    Hydrosalpinx    left   Hyperlipidemia    Repatha   Hypertension    PAI-1 4G/4G genotype    PCOS (polycystic ovarian syndrome)    PONV (postoperative nausea and vomiting)    severe   Seasonal allergies    Shingles     Past Surgical History:  Procedure Laterality Date   ANTERIOR CRUCIATE LIGAMENT REPAIR Right 1995   CHROMOPERTUBATION N/A 05/03/2015   Procedure: CHROMOPERTUBATION;  Surgeon: Governor Specking, MD;  Location: [place];  Service: Gynecology;  Laterality: N/A;   EXTRACORPOREAL SHOCK WAVE LITHOTRIPSY  2008   LAPAROSCOPY N/A 03/28/2015   Procedure: LAPAROSCOPY DIAGNOSTIC;  Surgeon: Brien Few, MD;  Location: [facility];  Service: Gynecology;  Laterality: N/A;   LAPAROSCOPY N/A 05/03/2015   Procedure: LAPAROSCOPY, EXCISION ENDOMETRIOSIS, ENTERO LYSIS, WITH ENDOMETRIAL  BIOPSY;  Surgeon: Governor Specking, MD;  Location: [facility];  Service: Gynecology;  Laterality: N/A;   NASAL SEPTOPLASTY W/ TURBINOPLASTY Bilateral 06/20/2017   Procedure: NASAL SEPTOPLASTY WITH BILATERAL TURBINATE REDUCTION;  Surgeon: Melida Quitter, MD;  Location: [place];  Service: ENT;  Laterality: Bilateral;   ROBOTIC ASSISTED TOTAL HYSTERECTOMY N/A 08/16/2021   Procedure: XI ROBOTIC ASSISTED TOTAL HYSTERECTOMY; RIGHT  SALPINGO-OOPHERECTOMY;  Surgeon: [name], MD;  Location: [facility];  Service: Gynecology;  Laterality: N/A;   UNILATERAL SALPINGECTOMY Left 05/03/2015   Procedure: LEFT SALPINGECTOMY;  Surgeon: Governor Specking, MD;  Location: [place];  Service: Gynecology;  Laterality: Left;   WISDOM TOOTH EXTRACTION      FAMHx:  Family History  Problem Relation Age of Onset   Allergic rhinitis Mother    Asthma Mother    Eczema Mother    Stroke Father    COPD Maternal Grandmother    Colon cancer Maternal Grandmother    Eczema Maternal Grandfather    Dementia Maternal Grandfather    COPD Maternal Grandfather    Stroke Paternal Grandmother    Breast cancer Paternal Grandmother    Angioedema Neg Hx    Immunodeficiency Neg Hx    Urticaria Neg Hx     SOCHx:   reports that she has never smoked. She has never used smokeless tobacco. She reports that she does not drink alcohol and does not use drugs.  ALLERGIES:  Allergies  Allergen Reactions   Bee Venom Anaphylaxis    And wasp venom   Pneumococcal Vaccines Anaphylaxis   Atorvastatin     Fatigue, Brain Fog, Muscle Pain   Mixed Vespid Venom    Rosuvastatin     Fatigue, Brain Fog, Muscle Pain   Wasp Venom     MEDS:  Current Meds  Medication Sig   albuterol (VENTOLIN HFA) 108 (90 Base) MCG/ACT inhaler INHALE 2 PUFFS BY MOUTH EVERY 6 HOURS AS NEEDED FOR WHEEZING OR SHORTNESS OF BREATH.   aspirin 81 MG tablet Take 81 mg by mouth daily.   azaTHIOprine (IMURAN) 50 MG tablet Take 1 tablet (50 mg total) by mouth 3 (three) times daily.   azaTHIOprine (IMURAN) 50 MG tablet Take 1 tablet (50 mg total) by mouth 3 (three) times daily.   benzonatate (TESSALON PERLES) 100 MG capsule Take 1 capsule (100 mg total) by mouth 3 (three) times daily as needed.   budesonide (PULMICORT FLEXHALER) 180 MCG/ACT inhaler INHALE TWO PUFFS BY MOUTH INTO THE LUNGS TWICE A DAY TO PREVENT COUGH OR WHEEZE. RINSE, GARGLE AND SPIT AFTER USE.    budesonide (RHINOCORT ALLERGY) 32 MCG/ACT nasal spray ONE SPRAY EACH NOSTRIL TWICE A DAY FOR NASAL CONGESTION OR DRAINAGE. (Patient taking differently: Place 1 spray into both nostrils 2 (two) times daily.)   cetirizine (ZYRTEC) 10 MG tablet Take 1 tablet (10 mg total) by mouth at bedtime.   EPINEPHrine 0.3 mg/0.3 mL IJ SOAJ injection INJECT 0.3 MLS (0.3 MG TOTAL) INTO THE MUSCLE ONCE FOR 1 DOSE. USE AS DIRECTED FOR SEVERE ALLERGIC REACTION.   Evolocumab (REPATHA SURECLICK) 696 MG/ML SOAJ Inject 1 Dose into the skin every 14 (fourteen) days.   Loteprednol Etabonate (LOTEMAX SM) 0.38 % GEL Place 1 drop into the right eye in the morning and at bedtime. (Patient taking differently: Place 1 drop into the right eye daily.)   Loteprednol Etabonate (LOTEMAX SM) 0.38 % GEL Place 1 drop into the right eye in the morning and at bedtime.   Loteprednol  Etabonate (LOTEMAX SM) 0.38 % GEL Place 1 drop into the right eye in the morning and at bedtime.   magnesium oxide-pyridoxine (BEELITH) 362-20 MG TABS Take 1 tablet by mouth every evening.    metoprolol succinate (TOPROL-XL) 25 MG 24 hr tablet 1 tablet Orally Once a day 30 day(s)   montelukast (SINGULAIR) 10 MG tablet TAKE 1 TABLET (10 MG TOTAL) BY MOUTH AT BEDTIME.   Naltrexone-buPROPion HCl ER (CONTRAVE) 8-90 MG TB12 Take 1 tablet by mouth in the morning for a week, then one tablet by mouth twice daily for a week, then two tablets by mouth in the morning and one tablet in the evening for a week, then two tablets by mouth in the morning and two tablets by mouth in the evening thereafter.   Naltrexone-buPROPion HCl ER (CONTRAVE) 8-90 MG TB12 Take 2 tablets twice a day by oral route for 90 days.   Norethindrone Acetate-Ethinyl Estradiol (LARIN 1.5/30) 1.5-30 MG-MCG tablet TAKE 1 TABLET BY MOUTH DAILY (CONTINUOUS ACTIVE PILLS)   potassium citrate (UROCIT-K) 10 MEQ (1080 MG) SR tablet Take 2 tablets (20 mEq total) by mouth 2 times daily. Take two tablets by mouth in am  and pm   potassium citrate (UROCIT-K) 10 MEQ (1080 MG) SR tablet Take 2 tablets (20 mEq total) by mouth 2 times daily. Take two tablets by mouth in am and pm   predniSONE (DELTASONE) 20 MG tablet Take '40mg'$  the day before and '40mg'$  the day off for RUSH protocol.   senna-docusate (SENOKOT-S) 8.6-50 MG tablet Take 2 tablets by mouth at bedtime. For AFTER surgery, do not take if having diarrhea   timolol (TIMOPTIC) 0.5 % ophthalmic solution Place 1 drop into the right eye daily.   timolol (TIMOPTIC) 0.5 % ophthalmic solution Place 1 drop into the right eye daily.   timolol (TIMOPTIC) 0.5 % ophthalmic solution Place 1 drop into the right eye daily.   valACYclovir (VALTREX) 1000 MG tablet Take 1 tablet (1,000 mg total) by mouth daily.   valACYclovir (VALTREX) 1000 MG tablet Take 1 tablet (1,000 mg total) by mouth daily.   valACYclovir (VALTREX) 1000 MG tablet Take 1 tablet (1,000 mg total) by mouth daily.   valACYclovir (VALTREX) 1000 MG tablet Take 1 tablet by mouth daily.     ROS: Pertinent items noted in HPI and remainder of comprehensive ROS otherwise negative.  Labs/Other Tests and Data Reviewed:    Recent Labs: 12/04/2021: ALT 18; BUN 16; Creatinine, Ser 0.74; Hemoglobin 14.9; Platelets 386; Potassium 4.3; Sodium 139; TSH 0.780   Recent Lipid Panel Lab Results  Component Value Date/Time   CHOL 164 12/04/2021 08:46 AM   TRIG 211 (H) 12/04/2021 08:46 AM   TRIG 135 06/27/2021 08:40 AM   HDL 48 12/04/2021 08:46 AM   HDL 56 06/27/2021 08:40 AM   CHOLHDL 3.4 12/04/2021 08:46 AM   LDLCALC 74 12/04/2021 08:46 AM    Wt Readings from Last 3 Encounters:  05/27/22 170 lb (77.1 kg)  02/15/22 169 lb 8 oz (76.9 kg)  11/09/21 175 lb 6.4 oz (79.6 kg)     Exam:    Vital Signs:  BP 118/76   Pulse 79   Ht '5\' 4"'$  (1.626 m)   Wt 170 lb (77.1 kg)   SpO2 98%   BMI 29.18 kg/m    General appearance: alert and no distress Lungs: No respiratory difficulty Abdomen: Overweight Extremities:  extremities normal, atraumatic, no cyanosis or edema Skin: Skin color, texture, turgor normal. No rashes or  lesions Neurologic: Grossly normal Psych: Pleasant  ASSESSMENT & PLAN:    Mixed dyslipidemia, goal LDL <100 Statin intolerance -fatigue, brain fog weakness Family history of early stroke  Overall Ms. Warriner still maintains a target cholesterol that is at goal however has gone up some.  Probably related to weight gain including her elevated triglycerides.  She is working to reduce that.  She still seems to tolerate Repatha well.  We will continue her current therapies.  Plan follow-up with me annually or sooner as necessary.  COVID-19 Education: The signs and symptoms of COVID-19 were discussed with the patient and how to seek care for testing (follow up with PCP or arrange E-visit).  The importance of social distancing was discussed today.  Patient Risk:   After full review of this patients clinical status, I feel that they are at least moderate risk at this time.  Time:   Today, I have spent 25 minutes with the patient with telehealth technology discussing dyslipidemia.     Medication Adjustments/Labs and Tests Ordered: Current medicines are reviewed at length with the patient today.  Concerns regarding medicines are outlined above.   Tests Ordered: Orders Placed This Encounter  Procedures   Lipid panel     Medication Changes: No orders of the defined types were placed in this encounter.    Disposition:  in 1 year(s)  Pixie Casino, MD, [facility], [place] Director of the [facility] Lipid [facility] &  Cardiovascular Risk Reduction [facility] Diplomate of the American Board of Clinical Lipidology Attending Cardiologist  Direct Dial: [phone]  Fax: [phone]  Website:  [email or web address]  Pixie Casino, MD  05/27/2022 7:38 PM

## 2022-05-30 ENCOUNTER — Ambulatory Visit (INDEPENDENT_AMBULATORY_CARE_PROVIDER_SITE_OTHER): Payer: 59

## 2022-05-30 DIAGNOSIS — J309 Allergic rhinitis, unspecified: Secondary | ICD-10-CM | POA: Diagnosis not present

## 2022-05-30 NOTE — Progress Notes (Signed)
VIAL EXP 05-31-23 

## 2022-05-31 DIAGNOSIS — J3089 Other allergic rhinitis: Secondary | ICD-10-CM | POA: Diagnosis not present

## 2022-06-03 ENCOUNTER — Other Ambulatory Visit: Payer: Self-pay

## 2022-06-03 ENCOUNTER — Other Ambulatory Visit: Payer: Self-pay | Admitting: Allergy

## 2022-06-03 DIAGNOSIS — T380X5A Adverse effect of glucocorticoids and synthetic analogues, initial encounter: Secondary | ICD-10-CM | POA: Diagnosis not present

## 2022-06-03 DIAGNOSIS — H4061X0 Glaucoma secondary to drugs, right eye, stage unspecified: Secondary | ICD-10-CM | POA: Diagnosis not present

## 2022-06-03 MED ORDER — EPINEPHRINE 0.3 MG/0.3ML IJ SOAJ
INTRAMUSCULAR | 1 refills | Status: DC
  Filled 2022-06-03: qty 2, 30d supply, fill #0
  Filled 2022-08-02: qty 2, 30d supply, fill #1

## 2022-06-04 ENCOUNTER — Other Ambulatory Visit: Payer: Self-pay

## 2022-06-06 ENCOUNTER — Ambulatory Visit (INDEPENDENT_AMBULATORY_CARE_PROVIDER_SITE_OTHER): Payer: 59

## 2022-06-06 DIAGNOSIS — H0102B Squamous blepharitis left eye, upper and lower eyelids: Secondary | ICD-10-CM | POA: Diagnosis not present

## 2022-06-06 DIAGNOSIS — H16301 Unspecified interstitial keratitis, right eye: Secondary | ICD-10-CM | POA: Diagnosis not present

## 2022-06-06 DIAGNOSIS — H17821 Peripheral opacity of cornea, right eye: Secondary | ICD-10-CM | POA: Diagnosis not present

## 2022-06-06 DIAGNOSIS — H25011 Cortical age-related cataract, right eye: Secondary | ICD-10-CM | POA: Diagnosis not present

## 2022-06-06 DIAGNOSIS — H04123 Dry eye syndrome of bilateral lacrimal glands: Secondary | ICD-10-CM | POA: Diagnosis not present

## 2022-06-06 DIAGNOSIS — H0102A Squamous blepharitis right eye, upper and lower eyelids: Secondary | ICD-10-CM | POA: Diagnosis not present

## 2022-06-06 DIAGNOSIS — H401112 Primary open-angle glaucoma, right eye, moderate stage: Secondary | ICD-10-CM | POA: Diagnosis not present

## 2022-06-06 DIAGNOSIS — J309 Allergic rhinitis, unspecified: Secondary | ICD-10-CM | POA: Diagnosis not present

## 2022-06-07 ENCOUNTER — Other Ambulatory Visit: Payer: Self-pay

## 2022-06-11 ENCOUNTER — Other Ambulatory Visit: Payer: Self-pay

## 2022-06-12 ENCOUNTER — Other Ambulatory Visit: Payer: Self-pay

## 2022-06-12 MED ORDER — CONTRAVE 8-90 MG PO TB12
ORAL_TABLET | ORAL | 0 refills | Status: DC
  Filled 2022-06-12 – 2022-06-20 (×5): qty 360, 90d supply, fill #0
  Filled 2022-06-21: qty 30, 6d supply, fill #0
  Filled 2022-06-21: qty 330, 84d supply, fill #0
  Filled 2022-06-21 (×2): qty 360, 90d supply, fill #0

## 2022-06-13 ENCOUNTER — Ambulatory Visit (INDEPENDENT_AMBULATORY_CARE_PROVIDER_SITE_OTHER): Payer: 59

## 2022-06-13 DIAGNOSIS — J309 Allergic rhinitis, unspecified: Secondary | ICD-10-CM

## 2022-06-18 ENCOUNTER — Ambulatory Visit (INDEPENDENT_AMBULATORY_CARE_PROVIDER_SITE_OTHER): Payer: 59

## 2022-06-18 DIAGNOSIS — J309 Allergic rhinitis, unspecified: Secondary | ICD-10-CM | POA: Diagnosis not present

## 2022-06-19 ENCOUNTER — Other Ambulatory Visit: Payer: Self-pay

## 2022-06-20 ENCOUNTER — Other Ambulatory Visit: Payer: Self-pay

## 2022-06-21 ENCOUNTER — Other Ambulatory Visit: Payer: Self-pay

## 2022-06-25 ENCOUNTER — Other Ambulatory Visit: Payer: Self-pay

## 2022-06-26 ENCOUNTER — Other Ambulatory Visit: Payer: Self-pay

## 2022-06-26 MED ORDER — PHENTERMINE HCL 37.5 MG PO TABS
ORAL_TABLET | ORAL | 0 refills | Status: DC
  Filled 2022-06-26: qty 15, 30d supply, fill #0

## 2022-06-28 ENCOUNTER — Ambulatory Visit (INDEPENDENT_AMBULATORY_CARE_PROVIDER_SITE_OTHER): Payer: 59 | Admitting: *Deleted

## 2022-06-28 DIAGNOSIS — J309 Allergic rhinitis, unspecified: Secondary | ICD-10-CM

## 2022-07-03 DIAGNOSIS — L299 Pruritus, unspecified: Secondary | ICD-10-CM | POA: Diagnosis not present

## 2022-07-03 DIAGNOSIS — L551 Sunburn of second degree: Secondary | ICD-10-CM | POA: Diagnosis not present

## 2022-07-04 IMAGING — CT CT CARDIAC CORONARY ARTERY CALCIUM SCORE
1 of 2 series · 10 of 20 positions shown, 13 images · non-contrast
Comparison: None.
COMPARISON: None.
COMPARISON: None.

Addendum:
EXAM:
OVER-READ INTERPRETATION  CT CHEST

The following report is an over-read performed by radiologist Dr.
Lekarinfo Cusic [REDACTED] on 03/06/2021. This over-read
does not include interpretation of cardiac or coronary anatomy or
pathology. The calcium score interpretation by the cardiologist is
attached.
CLINICAL DATA: Cardiovascular Disease Risk stratification
Coronary Calcium Score
TECHNIQUE: A gated, non-contrast computed tomography scan of the heart was
performed using 3mm slice thickness. Axial images were analyzed on a
dedicated workstation. Calcium scoring of the coronary arteries was
performed using the Agatston method.
CLINICAL DATA: Risk stratification
TECHNIQUE: The patient was scanned on a Siemens Somatom Definition AS scanner.
Axial non-contrast 3 mm slices were carried out through the heart.
The data set was analyzed on a dedicated work station and scored
using the Agatson method.

[Series 2: casc 3.0 i36f 2 bestdiast 75 % · axial · 0.39mm/px · z∈[+676,+802]mm · 10 of 78 slices shown, 13 images]
[im 8/78  vessel]
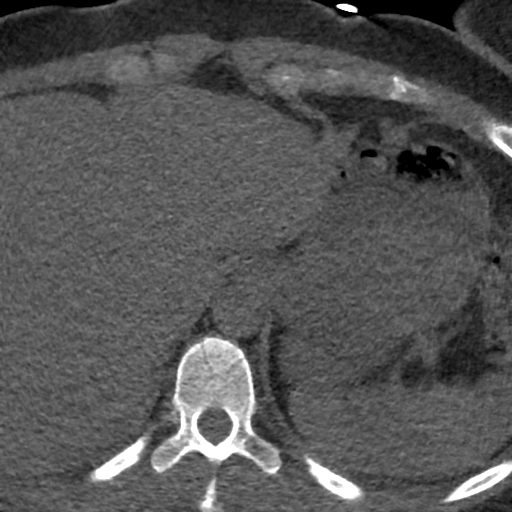
[im 8/78  lung]
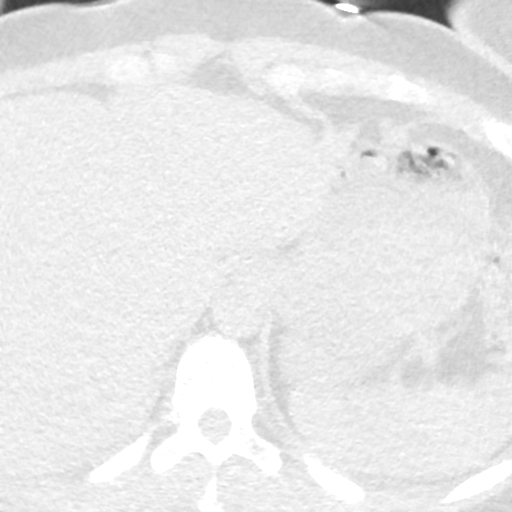
[im 15/78  vessel]
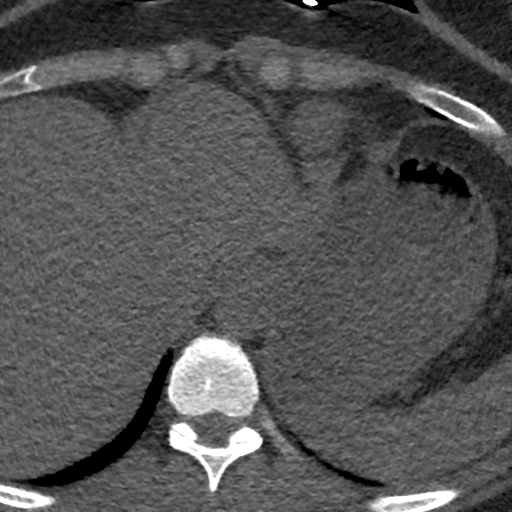
[im 22/78  vessel]
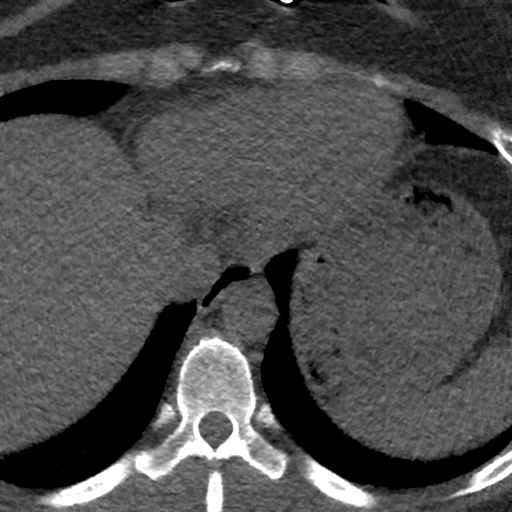
[im 29/78  vessel]
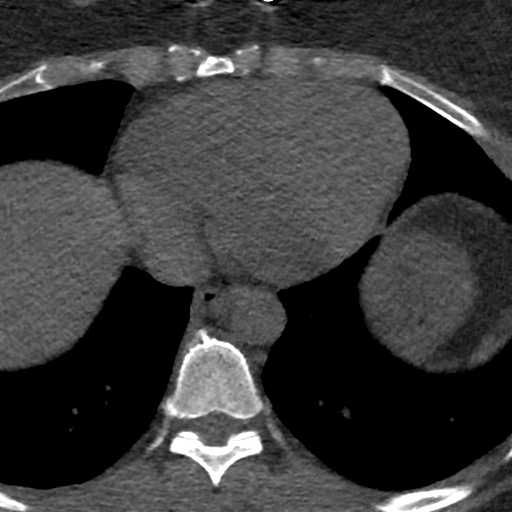
[im 36/78  vessel]
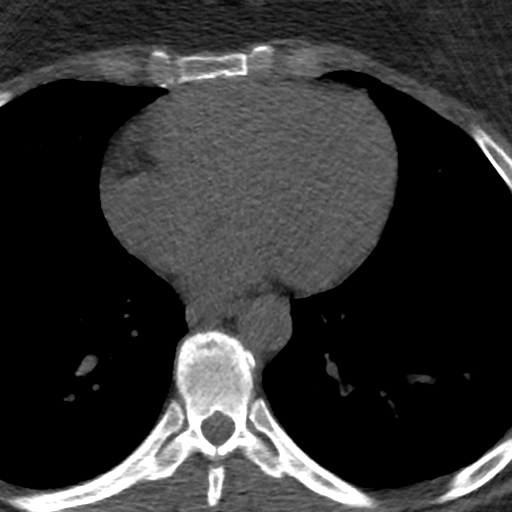
[im 36/78  lung]
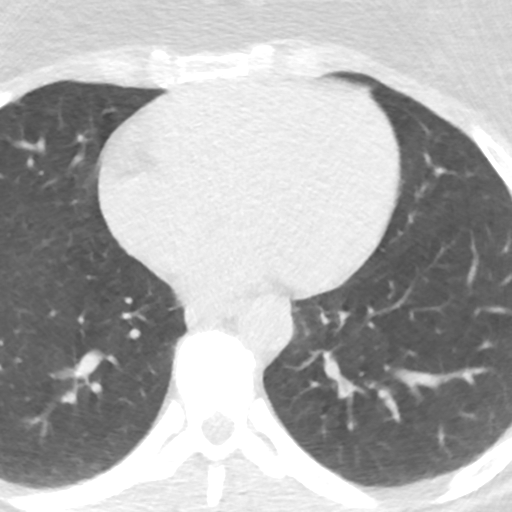
[im 43/78  vessel]
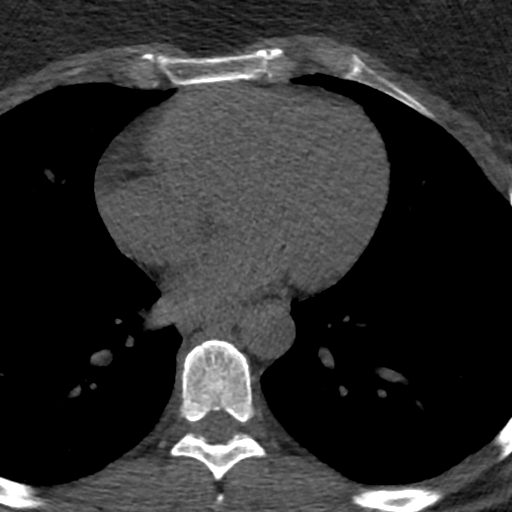
[im 50/78  vessel]
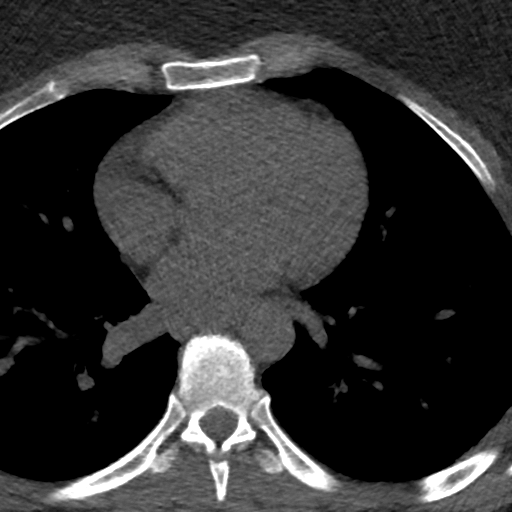
[im 57/78  vessel]
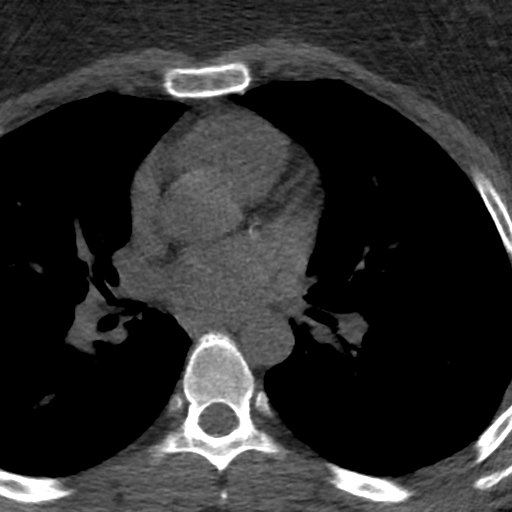
[im 64/78  vessel]
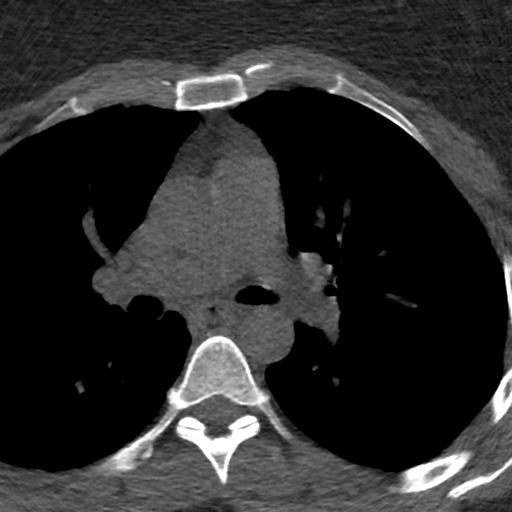
[im 64/78  lung]
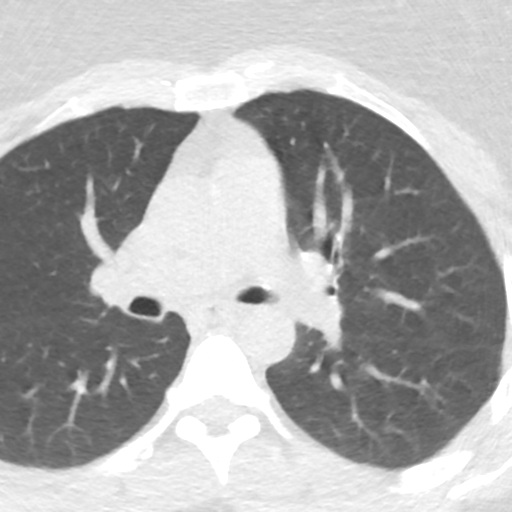
[im 71/78  vessel]
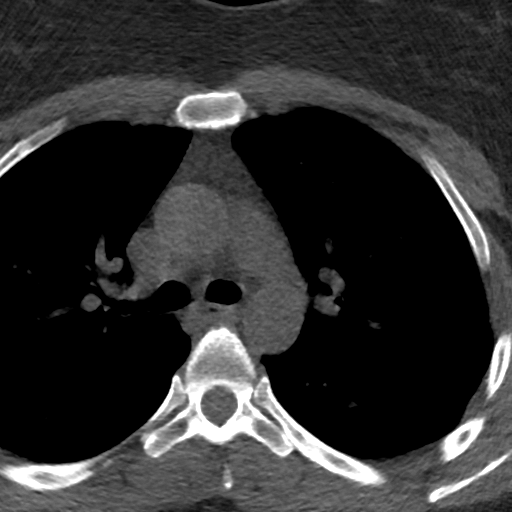

[10 of 20 positions shown; findings below may reference images not displayed]

FINDINGS: Vascular: Normal aortic caliber.

Mediastinum/Nodes: No imaged thoracic adenopathy.

Lungs/Pleura: No pleural fluid. 3 mm nodule on the right minor
fissure on [DATE].

Upper Abdomen: Normal imaged portions of the liver, spleen, stomach.

Musculoskeletal: No acute osseous abnormality.
IMPRESSION: 1. No acute findings in the imaged extracardiac chest.
2. 3 mm nodule on the right minor fissure, likely a subpleural lymph
node. No follow-up needed if patient is low-risk. Non-contrast chest
CT can be considered in 12 months if patient is high-risk. This
recommendation follows the consensus statement: Guidelines for
Management of Incidental Pulmonary Nodules Detected on CT Images:
FINDINGS: Coronary arteries: Normal origins.

Coronary Calcium Score:

Left main: 0

Left anterior descending artery: 0

Left circumflex artery: 0

Right coronary artery: 0

Total: 0

Percentile: 0

Pericardium: Normal.

Ascending Aorta: Normal caliber.

Non-cardiac: See separate report from [REDACTED].
IMPRESSION: Coronary calcium score of 0. This was 0 percentile for age-, race-,
and sex-matched controls.



If CAC=0, it is reasonable to withhold statin therapy and reassess
in 5 to 10 years, as long as higher risk conditions are absent
(diabetes mellitus, family history of premature CHD in first degree
relatives (males <55 years; females <65 years), cigarette smoking,
or LDL >=190 mg/dL).

If CAC is 1 to 99, it is reasonable to initiate statin therapy for
patients >=55 years of age.

If CAC is >=100 or >=75th percentile, it is reasonable to initiate
statin therapy at any age.

Cardiology referral should be considered for patients with CAC
scores >=400 or >=75th percentile.

*7447 AHA/ACC/AACVPR/AAPA/ABC/[place]/LYTTLE/VOIGT/Miek/CTRANS/MOHAMMADRASOUL/CUNGKRING
Guideline on the Management of Blood Cholesterol: A Report of the
American College of Cardiology/American Heart Association Task Force
on Clinical Practice Guidelines. J Am Coll Cardiol.
2942;73(24):0115-0747.
FINDINGS: Non-cardiac: See separate report from [REDACTED].

Ascending Aorta: Normal size

Pericardium: Normal

Coronary arteries: Normal origin of left and right coronary
arteries. Distribution of arterial calcifications if present, as
noted below;

LM 0

LAD 0

LCx 0

RCA 0

Total 0

IMPRESSION AND RECOMMENDATION:
1. Normal coronary calcium score of 0. Patient is low risk for
coronary events.

2.  CAC 0, ARJENNE SADAL0.

3.  Continue heart healthy lifestyle and risk factor modification.

Nooshin Field

*** End of Addendum ***
Addendum:
EXAM:
OVER-READ INTERPRETATION  CT CHEST

The following report is an over-read performed by radiologist Dr.
Lekarinfo Cusic [REDACTED] on 03/06/2021. This over-read
does not include interpretation of cardiac or coronary anatomy or
pathology. The calcium score interpretation by the cardiologist is
attached.
FINDINGS: Vascular: Normal aortic caliber.

Mediastinum/Nodes: No imaged thoracic adenopathy.

Lungs/Pleura: No pleural fluid. 3 mm nodule on the right minor
fissure on [DATE].

Upper Abdomen: Normal imaged portions of the liver, spleen, stomach.

Musculoskeletal: No acute osseous abnormality.
IMPRESSION: 1. No acute findings in the imaged extracardiac chest.
2. 3 mm nodule on the right minor fissure, likely a subpleural lymph
node. No follow-up needed if patient is low-risk. Non-contrast chest
CT can be considered in 12 months if patient is high-risk. This
recommendation follows the consensus statement: Guidelines for
Management of Incidental Pulmonary Nodules Detected on CT Images:
FINDINGS: Coronary arteries: Normal origins.

Coronary Calcium Score:

Left main: 0

Left anterior descending artery: 0

Left circumflex artery: 0

Right coronary artery: 0

Total: 0

Percentile: 0

Pericardium: Normal.

Ascending Aorta: Normal caliber.

Non-cardiac: See separate report from [REDACTED].
IMPRESSION: Coronary calcium score of 0. This was 0 percentile for age-, race-,
and sex-matched controls.



If CAC=0, it is reasonable to withhold statin therapy and reassess
in 5 to 10 years, as long as higher risk conditions are absent
(diabetes mellitus, family history of premature CHD in first degree
relatives (males <55 years; females <65 years), cigarette smoking,
or LDL >=190 mg/dL).

If CAC is 1 to 99, it is reasonable to initiate statin therapy for
patients >=55 years of age.

If CAC is >=100 or >=75th percentile, it is reasonable to initiate
statin therapy at any age.

Cardiology referral should be considered for patients with CAC
scores >=400 or >=75th percentile.

*7447 AHA/ACC/AACVPR/AAPA/ABC/[place]/LYTTLE/VOIGT/Miek/CTRANS/MOHAMMADRASOUL/CUNGKRING
Guideline on the Management of Blood Cholesterol: A Report of the
American College of Cardiology/American Heart Association Task Force
on Clinical Practice Guidelines. J Am Coll Cardiol.
2942;73(24):0115-0747.

*** End of Addendum ***
EXAM:
OVER-READ INTERPRETATION  CT CHEST

The following report is an over-read performed by radiologist Dr.
Lekarinfo Cusic [REDACTED] on 03/06/2021. This over-read
does not include interpretation of cardiac or coronary anatomy or
pathology. The calcium score interpretation by the cardiologist is
attached.
FINDINGS: Vascular: Normal aortic caliber.

Mediastinum/Nodes: No imaged thoracic adenopathy.

Lungs/Pleura: No pleural fluid. 3 mm nodule on the right minor
fissure on [DATE].

Upper Abdomen: Normal imaged portions of the liver, spleen, stomach.

Musculoskeletal: No acute osseous abnormality.
IMPRESSION: 1. No acute findings in the imaged extracardiac chest.
2. 3 mm nodule on the right minor fissure, likely a subpleural lymph
node. No follow-up needed if patient is low-risk. Non-contrast chest
CT can be considered in 12 months if patient is high-risk. This
recommendation follows the consensus statement: Guidelines for
Management of Incidental Pulmonary Nodules Detected on CT Images:

## 2022-07-08 ENCOUNTER — Ambulatory Visit (INDEPENDENT_AMBULATORY_CARE_PROVIDER_SITE_OTHER): Payer: 59 | Admitting: *Deleted

## 2022-07-08 DIAGNOSIS — J309 Allergic rhinitis, unspecified: Secondary | ICD-10-CM

## 2022-07-16 ENCOUNTER — Other Ambulatory Visit: Payer: Self-pay

## 2022-07-16 DIAGNOSIS — H4061X0 Glaucoma secondary to drugs, right eye, stage unspecified: Secondary | ICD-10-CM | POA: Diagnosis not present

## 2022-07-16 DIAGNOSIS — T380X5D Adverse effect of glucocorticoids and synthetic analogues, subsequent encounter: Secondary | ICD-10-CM | POA: Diagnosis not present

## 2022-07-16 DIAGNOSIS — B0051 Herpesviral iridocyclitis: Secondary | ICD-10-CM | POA: Diagnosis not present

## 2022-07-16 MED ORDER — AZATHIOPRINE 50 MG PO TABS
50.0000 mg | ORAL_TABLET | Freq: Three times a day (TID) | ORAL | 2 refills | Status: AC
  Filled 2022-07-16 – 2022-11-12 (×2): qty 270, 90d supply, fill #0
  Filled 2023-03-15: qty 270, 90d supply, fill #1

## 2022-07-16 MED ORDER — VALACYCLOVIR HCL 1 G PO TABS
1000.0000 mg | ORAL_TABLET | Freq: Every day | ORAL | 11 refills | Status: DC
  Filled 2022-07-16: qty 90, 90d supply, fill #0
  Filled 2022-11-19: qty 90, 90d supply, fill #1
  Filled 2023-02-16: qty 90, 90d supply, fill #2

## 2022-07-17 ENCOUNTER — Ambulatory Visit (INDEPENDENT_AMBULATORY_CARE_PROVIDER_SITE_OTHER): Payer: 59 | Admitting: *Deleted

## 2022-07-17 DIAGNOSIS — J309 Allergic rhinitis, unspecified: Secondary | ICD-10-CM

## 2022-07-22 ENCOUNTER — Ambulatory Visit (INDEPENDENT_AMBULATORY_CARE_PROVIDER_SITE_OTHER): Payer: 59

## 2022-07-22 DIAGNOSIS — J309 Allergic rhinitis, unspecified: Secondary | ICD-10-CM | POA: Diagnosis not present

## 2022-07-24 ENCOUNTER — Other Ambulatory Visit: Payer: Self-pay

## 2022-07-25 ENCOUNTER — Other Ambulatory Visit: Payer: Self-pay

## 2022-07-26 ENCOUNTER — Other Ambulatory Visit: Payer: Self-pay

## 2022-07-26 MED ORDER — PHENTERMINE HCL 37.5 MG PO TABS
ORAL_TABLET | ORAL | 0 refills | Status: DC
  Filled 2022-07-26: qty 15, 30d supply, fill #0

## 2022-07-28 ENCOUNTER — Other Ambulatory Visit: Payer: Self-pay

## 2022-07-28 MED ORDER — PHENTERMINE HCL 37.5 MG PO TABS
ORAL_TABLET | ORAL | 0 refills | Status: DC
  Filled 2022-08-21: qty 15, 30d supply, fill #0

## 2022-07-29 ENCOUNTER — Other Ambulatory Visit: Payer: Self-pay

## 2022-07-29 MED ORDER — PHENTERMINE HCL 37.5 MG PO TABS
18.7500 mg | ORAL_TABLET | Freq: Every day | ORAL | 0 refills | Status: DC
  Filled 2022-09-24: qty 15, 30d supply, fill #0

## 2022-07-31 DIAGNOSIS — H2511 Age-related nuclear cataract, right eye: Secondary | ICD-10-CM | POA: Diagnosis not present

## 2022-07-31 DIAGNOSIS — H4061X0 Glaucoma secondary to drugs, right eye, stage unspecified: Secondary | ICD-10-CM | POA: Diagnosis not present

## 2022-07-31 DIAGNOSIS — B0052 Herpesviral keratitis: Secondary | ICD-10-CM | POA: Diagnosis not present

## 2022-07-31 DIAGNOSIS — H209 Unspecified iridocyclitis: Secondary | ICD-10-CM | POA: Diagnosis not present

## 2022-07-31 DIAGNOSIS — T380X5A Adverse effect of glucocorticoids and synthetic analogues, initial encounter: Secondary | ICD-10-CM | POA: Diagnosis not present

## 2022-08-02 ENCOUNTER — Other Ambulatory Visit: Payer: Self-pay | Admitting: Allergy

## 2022-08-02 ENCOUNTER — Other Ambulatory Visit: Payer: Self-pay

## 2022-08-02 MED ORDER — ALBUTEROL SULFATE HFA 108 (90 BASE) MCG/ACT IN AERS
INHALATION_SPRAY | RESPIRATORY_TRACT | 1 refills | Status: DC
  Filled 2022-08-02: qty 13.4, 60d supply, fill #0

## 2022-08-02 MED FILL — Montelukast Sodium Tab 10 MG (Base Equiv): ORAL | 90 days supply | Qty: 90 | Fill #1 | Status: AC

## 2022-08-05 ENCOUNTER — Telehealth: Payer: Self-pay | Admitting: *Deleted

## 2022-08-05 ENCOUNTER — Ambulatory Visit (INDEPENDENT_AMBULATORY_CARE_PROVIDER_SITE_OTHER): Payer: 59 | Admitting: *Deleted

## 2022-08-05 ENCOUNTER — Other Ambulatory Visit: Payer: Self-pay

## 2022-08-05 DIAGNOSIS — J309 Allergic rhinitis, unspecified: Secondary | ICD-10-CM | POA: Diagnosis not present

## 2022-08-05 NOTE — Telephone Encounter (Signed)
Patient came in for her allergy injections and stated that it was discussed that when she got further along with her injections she could start getting her Venom and Allergy injections together, does this sound familiar?

## 2022-08-06 NOTE — Telephone Encounter (Signed)
Patients allergy flow sheet and venom flow sheet has been updated to reflect these changes. Called and left a detailed voicemail advising patient per her consent.

## 2022-08-12 ENCOUNTER — Ambulatory Visit: Payer: 59

## 2022-08-14 ENCOUNTER — Other Ambulatory Visit: Payer: Self-pay

## 2022-08-19 ENCOUNTER — Ambulatory Visit (INDEPENDENT_AMBULATORY_CARE_PROVIDER_SITE_OTHER): Payer: 59

## 2022-08-19 DIAGNOSIS — T63441D Toxic effect of venom of bees, accidental (unintentional), subsequent encounter: Secondary | ICD-10-CM | POA: Diagnosis not present

## 2022-08-20 ENCOUNTER — Ambulatory Visit: Payer: 59

## 2022-08-22 ENCOUNTER — Other Ambulatory Visit: Payer: Self-pay

## 2022-09-03 ENCOUNTER — Ambulatory Visit (INDEPENDENT_AMBULATORY_CARE_PROVIDER_SITE_OTHER): Payer: 59

## 2022-09-03 DIAGNOSIS — J309 Allergic rhinitis, unspecified: Secondary | ICD-10-CM | POA: Diagnosis not present

## 2022-09-17 ENCOUNTER — Telehealth: Payer: Self-pay | Admitting: *Deleted

## 2022-09-17 ENCOUNTER — Ambulatory Visit (INDEPENDENT_AMBULATORY_CARE_PROVIDER_SITE_OTHER): Payer: 59 | Admitting: *Deleted

## 2022-09-17 DIAGNOSIS — J309 Allergic rhinitis, unspecified: Secondary | ICD-10-CM | POA: Diagnosis not present

## 2022-09-17 NOTE — Telephone Encounter (Signed)
Patient was wanting to know if it would be ok for her to build up on a faster schedule when she starts new Red vials such as the .10, .30, .50?

## 2022-09-18 DIAGNOSIS — J3089 Other allergic rhinitis: Secondary | ICD-10-CM | POA: Diagnosis not present

## 2022-09-18 NOTE — Progress Notes (Signed)
VIALS EXP 09-19-23 

## 2022-09-19 NOTE — Telephone Encounter (Signed)
Left a detailed message informing patient of the change in her build up schedule. Informed patient that if she had an questions or concerns to reach out to the office.

## 2022-09-24 ENCOUNTER — Other Ambulatory Visit: Payer: Self-pay

## 2022-09-24 ENCOUNTER — Other Ambulatory Visit: Payer: Self-pay | Admitting: Family Medicine

## 2022-09-24 ENCOUNTER — Other Ambulatory Visit: Payer: Self-pay | Admitting: Allergy

## 2022-09-24 DIAGNOSIS — I10 Essential (primary) hypertension: Secondary | ICD-10-CM

## 2022-09-25 ENCOUNTER — Other Ambulatory Visit: Payer: Self-pay | Admitting: Allergy

## 2022-09-25 ENCOUNTER — Other Ambulatory Visit: Payer: Self-pay

## 2022-09-25 MED ORDER — PHENTERMINE HCL 37.5 MG PO TABS
18.7500 mg | ORAL_TABLET | Freq: Every day | ORAL | 0 refills | Status: DC
  Filled 2022-09-25 – 2022-11-12 (×2): qty 15, 30d supply, fill #0

## 2022-09-26 ENCOUNTER — Other Ambulatory Visit: Payer: Self-pay

## 2022-09-26 MED FILL — Epinephrine Solution Auto-injector 0.3 MG/0.3ML (1:1000): INTRAMUSCULAR | 30 days supply | Qty: 2 | Fill #0 | Status: AC

## 2022-09-26 MED FILL — Albuterol Sulfate Inhal Aero 108 MCG/ACT (90MCG Base Equiv): RESPIRATORY_TRACT | 25 days supply | Qty: 6.7 | Fill #0 | Status: AC

## 2022-09-30 ENCOUNTER — Ambulatory Visit (INDEPENDENT_AMBULATORY_CARE_PROVIDER_SITE_OTHER): Payer: 59

## 2022-09-30 DIAGNOSIS — J309 Allergic rhinitis, unspecified: Secondary | ICD-10-CM

## 2022-10-17 ENCOUNTER — Ambulatory Visit (INDEPENDENT_AMBULATORY_CARE_PROVIDER_SITE_OTHER): Payer: 59

## 2022-10-17 DIAGNOSIS — J309 Allergic rhinitis, unspecified: Secondary | ICD-10-CM

## 2022-10-28 ENCOUNTER — Ambulatory Visit (INDEPENDENT_AMBULATORY_CARE_PROVIDER_SITE_OTHER): Payer: 59

## 2022-10-28 DIAGNOSIS — J309 Allergic rhinitis, unspecified: Secondary | ICD-10-CM | POA: Diagnosis not present

## 2022-11-04 ENCOUNTER — Ambulatory Visit (INDEPENDENT_AMBULATORY_CARE_PROVIDER_SITE_OTHER): Payer: 59

## 2022-11-04 DIAGNOSIS — J309 Allergic rhinitis, unspecified: Secondary | ICD-10-CM

## 2022-11-11 ENCOUNTER — Other Ambulatory Visit: Payer: Self-pay | Admitting: Allergy

## 2022-11-11 ENCOUNTER — Ambulatory Visit (INDEPENDENT_AMBULATORY_CARE_PROVIDER_SITE_OTHER): Payer: 59

## 2022-11-11 DIAGNOSIS — T63441D Toxic effect of venom of bees, accidental (unintentional), subsequent encounter: Secondary | ICD-10-CM

## 2022-11-12 ENCOUNTER — Ambulatory Visit: Payer: 59

## 2022-11-12 ENCOUNTER — Other Ambulatory Visit: Payer: Self-pay

## 2022-11-12 ENCOUNTER — Telehealth: Payer: Self-pay

## 2022-11-12 MED ORDER — POTASSIUM CITRATE ER 10 MEQ (1080 MG) PO TBCR
20.0000 meq | EXTENDED_RELEASE_TABLET | Freq: Two times a day (BID) | ORAL | 3 refills | Status: DC
  Filled 2022-11-12: qty 360, 90d supply, fill #0
  Filled 2023-02-16: qty 120, 30d supply, fill #1

## 2022-11-12 MED ORDER — MONTELUKAST SODIUM 10 MG PO TABS
10.0000 mg | ORAL_TABLET | Freq: Every day | ORAL | 0 refills | Status: DC
  Filled 2022-11-12: qty 30, 30d supply, fill #0

## 2022-11-12 NOTE — Telephone Encounter (Signed)
Patient called in - DOB/Pharmacy verified - requested medication refill on Montelukast (Singulair) 10 mg be sent to [facility].  Patient advised courtesy medication refill will be done - make sure to keep 12/04/22 @ 3:40 pm yearly appt w/Dr. Nelva Bush for future refills.  Patient verbalized understanding, no further questions.

## 2022-11-13 ENCOUNTER — Other Ambulatory Visit: Payer: Self-pay

## 2022-11-15 ENCOUNTER — Encounter: Payer: 59 | Admitting: Family Medicine

## 2022-11-19 ENCOUNTER — Other Ambulatory Visit: Payer: Self-pay

## 2022-11-19 MED ORDER — NORETHINDRONE ACET-ETHINYL EST 1.5-30 MG-MCG PO TABS
1.0000 | ORAL_TABLET | Freq: Every day | ORAL | 0 refills | Status: DC
  Filled 2022-11-19: qty 84, 84d supply, fill #0

## 2022-11-22 ENCOUNTER — Ambulatory Visit (INDEPENDENT_AMBULATORY_CARE_PROVIDER_SITE_OTHER): Payer: 59 | Admitting: *Deleted

## 2022-11-22 DIAGNOSIS — J309 Allergic rhinitis, unspecified: Secondary | ICD-10-CM | POA: Diagnosis not present

## 2022-12-02 ENCOUNTER — Ambulatory Visit (INDEPENDENT_AMBULATORY_CARE_PROVIDER_SITE_OTHER): Payer: 59 | Admitting: *Deleted

## 2022-12-02 DIAGNOSIS — J309 Allergic rhinitis, unspecified: Secondary | ICD-10-CM

## 2022-12-04 ENCOUNTER — Other Ambulatory Visit: Payer: Self-pay

## 2022-12-04 ENCOUNTER — Encounter: Payer: Self-pay | Admitting: Allergy

## 2022-12-04 ENCOUNTER — Ambulatory Visit (INDEPENDENT_AMBULATORY_CARE_PROVIDER_SITE_OTHER): Payer: 59 | Admitting: Allergy

## 2022-12-04 VITALS — BP 150/96 | HR 87 | Temp 98.3°F | Ht 64.0 in | Wt 164.9 lb

## 2022-12-04 DIAGNOSIS — T63441D Toxic effect of venom of bees, accidental (unintentional), subsequent encounter: Secondary | ICD-10-CM

## 2022-12-04 DIAGNOSIS — R030 Elevated blood-pressure reading, without diagnosis of hypertension: Secondary | ICD-10-CM | POA: Diagnosis not present

## 2022-12-04 DIAGNOSIS — J4540 Moderate persistent asthma, uncomplicated: Secondary | ICD-10-CM | POA: Diagnosis not present

## 2022-12-04 DIAGNOSIS — J301 Allergic rhinitis due to pollen: Secondary | ICD-10-CM

## 2022-12-04 MED ORDER — PULMICORT FLEXHALER 180 MCG/ACT IN AEPB
2.0000 | INHALATION_SPRAY | Freq: Two times a day (BID) | RESPIRATORY_TRACT | 5 refills | Status: DC
  Filled 2022-12-04 – 2023-10-09 (×4): qty 3, 90d supply, fill #0
  Filled 2023-10-13: qty 1, 30d supply, fill #0
  Filled 2023-10-17 – 2023-10-21 (×2): qty 3, 90d supply, fill #0
  Filled ????-??-??: fill #0

## 2022-12-04 MED ORDER — MONTELUKAST SODIUM 10 MG PO TABS
10.0000 mg | ORAL_TABLET | Freq: Every day | ORAL | 5 refills | Status: DC
  Filled 2022-12-04: qty 30, 30d supply, fill #0
  Filled 2022-12-06: qty 90, 90d supply, fill #0
  Filled 2023-03-08: qty 90, 90d supply, fill #1

## 2022-12-04 MED ORDER — ALBUTEROL SULFATE HFA 108 (90 BASE) MCG/ACT IN AERS
2.0000 | INHALATION_SPRAY | Freq: Four times a day (QID) | RESPIRATORY_TRACT | 1 refills | Status: DC
  Filled 2022-12-04: qty 6.7, 30d supply, fill #0

## 2022-12-04 MED ORDER — EPINEPHRINE 0.3 MG/0.3ML IJ SOAJ
0.3000 mg | INTRAMUSCULAR | 1 refills | Status: DC | PRN
  Filled 2022-12-04: qty 2, 30d supply, fill #0
  Filled 2023-08-11: qty 2, 30d supply, fill #1

## 2022-12-04 NOTE — Patient Instructions (Signed)
Asthma - doing well - Action plan (in case of respiratory illness or flare) use:  Pulmicort 180-  2-3 puffs twice a day for 1-2 weeks or until symptoms have resolved - Advise may be good idea to take Pulmicort 2 puffs twice a day prior to stopping Imuran if that decision in made and continue for about a week or so after to ensure stabilization of the airway - Continue Montelukast 10 mg once a day for a coughing or wheezing - Ventolin 2 puffs every 4-6 hours if needed for wheezing, coughing, shortness of breath or chest tightness.  Monitor frequency of use.  Asthma control goals:  Full participation in all desired activities (may need albuterol before activity) Albuterol use two time or less a week on average (not counting use with activity) Cough interfering with sleep two time or less a month Oral steroids no more than once a year No hospitalizations  Allergic rhinitis   - Zyrtec 10 mg once a day for runny nose  - Rhinocort 1-2 spray per nostril twice a day for stuffy nose  - Montelukast as above  - Continue allergen immunotherapy per protocol  - Advise once she is well into maintenance (likely this winter/sprin 2025) will be able to reduce medications to as needed use and see how she does  Venom allergy  - continue venom injections at maintenance every 12 weeks  - If you have an insect sting take Benadryl 50 mg every 4 hours and if you have life-threatening symptoms inject him with EpiPen 0.3 mg  Elevated BP  - continue to monitor at home  - discuss with PCP regarding medication management  Follow-up yearly or sooner if needed

## 2022-12-04 NOTE — Progress Notes (Unsigned)
Follow-up Note  RE: Rachel Hooper MRN: [id number] DOB: 1981/09/02 Date of Office Visit: 12/04/2022   History of present illness: Rachel Hooper is a 42 y.o. female presenting today for follow-up of asthma, allergic rhinitis, hymenoptera allergy.  She was last seen in the office on 02/15/22 by Dr Simona Huh for rapid desensitization.  She continues on allergen immunotherapy at this time at maintenance dosing. She continues to on venom immunotherapy at maintenance dosing of every 12 weeks.   She is tolerating both forms of immunotherapy without large local or systemic symptoms.  She has access to an epinephrine device.   She does continue to use Zyrtec and she also uses Singulair daily.  She has Rhinocort or a similar steroid nasal spray for congestion control.  Send an asthma standpoint she does continue on Pulmicort and she has access to her rescue inhaler.  She is wondering if she still needs to continue on all of her maintenance medications.  She has not had any asthma flares or need for systemic steroids.  At this time allergy symptoms are controlled.  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  She has noted since she has been on imuran she has not had any issues with her asthma.  She does note today having headache and some dizziness.  Her BP has been a bit higher than usual.  She will be discussing with PCP and she has a home cuff to monitor.   Review of systems: Review of Systems  Constitutional: Negative.   HENT: Negative.    Eyes: Negative.   Respiratory: Negative.    Cardiovascular: Negative.   Gastrointestinal: Negative.   Musculoskeletal: Negative.   Skin: Negative.   Allergic/Immunologic: Negative.   Neurological:        See HPI     All other systems negative unless noted above in HPI  Past medical/social/surgical/family history have been reviewed and are unchanged  unless specifically indicated below.  No changes  Medication List: Current Outpatient Medications  Medication Sig Dispense Refill   azaTHIOprine (IMURAN) 50 MG tablet Take 1 tablet (50 mg total) by mouth 3 (three) times daily. 270 tablet 2   budesonide (RHINOCORT ALLERGY) 32 MCG/ACT nasal spray ONE SPRAY EACH NOSTRIL TWICE A DAY FOR NASAL CONGESTION OR DRAINAGE. (Patient taking differently: Place 1 spray into both nostrils 2 (two) times daily.) 8.6 g 5   cetirizine (ZYRTEC) 10 MG tablet Take 1 tablet (10 mg total) by mouth at bedtime. 30 tablet 5   Evolocumab (REPATHA SURECLICK) XX123456 MG/ML SOAJ Inject 1 Dose into the skin every 14 (fourteen) days. 6 mL 3   Loteprednol Etabonate (LOTEMAX SM) 0.38 % GEL Place 1 drop into the right eye in the morning and at bedtime. (Patient taking differently: Place 1 drop into the right eye daily.) 5 g 11   magnesium oxide-pyridoxine (BEELITH) 362-20 MG TABS Take 1 tablet by mouth every evening.      metoprolol succinate (TOPROL-XL) 25 MG 24 hr tablet 1 tablet Orally Once a day 30 day(s) 90 tablet 1   Naltrexone-buPROPion HCl ER (CONTRAVE) 8-90 MG TB12 Take 2 tablets twice a day by oral route for 90 days. 360 tablet 0   Norethindrone Acetate-Ethinyl Estradiol (LARIN 1.5/30) 1.5-30 MG-MCG tablet Take 1 tablet by mouth daily. CONTINUOUS ACTIVE PILLS 84 tablet 0   potassium citrate (UROCIT-K) 10 MEQ (1080  MG) SR tablet Take 2 tablets (20 mEq total) by mouth 2 (two) times daily. 120 tablet 3   timolol (TIMOPTIC) 0.5 % ophthalmic solution Place 1 drop into the right eye daily. 5 mL 11   valACYclovir (VALTREX) 1000 MG tablet Take 1 tablet (1,000 mg total) by mouth daily. 90 tablet 11   albuterol (VENTOLIN HFA) 108 (90 Base) MCG/ACT inhaler Inhale 2 puffs into the lungs every 6 (six) hours as needed for wheezing or shortness of breath. 6.7 g 1   budesonide (PULMICORT FLEXHALER) 180 MCG/ACT inhaler Inhale 2 puffs into the lungs 2 (two) times daily to prevent cough or  wheeze. Rinse, gargle, spit after use. 3 each 5   EPINEPHrine 0.3 mg/0.3 mL IJ SOAJ injection Inject 0.3 mg into the muscle as needed for anaphylaxis. 2 each 1   montelukast (SINGULAIR) 10 MG tablet Take 1 tablet (10 mg total) by mouth at bedtime. 30 tablet 5   No current facility-administered medications for this visit.     Known medication allergies: Allergies  Allergen Reactions   Bee Venom Anaphylaxis    And wasp venom   Other Anaphylaxis   Pneumococcal Vaccines Anaphylaxis   Atorvastatin     Fatigue, Brain Fog, Muscle Pain   Mixed Vespid Venom    Rosuvastatin     Fatigue, Brain Fog, Muscle Pain   Wasp Venom      Physical examination: Blood pressure (!) 150/96, pulse 87, temperature 98.3 F (36.8 C), height 5' 4"$  (1.626 m), weight 164 lb 14.4 oz (74.8 kg), SpO2 100 %.  General: Alert, interactive, in no acute distress. HEENT: PERRLA, TMs pearly gray, turbinates non-edematous without discharge, post-pharynx non erythematous. Neck: Supple without lymphadenopathy. Lungs: Clear to auscultation without wheezing, rhonchi or rales. {no increased work of breathing. CV: Normal S1, S2 without murmurs. Abdomen: Nondistended, nontender. Skin: Warm and dry, without lesions or rashes. Extremities:  No clubbing, cyanosis or edema. Neuro:   Grossly intact.  Diagnositics/Labs: None today   Assessment and plan:   Asthma - doing well - Action plan (in case of respiratory illness or flare) use:  Pulmicort 180-  2-3 puffs twice a day for 1-2 weeks or until symptoms have resolved - Advise may be good idea to take Pulmicort 2 puffs twice a day prior to stopping Imuran if that decision in made and continue for about a week or so after to ensure stabilization of the airway - Continue Montelukast 10 mg once a day for a coughing or wheezing - Ventolin 2 puffs every 4-6 hours if needed for wheezing, coughing, shortness of breath or chest tightness.  Monitor frequency of use.  Asthma control  goals:  Full participation in all desired activities (may need albuterol before activity) Albuterol use two time or less a week on average (not counting use with activity) Cough interfering with sleep two time or less a month Oral steroids no more than once a year No hospitalizations  Allergic rhinitis   - Zyrtec 10 mg once a day for runny nose  - Rhinocort 1-2 spray per nostril twice a day for stuffy nose  - Montelukast as above  - Continue allergen immunotherapy per protocol  - Advise once she is well into maintenance (likely this winter/sprin 2025) will be able to reduce medications to as needed use and see how she does  Venom allergy  - continue venom injections at maintenance every 12 weeks  - If you have an insect sting take Benadryl 50 mg every 4 hours  and if you have life-threatening symptoms inject him with EpiPen 0.3 mg  Elevated BP  - continue to monitor at home  - discuss with PCP regarding medication management  Follow-up yearly or sooner if needed  I appreciate the opportunity to take part in Graciemae's care. Please do not hesitate to contact me with questions.  Sincerely,   Prudy Feeler, MD Allergy/Immunology [facility]

## 2022-12-06 ENCOUNTER — Other Ambulatory Visit: Payer: Self-pay

## 2022-12-10 ENCOUNTER — Other Ambulatory Visit: Payer: Self-pay

## 2022-12-11 ENCOUNTER — Other Ambulatory Visit: Payer: Self-pay

## 2022-12-11 MED ORDER — WEGOVY 0.25 MG/0.5ML ~~LOC~~ SOAJ
0.5000 mL | SUBCUTANEOUS | 0 refills | Status: DC
  Filled 2022-12-11 – 2022-12-16 (×4): qty 2, 28d supply, fill #0

## 2022-12-12 ENCOUNTER — Other Ambulatory Visit: Payer: Self-pay

## 2022-12-16 ENCOUNTER — Other Ambulatory Visit (HOSPITAL_BASED_OUTPATIENT_CLINIC_OR_DEPARTMENT_OTHER): Payer: Self-pay

## 2022-12-16 ENCOUNTER — Ambulatory Visit: Payer: 59 | Admitting: Family Medicine

## 2022-12-16 ENCOUNTER — Encounter: Payer: Self-pay | Admitting: Family Medicine

## 2022-12-16 ENCOUNTER — Ambulatory Visit (INDEPENDENT_AMBULATORY_CARE_PROVIDER_SITE_OTHER): Payer: 59 | Admitting: *Deleted

## 2022-12-16 ENCOUNTER — Other Ambulatory Visit: Payer: Self-pay

## 2022-12-16 VITALS — BP 138/98 | HR 84 | Temp 98.7°F | Resp 18 | Ht 64.0 in | Wt 163.2 lb

## 2022-12-16 DIAGNOSIS — H0102A Squamous blepharitis right eye, upper and lower eyelids: Secondary | ICD-10-CM | POA: Diagnosis not present

## 2022-12-16 DIAGNOSIS — L570 Actinic keratosis: Secondary | ICD-10-CM

## 2022-12-16 DIAGNOSIS — H16301 Unspecified interstitial keratitis, right eye: Secondary | ICD-10-CM | POA: Diagnosis not present

## 2022-12-16 DIAGNOSIS — H401112 Primary open-angle glaucoma, right eye, moderate stage: Secondary | ICD-10-CM | POA: Diagnosis not present

## 2022-12-16 DIAGNOSIS — H04123 Dry eye syndrome of bilateral lacrimal glands: Secondary | ICD-10-CM | POA: Diagnosis not present

## 2022-12-16 DIAGNOSIS — H17821 Peripheral opacity of cornea, right eye: Secondary | ICD-10-CM | POA: Diagnosis not present

## 2022-12-16 DIAGNOSIS — I10 Essential (primary) hypertension: Secondary | ICD-10-CM

## 2022-12-16 DIAGNOSIS — J309 Allergic rhinitis, unspecified: Secondary | ICD-10-CM | POA: Diagnosis not present

## 2022-12-16 DIAGNOSIS — H25011 Cortical age-related cataract, right eye: Secondary | ICD-10-CM | POA: Diagnosis not present

## 2022-12-16 DIAGNOSIS — H0102B Squamous blepharitis left eye, upper and lower eyelids: Secondary | ICD-10-CM | POA: Diagnosis not present

## 2022-12-16 LAB — COMPREHENSIVE METABOLIC PANEL
ALT: 15 U/L (ref 0–35)
AST: 19 U/L (ref 0–37)
Albumin: 4.5 g/dL (ref 3.5–5.2)
Alkaline Phosphatase: 45 U/L (ref 39–117)
BUN: 19 mg/dL (ref 6–23)
CO2: 25 mEq/L (ref 19–32)
Calcium: 10.2 mg/dL (ref 8.4–10.5)
Chloride: 104 mEq/L (ref 96–112)
Creatinine, Ser: 0.72 mg/dL (ref 0.40–1.20)
GFR: 103.84 mL/min (ref >60.00)
Glucose, Bld: 81 mg/dL (ref 70–99)
Potassium: 4.4 mEq/L (ref 3.5–5.1)
Sodium: 140 mEq/L (ref 135–145)
Total Bilirubin: 0.8 mg/dL (ref 0.2–1.2)
Total Protein: 7.5 g/dL (ref 6.0–8.3)

## 2022-12-16 LAB — CBC WITH DIFFERENTIAL/PLATELET
Basophils Absolute: 0.1 10*3/uL (ref 0.0–0.1)
Basophils Relative: 1.1 % (ref 0.0–3.0)
Eosinophils Absolute: 0.1 10*3/uL (ref 0.0–0.7)
Eosinophils Relative: 1.3 % (ref 0.0–5.0)
HCT: 43.8 % (ref 36.0–46.0)
Hemoglobin: 15.6 g/dL — ABNORMAL HIGH (ref 12.0–15.0)
Lymphocytes Relative: 17.6 % (ref 12.0–46.0)
Lymphs Abs: 1.2 10*3/uL (ref 0.7–4.0)
MCHC: 35.5 g/dL (ref 30.0–36.0)
MCV: 109.8 fl — ABNORMAL HIGH (ref 78.0–100.0)
Monocytes Absolute: 0.5 10*3/uL (ref 0.1–1.0)
Monocytes Relative: 7.4 % (ref 3.0–12.0)
Neutro Abs: 4.8 10*3/uL (ref 1.4–7.7)
Neutrophils Relative %: 72.6 % (ref 43.0–77.0)
Platelets: 407 10*3/uL — ABNORMAL HIGH (ref 150.0–400.0)
RBC: 3.99 Mil/uL (ref 3.87–5.11)
RDW: 12.5 % (ref 11.5–15.5)
WBC: 6.6 10*3/uL (ref 4.0–10.5)

## 2022-12-16 LAB — LIPID PANEL
Cholesterol: 150 mg/dL (ref 0–200)
HDL: 45.3 mg/dL (ref >39.00)
NonHDL: 104.44
Total CHOL/HDL Ratio: 3
Triglycerides: 274 mg/dL — ABNORMAL HIGH (ref 0.0–149.0)
VLDL: 54.8 mg/dL — ABNORMAL HIGH (ref 0.0–40.0)

## 2022-12-16 LAB — TSH: TSH: 0.93 u[IU]/mL (ref 0.35–5.50)

## 2022-12-16 LAB — LDL CHOLESTEROL, DIRECT: Direct LDL: 80 mg/dL

## 2022-12-16 MED ORDER — METOPROLOL SUCCINATE ER 50 MG PO TB24
50.0000 mg | ORAL_TABLET | Freq: Every day | ORAL | 3 refills | Status: DC
  Filled 2022-12-16: qty 90, 90d supply, fill #0
  Filled 2023-05-26: qty 90, 90d supply, fill #1
  Filled 2023-08-24: qty 90, 90d supply, fill #2

## 2022-12-16 MED ORDER — FLUOROURACIL 5 % EX CREA
TOPICAL_CREAM | Freq: Two times a day (BID) | CUTANEOUS | 0 refills | Status: DC
  Filled 2022-12-16: qty 40, 30d supply, fill #0

## 2022-12-16 NOTE — Assessment & Plan Note (Signed)
Poorly controlled will alter medications, encouraged DASH diet, minimize caffeine and obtain adequate sleep. Report concerning symptoms and follow up as directed and as needed 

## 2022-12-16 NOTE — Progress Notes (Signed)
Subjective:   By signing my name below, I, Marlana Latus, attest that this documentation has been prepared under the direction and in the presence of Ann Held, DO 12/16/22   Patient ID: Rachel Hooper, female    DOB: December 05, 1980, 42 y.o.   MRN: [id number]  Chief Complaint  Patient presents with   Hypertension    Pt states checking blood pressures at home. Pt states noticing over the last 2 weeks and states having ear aches, headaches, dizziness. Pt went to allergist and bp were high   Follow-up    HPI Patient is in today for an office visit.   She was previously prescribed 100 mg of Metoprolol about 2 years ago and has since gradually decreased her dose as she lost weight. She reports prior readings of 180/120 and 160/100 and she went back to Metoprolol 50 mg daily for a week. She is now compliant with 25 mg daily of Metoprolol.   She reports that she was compliant with Phentermine for about 3-4 months. She was tolerating it well and any began gaining weight back when she stopped taking it. She reports a previous weight at 245 lbs and has a weight goal of 145 lbs. She exercises regularly and notes her heart rate tends to be in the 60s bpm. She states an interest in trying [facility].   She has tried vegetarian and vegan diets. She felt fine while on the diet but noticed weight gain when she began eating meat again. She notes that she tends to struggle with carbs when off the diet.   She reports a thick dry patch on her left cheek. She has not been able to follow up with a dermatologist due to high patient volume. She reports she used to regularly tan in tanning bed until she was 42 years old and also is regularly in the sun.   Past Medical History:  Diagnosis Date   Asthma    Endometriosis    GERD (gastroesophageal reflux disease)    History of kidney stones    Hydrosalpinx    left   Hyperlipidemia    Repatha   Hypertension    PAI-1 4G/4G genotype    PCOS (polycystic  ovarian syndrome)    PONV (postoperative nausea and vomiting)    severe   Seasonal allergies    Shingles     Past Surgical History:  Procedure Laterality Date   ANTERIOR CRUCIATE LIGAMENT REPAIR Right 1995   CHROMOPERTUBATION N/A 05/03/2015   Procedure: CHROMOPERTUBATION;  Surgeon: Governor Specking, MD;  Location: [place];  Service: Gynecology;  Laterality: N/A;   EXTRACORPOREAL SHOCK WAVE LITHOTRIPSY  2008   LAPAROSCOPY N/A 03/28/2015   Procedure: LAPAROSCOPY DIAGNOSTIC;  Surgeon: Brien Few, MD;  Location: [facility];  Service: Gynecology;  Laterality: N/A;   LAPAROSCOPY N/A 05/03/2015   Procedure: LAPAROSCOPY, EXCISION ENDOMETRIOSIS, ENTERO LYSIS, WITH ENDOMETRIAL BIOPSY;  Surgeon: Governor Specking, MD;  Location: [place];  Service: Gynecology;  Laterality: N/A;   NASAL SEPTOPLASTY W/ TURBINOPLASTY Bilateral 06/20/2017   Procedure: NASAL SEPTOPLASTY WITH BILATERAL TURBINATE REDUCTION;  Surgeon: Melida Quitter, MD;  Location: [place];  Service: ENT;  Laterality: Bilateral;   ROBOTIC ASSISTED TOTAL HYSTERECTOMY N/A 08/16/2021   Procedure: XI ROBOTIC ASSISTED TOTAL HYSTERECTOMY; RIGHT SALPINGO-OOPHERECTOMY;  Surgeon: [name], MD;  Location: [facility];  Service: Gynecology;  Laterality: N/A;   UNILATERAL SALPINGECTOMY Left 05/03/2015   Procedure: LEFT SALPINGECTOMY;  Surgeon: Governor Specking, MD;  Location: [place];  Service: Gynecology;  Laterality: Left;   WISDOM TOOTH EXTRACTION      Family History  Problem Relation Age of Onset   Allergic rhinitis Mother    Asthma Mother    Eczema Mother    Stroke Father    COPD Maternal Grandmother    Colon cancer Maternal Grandmother    Eczema Maternal Grandfather    Dementia Maternal Grandfather    COPD Maternal Grandfather    Stroke Paternal Grandmother    Breast cancer Paternal Grandmother    Angioedema Neg Hx    Immunodeficiency Neg Hx    Urticaria Neg Hx     Social History    Socioeconomic History   Marital status: Married    Spouse name: Not on file   Number of children: 1   Years of education: Not on file   Highest education level: Not on file  Occupational History   Occupation: Designer, jewellery    Employer: [employer]  Tobacco Use   Smoking status: Never   Smokeless tobacco: Never  Vaping Use   Vaping Use: Never used  Substance and Sexual Activity   Alcohol use: No   Drug use: No   Sexual activity: Yes    Partners: Male    Birth control/protection: Pill  Other Topics Concern   Not on file  Social History Narrative   Exercise daily   Social Determinants of Health   Financial Resource Strain: Not on file  Food Insecurity: Not on file  Transportation Needs: Not on file  Physical Activity: Not on file  Stress: Not on file  Social Connections: Not on file  Intimate Partner Violence: Not on file    Outpatient Medications Prior to Visit  Medication Sig Dispense Refill   albuterol (VENTOLIN HFA) 108 (90 Base) MCG/ACT inhaler Inhale 2 puffs into the lungs every 6 (six) hours as needed for wheezing or shortness of breath. 6.7 g 1   azaTHIOprine (IMURAN) 50 MG tablet Take 1 tablet (50 mg total) by mouth 3 (three) times daily. 270 tablet 2   budesonide (PULMICORT FLEXHALER) 180 MCG/ACT inhaler Inhale 2 puffs into the lungs 2 (two) times daily to prevent cough or wheeze. Rinse, gargle, spit after use. 3 each 5   budesonide (RHINOCORT ALLERGY) 32 MCG/ACT nasal spray ONE SPRAY EACH NOSTRIL TWICE A DAY FOR NASAL CONGESTION OR DRAINAGE. (Patient taking differently: Place 1 spray into both nostrils 2 (two) times daily.) 8.6 g 5   cetirizine (ZYRTEC) 10 MG tablet Take 1 tablet (10 mg total) by mouth at bedtime. 30 tablet 5   EPINEPHrine 0.3 mg/0.3 mL IJ SOAJ injection Inject 0.3 mg into the muscle as needed for anaphylaxis. 2 each 1   Evolocumab (REPATHA SURECLICK) XX123456 MG/ML SOAJ Inject 1 Dose into the skin every 14 (fourteen) days. 6 mL 3    Loteprednol Etabonate (LOTEMAX SM) 0.38 % GEL Place 1 drop into the right eye in the morning and at bedtime. (Patient taking differently: Place 1 drop into the right eye daily.) 5 g 11   magnesium oxide-pyridoxine (BEELITH) 362-20 MG TABS Take 1 tablet by mouth every evening.      montelukast (SINGULAIR) 10 MG tablet Take 1 tablet (10 mg total) by mouth at bedtime. 30 tablet 5   Naltrexone-buPROPion HCl ER (CONTRAVE) 8-90 MG TB12 Take 2 tablets twice a day by oral route for 90 days. 360 tablet 0   Norethindrone Acetate-Ethinyl Estradiol (LARIN 1.5/30) 1.5-30 MG-MCG tablet Take 1 tablet by mouth  daily. CONTINUOUS ACTIVE PILLS 84 tablet 0   potassium citrate (UROCIT-K) 10 MEQ (1080 MG) SR tablet Take 2 tablets (20 mEq total) by mouth 2 (two) times daily. 120 tablet 3   Semaglutide-Weight Management (WEGOVY) 0.25 MG/0.5ML SOAJ Inject 0.25 mg into the skin once a week. 2 mL 0   timolol (TIMOPTIC) 0.5 % ophthalmic solution Place 1 drop into the right eye daily. 5 mL 11   valACYclovir (VALTREX) 1000 MG tablet Take 1 tablet (1,000 mg total) by mouth daily. 90 tablet 11   metoprolol succinate (TOPROL-XL) 25 MG 24 hr tablet 1 tablet Orally Once a day 30 day(s) 90 tablet 1   No facility-administered medications prior to visit.    Allergies  Allergen Reactions   Bee Venom Anaphylaxis    And wasp venom   Other Anaphylaxis   Pneumococcal Vaccines Anaphylaxis   Atorvastatin     Fatigue, Brain Fog, Muscle Pain   Mixed Vespid Venom    Rosuvastatin     Fatigue, Brain Fog, Muscle Pain   Wasp Venom     Review of Systems  Constitutional:  Positive for weight loss. Negative for fever and malaise/fatigue.  HENT:  Negative for congestion.   Eyes:  Negative for blurred vision.  Respiratory:  Negative for shortness of breath.   Cardiovascular:  Negative for chest pain, palpitations and leg swelling.  Gastrointestinal:  Negative for abdominal pain, blood in stool and nausea.  Genitourinary:  Negative for  dysuria and frequency.  Musculoskeletal:  Negative for falls.  Skin:  Negative for rash.       (+) Dry patch on left cheek  Neurological:  Negative for dizziness, loss of consciousness and headaches.  Endo/Heme/Allergies:  Negative for environmental allergies.  Psychiatric/Behavioral:  Negative for depression. The patient is not nervous/anxious.        Objective:    Physical Exam Vitals and nursing note reviewed.  Constitutional:      General: She is not in acute distress.    Appearance: Normal appearance.  HENT:     Head: Normocephalic and atraumatic.     Right Ear: External ear normal.     Left Ear: External ear normal.  Eyes:     Extraocular Movements: Extraocular movements intact.     Pupils: Pupils are equal, round, and reactive to light.  Cardiovascular:     Rate and Rhythm: Normal rate and regular rhythm.     Pulses: Normal pulses.     Heart sounds: Normal heart sounds. No murmur heard.    No gallop.  Pulmonary:     Effort: Pulmonary effort is normal. No respiratory distress.     Breath sounds: Normal breath sounds. No wheezing.  Skin:    General: Skin is warm and dry.  Neurological:     General: No focal deficit present.     Mental Status: She is alert and oriented to person, place, and time.  Psychiatric:        Judgment: Judgment normal.     BP (!) 138/98 (BP Location: Left Arm, Patient Position: Sitting, Cuff Size: Normal)   Pulse 84   Temp 98.7 F (37.1 C) (Oral)   Resp 18   Ht '5\' 4"'$  (1.626 m)   Wt 163 lb 3.2 oz (74 kg)   SpO2 97%   BMI 28.01 kg/m  Wt Readings from Last 3 Encounters:  12/16/22 163 lb 3.2 oz (74 kg)  12/04/22 164 lb 14.4 oz (74.8 kg)  05/27/22 170 lb (77.1 kg)  Assessment & Plan:  AK (actinic keratosis) -     Fluorouracil; Apply topically 2 (two) times daily.  Dispense: 40 g; Refill: 0  Primary hypertension Assessment & Plan: Poorly controlled will alter medications, encouraged DASH diet, minimize caffeine and obtain  adequate sleep. Report concerning symptoms and follow up as directed and as needed   Orders: -     Metoprolol Succinate ER; Take 1 tablet (50 mg total) by mouth daily. Take with or immediately following a meal.  Dispense: 90 tablet; Refill: 3 -     CBC with Differential/Platelet -     Comprehensive metabolic panel -     Lipid panel -     TSH     I,Rachel Rivera,acting as a scribe for Ann Held, DO.,have documented all relevant documentation on the behalf of Ann Held, DO,as directed by  Ann Held, DO while in the presence of Ann Held, DO.   I, Ann Held, DO, personally preformed the services described in this documentation.  All medical record entries made by the scribe were at my direction and in my presence.  I have reviewed the chart and discharge instructions (if applicable) and agree that the record reflects my personal performance and is accurate and complete. 12/16/22   Ann Held, DO

## 2022-12-22 ENCOUNTER — Encounter: Payer: Self-pay | Admitting: Family Medicine

## 2022-12-23 ENCOUNTER — Ambulatory Visit (INDEPENDENT_AMBULATORY_CARE_PROVIDER_SITE_OTHER): Payer: 59

## 2022-12-23 ENCOUNTER — Other Ambulatory Visit: Payer: Self-pay

## 2022-12-23 DIAGNOSIS — J309 Allergic rhinitis, unspecified: Secondary | ICD-10-CM | POA: Diagnosis not present

## 2022-12-23 MED ORDER — LOSARTAN POTASSIUM 50 MG PO TABS
50.0000 mg | ORAL_TABLET | Freq: Every day | ORAL | 2 refills | Status: DC
  Filled 2022-12-23: qty 30, 30d supply, fill #0
  Filled 2023-01-14: qty 60, 60d supply, fill #1

## 2022-12-23 NOTE — Telephone Encounter (Signed)
Please see previous message

## 2022-12-23 NOTE — Telephone Encounter (Signed)
Pt called the office. Message relayed. Rx sent

## 2022-12-31 DIAGNOSIS — J3089 Other allergic rhinitis: Secondary | ICD-10-CM | POA: Diagnosis not present

## 2022-12-31 NOTE — Progress Notes (Signed)
VIAL EXP 12-31-23

## 2023-01-06 ENCOUNTER — Ambulatory Visit (INDEPENDENT_AMBULATORY_CARE_PROVIDER_SITE_OTHER): Payer: 59 | Admitting: *Deleted

## 2023-01-06 ENCOUNTER — Other Ambulatory Visit: Payer: Self-pay

## 2023-01-06 DIAGNOSIS — Z01419 Encounter for gynecological examination (general) (routine) without abnormal findings: Secondary | ICD-10-CM | POA: Diagnosis not present

## 2023-01-06 DIAGNOSIS — Z1231 Encounter for screening mammogram for malignant neoplasm of breast: Secondary | ICD-10-CM | POA: Diagnosis not present

## 2023-01-06 DIAGNOSIS — J309 Allergic rhinitis, unspecified: Secondary | ICD-10-CM

## 2023-01-06 MED ORDER — NORETHINDRONE ACET-ETHINYL EST 1.5-30 MG-MCG PO TABS
ORAL_TABLET | ORAL | 3 refills | Status: DC
  Filled 2023-01-06 – 2023-02-16 (×2): qty 84, 84d supply, fill #0
  Filled 2023-05-26: qty 84, 84d supply, fill #1
  Filled 2023-08-21: qty 84, 84d supply, fill #2
  Filled 2023-11-27: qty 84, 84d supply, fill #3

## 2023-01-07 ENCOUNTER — Other Ambulatory Visit: Payer: Self-pay

## 2023-01-07 DIAGNOSIS — T380X5A Adverse effect of glucocorticoids and synthetic analogues, initial encounter: Secondary | ICD-10-CM | POA: Diagnosis not present

## 2023-01-07 DIAGNOSIS — B0052 Herpesviral keratitis: Secondary | ICD-10-CM | POA: Diagnosis not present

## 2023-01-07 DIAGNOSIS — H4061X0 Glaucoma secondary to drugs, right eye, stage unspecified: Secondary | ICD-10-CM | POA: Diagnosis not present

## 2023-01-07 DIAGNOSIS — H209 Unspecified iridocyclitis: Secondary | ICD-10-CM | POA: Diagnosis not present

## 2023-01-07 MED ORDER — AZATHIOPRINE 50 MG PO TABS
50.0000 mg | ORAL_TABLET | Freq: Three times a day (TID) | ORAL | 2 refills | Status: DC
  Filled 2023-01-07: qty 270, 90d supply, fill #0

## 2023-01-07 MED ORDER — LOTEMAX SM 0.38 % OP GEL
1.0000 [drp] | Freq: Two times a day (BID) | OPHTHALMIC | 11 refills | Status: AC
  Filled 2023-01-07: qty 5, 25d supply, fill #0
  Filled 2023-10-21: qty 5, 25d supply, fill #1

## 2023-01-07 MED ORDER — TIMOLOL MALEATE 0.5 % OP SOLN
1.0000 [drp] | Freq: Every day | OPHTHALMIC | 11 refills | Status: DC
  Filled 2023-01-07: qty 5, 90d supply, fill #0

## 2023-01-07 MED ORDER — VALACYCLOVIR HCL 1 G PO TABS
1000.0000 mg | ORAL_TABLET | Freq: Every day | ORAL | 11 refills | Status: DC
  Filled 2023-01-07 – 2023-10-06 (×2): qty 90, 90d supply, fill #0

## 2023-01-09 ENCOUNTER — Telehealth: Payer: Self-pay | Admitting: Internal Medicine

## 2023-01-09 ENCOUNTER — Other Ambulatory Visit: Payer: Self-pay

## 2023-01-09 NOTE — Telephone Encounter (Signed)
Pt c/o medication issue:  1. Name of Medication: Evolocumab (REPATHA SURECLICK) [id number] MG/ML SOAJ    2. How are you currently taking this medication (dosage and times per day)?   3. Are you having a reaction (difficulty breathing--STAT)?   4. What is your medication issue? Patient is calling stating that P.A. has expired on this medication and she would like another sent in. She states she would also like a call back to discuss further how the Prior Auth works and if fax was received from pharmacy. Please advise.

## 2023-01-10 ENCOUNTER — Encounter (HOSPITAL_BASED_OUTPATIENT_CLINIC_OR_DEPARTMENT_OTHER): Payer: Self-pay | Admitting: Internal Medicine

## 2023-01-10 ENCOUNTER — Other Ambulatory Visit: Payer: Self-pay

## 2023-01-10 ENCOUNTER — Telehealth: Payer: Self-pay

## 2023-01-10 NOTE — Telephone Encounter (Signed)
Pharmacy Patient Advocate Encounter   Received notification from RN that prior authorization for REPATHA 140MG /ML is needing a f/u     PA submitted on 3.18.24 via RN to (ins) [employer] via CoverMyMeds  Key  # [id number]  Status is pending

## 2023-01-10 NOTE — Telephone Encounter (Signed)
Left voicemail for patient to return call to office. 

## 2023-01-10 NOTE — Telephone Encounter (Signed)
Pt calling back for an update on PA, Please advise

## 2023-01-10 NOTE — Telephone Encounter (Signed)
P/a is currently. Will update with determination as it comes through.  [id number]

## 2023-01-10 NOTE — Telephone Encounter (Signed)
Patient called to get an update on her medication Repatha and pre-authorization. Says that she really needs her medication. She gets a 3 month supply

## 2023-01-13 ENCOUNTER — Other Ambulatory Visit: Payer: Self-pay

## 2023-01-13 NOTE — Telephone Encounter (Signed)
LVM that medication she requested is still pending with her insurance.  We will let her know once it has been approved.

## 2023-01-13 NOTE — Telephone Encounter (Signed)
Medication approved. Document under media. Patient updated in [place]

## 2023-01-14 ENCOUNTER — Other Ambulatory Visit: Payer: Self-pay

## 2023-01-16 NOTE — Telephone Encounter (Signed)
Please see MyChart encounter.

## 2023-01-17 ENCOUNTER — Other Ambulatory Visit: Payer: Self-pay

## 2023-01-20 ENCOUNTER — Ambulatory Visit (INDEPENDENT_AMBULATORY_CARE_PROVIDER_SITE_OTHER): Payer: 59 | Admitting: *Deleted

## 2023-01-20 DIAGNOSIS — J309 Allergic rhinitis, unspecified: Secondary | ICD-10-CM

## 2023-02-03 ENCOUNTER — Ambulatory Visit (INDEPENDENT_AMBULATORY_CARE_PROVIDER_SITE_OTHER): Payer: 59 | Admitting: *Deleted

## 2023-02-03 DIAGNOSIS — T63441D Toxic effect of venom of bees, accidental (unintentional), subsequent encounter: Secondary | ICD-10-CM | POA: Diagnosis not present

## 2023-02-12 DIAGNOSIS — H4061X0 Glaucoma secondary to drugs, right eye, stage unspecified: Secondary | ICD-10-CM | POA: Diagnosis not present

## 2023-02-12 DIAGNOSIS — T380X5A Adverse effect of glucocorticoids and synthetic analogues, initial encounter: Secondary | ICD-10-CM | POA: Diagnosis not present

## 2023-02-17 ENCOUNTER — Other Ambulatory Visit: Payer: Self-pay

## 2023-02-17 ENCOUNTER — Ambulatory Visit (INDEPENDENT_AMBULATORY_CARE_PROVIDER_SITE_OTHER): Payer: 59

## 2023-02-17 DIAGNOSIS — J309 Allergic rhinitis, unspecified: Secondary | ICD-10-CM | POA: Diagnosis not present

## 2023-02-19 ENCOUNTER — Other Ambulatory Visit: Payer: Self-pay

## 2023-02-19 ENCOUNTER — Other Ambulatory Visit (HOSPITAL_COMMUNITY): Payer: Self-pay

## 2023-02-19 DIAGNOSIS — H10023 Other mucopurulent conjunctivitis, bilateral: Secondary | ICD-10-CM | POA: Diagnosis not present

## 2023-02-19 MED ORDER — POLYMYXIN B-TRIMETHOPRIM 10000-0.1 UNIT/ML-% OP SOLN
1.0000 [drp] | Freq: Four times a day (QID) | OPHTHALMIC | 0 refills | Status: DC
  Filled 2023-02-19: qty 10, 25d supply, fill #0

## 2023-03-05 ENCOUNTER — Ambulatory Visit (INDEPENDENT_AMBULATORY_CARE_PROVIDER_SITE_OTHER): Payer: 59 | Admitting: *Deleted

## 2023-03-05 DIAGNOSIS — J309 Allergic rhinitis, unspecified: Secondary | ICD-10-CM

## 2023-03-06 ENCOUNTER — Other Ambulatory Visit: Payer: Self-pay

## 2023-03-06 MED ORDER — LOSARTAN POTASSIUM 50 MG PO TABS
50.0000 mg | ORAL_TABLET | Freq: Every day | ORAL | 2 refills | Status: DC
  Filled 2023-03-06: qty 30, 30d supply, fill #0
  Filled 2023-04-14: qty 60, 60d supply, fill #1

## 2023-03-06 NOTE — Addendum Note (Signed)
Addended by: Roxanne Gates on: 03/06/2023 08:54 AM   Modules accepted: Orders

## 2023-03-11 ENCOUNTER — Ambulatory Visit (INDEPENDENT_AMBULATORY_CARE_PROVIDER_SITE_OTHER): Payer: 59

## 2023-03-11 DIAGNOSIS — J309 Allergic rhinitis, unspecified: Secondary | ICD-10-CM | POA: Diagnosis not present

## 2023-03-17 ENCOUNTER — Other Ambulatory Visit: Payer: Self-pay

## 2023-03-18 ENCOUNTER — Other Ambulatory Visit: Payer: Self-pay

## 2023-03-18 ENCOUNTER — Ambulatory Visit (INDEPENDENT_AMBULATORY_CARE_PROVIDER_SITE_OTHER): Payer: 59 | Admitting: *Deleted

## 2023-03-18 DIAGNOSIS — J309 Allergic rhinitis, unspecified: Secondary | ICD-10-CM | POA: Diagnosis not present

## 2023-03-31 ENCOUNTER — Ambulatory Visit (INDEPENDENT_AMBULATORY_CARE_PROVIDER_SITE_OTHER): Payer: 59 | Admitting: *Deleted

## 2023-03-31 DIAGNOSIS — J309 Allergic rhinitis, unspecified: Secondary | ICD-10-CM | POA: Diagnosis not present

## 2023-04-14 ENCOUNTER — Other Ambulatory Visit: Payer: Self-pay | Admitting: Internal Medicine

## 2023-04-14 ENCOUNTER — Other Ambulatory Visit: Payer: Self-pay

## 2023-04-15 ENCOUNTER — Ambulatory Visit (INDEPENDENT_AMBULATORY_CARE_PROVIDER_SITE_OTHER): Payer: 59 | Admitting: *Deleted

## 2023-04-15 ENCOUNTER — Other Ambulatory Visit: Payer: Self-pay

## 2023-04-15 ENCOUNTER — Telehealth: Payer: Self-pay | Admitting: Internal Medicine

## 2023-04-15 DIAGNOSIS — J309 Allergic rhinitis, unspecified: Secondary | ICD-10-CM

## 2023-04-15 MED FILL — Evolocumab Subcutaneous Soln Auto-Injector 140 MG/ML: SUBCUTANEOUS | 84 days supply | Qty: 6 | Fill #0 | Status: AC

## 2023-04-15 NOTE — Telephone Encounter (Signed)
*  STAT* If patient is at the pharmacy, call can be transferred to refill team.   1. Which medications need to be refilled? (please list name of each medication and dose if known)   Evolocumab (REPATHA SURECLICK) 140 MG/ML SOAJ    2. Which pharmacy/location (including street and city if local pharmacy) is medication to be sent to?  [facility]      3. Do they need a 30 day or 90 day supply? 90 day   Pt has upcoming appt on 08/28/2023

## 2023-04-16 ENCOUNTER — Other Ambulatory Visit: Payer: Self-pay

## 2023-04-16 NOTE — Telephone Encounter (Signed)
Refill was sent in yesterday

## 2023-05-02 ENCOUNTER — Other Ambulatory Visit: Payer: Self-pay

## 2023-05-04 ENCOUNTER — Other Ambulatory Visit: Payer: Self-pay

## 2023-05-05 ENCOUNTER — Ambulatory Visit: Payer: 59

## 2023-05-05 ENCOUNTER — Other Ambulatory Visit: Payer: Self-pay

## 2023-05-05 MED ORDER — POTASSIUM CITRATE ER 10 MEQ (1080 MG) PO TBCR
20.0000 meq | EXTENDED_RELEASE_TABLET | Freq: Two times a day (BID) | ORAL | 3 refills | Status: DC
Start: 2023-05-05 — End: 2023-08-11
  Filled 2023-05-05 – 2023-05-09 (×2): qty 120, 30d supply, fill #0
  Filled 2023-05-09: qty 360, 90d supply, fill #0

## 2023-05-08 ENCOUNTER — Ambulatory Visit: Payer: 59

## 2023-05-08 ENCOUNTER — Other Ambulatory Visit: Payer: Self-pay

## 2023-05-09 ENCOUNTER — Other Ambulatory Visit: Payer: Self-pay

## 2023-05-09 ENCOUNTER — Ambulatory Visit (INDEPENDENT_AMBULATORY_CARE_PROVIDER_SITE_OTHER): Payer: 59 | Admitting: *Deleted

## 2023-05-09 ENCOUNTER — Ambulatory Visit: Payer: 59

## 2023-05-09 DIAGNOSIS — T63441D Toxic effect of venom of bees, accidental (unintentional), subsequent encounter: Secondary | ICD-10-CM

## 2023-05-12 ENCOUNTER — Other Ambulatory Visit: Payer: Self-pay

## 2023-05-13 ENCOUNTER — Other Ambulatory Visit: Payer: Self-pay

## 2023-05-19 ENCOUNTER — Ambulatory Visit (INDEPENDENT_AMBULATORY_CARE_PROVIDER_SITE_OTHER): Payer: 59

## 2023-05-19 DIAGNOSIS — J309 Allergic rhinitis, unspecified: Secondary | ICD-10-CM

## 2023-05-21 ENCOUNTER — Encounter (INDEPENDENT_AMBULATORY_CARE_PROVIDER_SITE_OTHER): Payer: Self-pay

## 2023-05-26 ENCOUNTER — Other Ambulatory Visit: Payer: Self-pay

## 2023-05-27 ENCOUNTER — Other Ambulatory Visit: Payer: Self-pay | Admitting: Family Medicine

## 2023-05-27 ENCOUNTER — Other Ambulatory Visit: Payer: Self-pay

## 2023-05-27 DIAGNOSIS — T380X5A Adverse effect of glucocorticoids and synthetic analogues, initial encounter: Secondary | ICD-10-CM | POA: Diagnosis not present

## 2023-05-27 DIAGNOSIS — H209 Unspecified iridocyclitis: Secondary | ICD-10-CM | POA: Diagnosis not present

## 2023-05-27 DIAGNOSIS — B0052 Herpesviral keratitis: Secondary | ICD-10-CM | POA: Diagnosis not present

## 2023-05-27 DIAGNOSIS — H4061X0 Glaucoma secondary to drugs, right eye, stage unspecified: Secondary | ICD-10-CM | POA: Diagnosis not present

## 2023-05-27 MED ORDER — TIMOLOL MALEATE 0.5 % OP SOLN
1.0000 [drp] | Freq: Every day | OPHTHALMIC | 11 refills | Status: DC
  Filled 2023-05-27: qty 5, 90d supply, fill #0

## 2023-05-27 MED ORDER — LOTEMAX SM 0.38 % OP GEL
1.0000 [drp] | Freq: Two times a day (BID) | OPHTHALMIC | 11 refills | Status: DC
Start: 2023-05-27 — End: 2023-12-22
  Filled 2023-05-27: qty 5, 25d supply, fill #0
  Filled 2023-08-11: qty 5, 25d supply, fill #1
  Filled 2023-10-21: qty 5, 25d supply, fill #2

## 2023-05-27 MED ORDER — AZATHIOPRINE 50 MG PO TABS
50.0000 mg | ORAL_TABLET | Freq: Three times a day (TID) | ORAL | 2 refills | Status: DC
  Filled 2023-05-27: qty 270, 90d supply, fill #0
  Filled 2023-08-11 (×2): qty 270, 90d supply, fill #1

## 2023-05-27 MED ORDER — VALACYCLOVIR HCL 1 G PO TABS
1000.0000 mg | ORAL_TABLET | Freq: Every day | ORAL | 11 refills | Status: DC
  Filled 2023-05-27: qty 90, 90d supply, fill #0
  Filled 2023-08-09: qty 90, 90d supply, fill #1

## 2023-05-27 MED ORDER — TIMOLOL MALEATE PF 0.5 % OP SOLN
1.0000 [drp] | Freq: Two times a day (BID) | OPHTHALMIC | 11 refills | Status: DC
  Filled 2023-05-27: qty 180, 90d supply, fill #0
  Filled 2023-08-11: qty 180, 90d supply, fill #1

## 2023-05-28 ENCOUNTER — Other Ambulatory Visit: Payer: Self-pay

## 2023-05-28 ENCOUNTER — Other Ambulatory Visit: Payer: Self-pay | Admitting: Allergy

## 2023-05-28 MED ORDER — MONTELUKAST SODIUM 10 MG PO TABS
10.0000 mg | ORAL_TABLET | Freq: Every day | ORAL | 5 refills | Status: DC
  Filled 2023-05-28: qty 90, 90d supply, fill #0
  Filled 2023-08-11 – 2023-08-18 (×5): qty 90, 90d supply, fill #1

## 2023-05-28 MED ORDER — LOSARTAN POTASSIUM 50 MG PO TABS
50.0000 mg | ORAL_TABLET | Freq: Every day | ORAL | 0 refills | Status: DC
  Filled 2023-05-28: qty 90, 90d supply, fill #0

## 2023-05-29 ENCOUNTER — Other Ambulatory Visit: Payer: Self-pay

## 2023-06-03 ENCOUNTER — Ambulatory Visit (INDEPENDENT_AMBULATORY_CARE_PROVIDER_SITE_OTHER): Payer: 59 | Admitting: *Deleted

## 2023-06-03 DIAGNOSIS — J309 Allergic rhinitis, unspecified: Secondary | ICD-10-CM | POA: Diagnosis not present

## 2023-06-04 ENCOUNTER — Other Ambulatory Visit: Payer: Self-pay

## 2023-06-05 DIAGNOSIS — J3089 Other allergic rhinitis: Secondary | ICD-10-CM | POA: Diagnosis not present

## 2023-06-05 NOTE — Progress Notes (Signed)
VIAL EXP 06-04-24

## 2023-06-13 ENCOUNTER — Telehealth: Payer: Self-pay | Admitting: Family Medicine

## 2023-06-13 NOTE — Telephone Encounter (Signed)
Pt called stating that she needed to get her physical done before the Sept 1st deadline for [employer]. Pt already has a 6 month follow up scheduled for 8.26.24. Pt asked if it would be possible to rework this to be a CPE so that her premium doesn't go up. Please Advise.

## 2023-06-13 NOTE — Telephone Encounter (Signed)
Pt called back to follow up on previous call since it's end of day. Advised pt that provider okayed the change. PT is very appreciative and wanted Dr. Laury Axon to know.

## 2023-06-16 ENCOUNTER — Ambulatory Visit (INDEPENDENT_AMBULATORY_CARE_PROVIDER_SITE_OTHER): Payer: 59

## 2023-06-16 ENCOUNTER — Other Ambulatory Visit: Payer: Self-pay

## 2023-06-16 ENCOUNTER — Ambulatory Visit (INDEPENDENT_AMBULATORY_CARE_PROVIDER_SITE_OTHER): Payer: 59 | Admitting: Family Medicine

## 2023-06-16 ENCOUNTER — Encounter: Payer: Self-pay | Admitting: Family Medicine

## 2023-06-16 VITALS — BP 110/80 | HR 91 | Temp 98.6°F | Resp 18 | Ht 64.0 in | Wt 168.2 lb

## 2023-06-16 DIAGNOSIS — E785 Hyperlipidemia, unspecified: Secondary | ICD-10-CM | POA: Diagnosis not present

## 2023-06-16 DIAGNOSIS — I10 Essential (primary) hypertension: Secondary | ICD-10-CM

## 2023-06-16 DIAGNOSIS — Z0000 Encounter for general adult medical examination without abnormal findings: Secondary | ICD-10-CM

## 2023-06-16 DIAGNOSIS — J309 Allergic rhinitis, unspecified: Secondary | ICD-10-CM

## 2023-06-16 LAB — CBC WITH DIFFERENTIAL/PLATELET
Basophils Absolute: 0 10*3/uL (ref 0.0–0.1)
Basophils Relative: 0.4 % (ref 0.0–3.0)
Eosinophils Absolute: 0.1 10*3/uL (ref 0.0–0.7)
Eosinophils Relative: 1.3 % (ref 0.0–5.0)
HCT: 44.8 % (ref 36.0–46.0)
Hemoglobin: 15.1 g/dL — ABNORMAL HIGH (ref 12.0–15.0)
Lymphocytes Relative: 20.9 % (ref 12.0–46.0)
Lymphs Abs: 1.6 10*3/uL (ref 0.7–4.0)
MCHC: 33.7 g/dL (ref 30.0–36.0)
MCV: 110.7 fl — ABNORMAL HIGH (ref 78.0–100.0)
Monocytes Absolute: 0.5 10*3/uL (ref 0.1–1.0)
Monocytes Relative: 6.3 % (ref 3.0–12.0)
Neutro Abs: 5.3 10*3/uL (ref 1.4–7.7)
Neutrophils Relative %: 71.1 % (ref 43.0–77.0)
Platelets: 463 10*3/uL — ABNORMAL HIGH (ref 150.0–400.0)
RBC: 4.04 Mil/uL (ref 3.87–5.11)
RDW: 12.8 % (ref 11.5–15.5)
WBC: 7.5 10*3/uL (ref 4.0–10.5)

## 2023-06-16 LAB — LIPID PANEL
Cholesterol: 164 mg/dL (ref 0–200)
HDL: 59.1 mg/dL (ref >39.00)
NonHDL: 105.11
Total CHOL/HDL Ratio: 3
Triglycerides: 281 mg/dL — ABNORMAL HIGH (ref 0.0–149.0)
VLDL: 56.2 mg/dL — ABNORMAL HIGH (ref 0.0–40.0)

## 2023-06-16 LAB — COMPREHENSIVE METABOLIC PANEL
ALT: 12 U/L (ref 0–35)
AST: 17 U/L (ref 0–37)
Albumin: 4.3 g/dL (ref 3.5–5.2)
Alkaline Phosphatase: 41 U/L (ref 39–117)
BUN: 18 mg/dL (ref 6–23)
CO2: 24 mEq/L (ref 19–32)
Calcium: 9.3 mg/dL (ref 8.4–10.5)
Chloride: 104 mEq/L (ref 96–112)
Creatinine, Ser: 0.79 mg/dL (ref 0.40–1.20)
GFR: 92.57 mL/min (ref >60.00)
Glucose, Bld: 85 mg/dL (ref 70–99)
Potassium: 4.5 mEq/L (ref 3.5–5.1)
Sodium: 139 mEq/L (ref 135–145)
Total Bilirubin: 1.1 mg/dL (ref 0.2–1.2)
Total Protein: 7 g/dL (ref 6.0–8.3)

## 2023-06-16 LAB — LDL CHOLESTEROL, DIRECT: Direct LDL: 85 mg/dL

## 2023-06-16 LAB — TSH: TSH: 0.8 u[IU]/mL (ref 0.35–5.50)

## 2023-06-16 MED ORDER — LOSARTAN POTASSIUM 50 MG PO TABS
50.0000 mg | ORAL_TABLET | Freq: Every day | ORAL | 0 refills | Status: DC
Start: 2023-06-16 — End: 2023-12-03
  Filled 2023-06-16 – 2023-08-24 (×2): qty 90, 90d supply, fill #0

## 2023-06-16 MED ORDER — CONTRAVE 8-90 MG PO TB12
2.0000 | ORAL_TABLET | Freq: Two times a day (BID) | ORAL | 5 refills | Status: DC
Start: 2023-06-16 — End: 2023-12-22
  Filled 2023-06-16 – 2023-08-11 (×2): qty 120, 30d supply, fill #0

## 2023-06-16 NOTE — Assessment & Plan Note (Signed)
Well controlled, no changes to meds. Encouraged heart healthy diet such as the DASH diet and exercise as tolerated.  °

## 2023-06-16 NOTE — Assessment & Plan Note (Signed)
Encourage heart healthy diet such as MIND or DASH diet, increase exercise, avoid trans fats, simple carbohydrates and processed foods, consider a krill or fish or flaxseed oil cap daily.  °

## 2023-06-16 NOTE — Progress Notes (Signed)
Established Patient Office Visit  Subjective   Patient ID: Rachel Hooper, female    DOB: 1981-04-19  Age: 42 y.o. MRN: [id number]  Chief Complaint  Patient presents with   Annual Exam    Pt states fasting     HPI Discussed the use of AI scribe software for clinical note transcription with the patient, who gave verbal consent to proceed.  History of Present Illness   The patient, with a history of weight management issues, is currently on Contrave. She has been managing the medication herself, varying the dosage from one to three tablets a day, depending on her needs. The patient has been sourcing the medication from [facility] mail order pharmacy, and has been taking it for the past five years. The patient is also due for a tetanus shot, which she believes she received five and a half years ago when she returned to work at [employer].      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   Hyperlipidemia 07/06/2021   Toxic effect of venom of bees, unintentional 05/06/2018   Herpes zoster with ophthalmic complication 05/06/2018   Moderate persistent asthma, uncomplicated 03/04/2016   Allergic rhinitis due to pollen 03/04/2016   Insect sting allergy, current reaction 03/04/2016   Gastroesophageal reflux disease without esophagitis 03/04/2016   Deviated nasal septum 03/04/2016   Hymenoptera allergy 07/01/2015   Past Medical History:  Diagnosis Date   Asthma    Endometriosis    GERD (gastroesophageal reflux disease)    History of kidney stones    Hydrosalpinx    left   Hyperlipidemia    Repatha   Hypertension    PAI-1 4G/4G genotype    PCOS (polycystic ovarian syndrome)    PONV (postoperative nausea and vomiting)    severe   Seasonal allergies    Shingles     Past Surgical History:  Procedure Laterality Date   ANTERIOR CRUCIATE LIGAMENT REPAIR Right 1995   CHROMOPERTUBATION N/A 05/03/2015   Procedure: CHROMOPERTUBATION;  Surgeon: Fermin Schwab, MD;  Location: [facility];  Service: Gynecology;  Laterality: N/A;   EXTRACORPOREAL SHOCK WAVE LITHOTRIPSY  2008   LAPAROSCOPY N/A 03/28/2015   Procedure: LAPAROSCOPY DIAGNOSTIC;  Surgeon: Olivia Mackie, MD;  Location: [facility];  Service: Gynecology;  Laterality: N/A;   LAPAROSCOPY N/A 05/03/2015   Procedure: LAPAROSCOPY, EXCISION ENDOMETRIOSIS, ENTERO LYSIS, WITH ENDOMETRIAL BIOPSY;  Surgeon: Fermin Schwab, MD;  Location: [facility];  Service: Gynecology;  Laterality: N/A;   NASAL SEPTOPLASTY W/ TURBINOPLASTY Bilateral 06/20/2017   Procedure: NASAL SEPTOPLASTY WITH BILATERAL TURBINATE REDUCTION;  Surgeon: Christia Reading, MD;  Location: [facility];  Service: ENT;  Laterality: Bilateral;   ROBOTIC ASSISTED TOTAL HYSTERECTOMY N/A 08/16/2021   Procedure: XI ROBOTIC ASSISTED TOTAL HYSTERECTOMY; RIGHT SALPINGO-OOPHERECTOMY;  Surgeon: Carver Fila, MD;  Location: [facility];  Service: Gynecology;  Laterality: N/A;   UNILATERAL SALPINGECTOMY Left 05/03/2015   Procedure: LEFT SALPINGECTOMY;  Surgeon: Fermin Schwab, MD;  Location: [facility] ;  Service: Gynecology;  Laterality: Left;   WISDOM TOOTH EXTRACTION     Social History   Tobacco Use   Smoking status: Never   Smokeless tobacco: Never  Vaping Use   Vaping status: Never Used  Substance Use Topics   Alcohol use: No  Drug use: No   Social History   Socioeconomic History   Marital status: Married    Spouse name: Not on file   Number of children: 1   Years of education: Not on file   Highest education level: Not on file  Occupational History   Occupation: Publishing rights manager    Employer: [employer]  Tobacco Use   Smoking status: Never   Smokeless tobacco: Never  Vaping Use   Vaping status:  Never Used  Substance and Sexual Activity   Alcohol use: No   Drug use: No   Sexual activity: Yes    Partners: Male    Birth control/protection: Pill  Other Topics Concern   Not on file  Social History Narrative   Exercise daily   Social Determinants of Health   Financial Resource Strain: Not on file  Food Insecurity: Not on file  Transportation Needs: Not on file  Physical Activity: Not on file  Stress: Not on file  Social Connections: Unknown (02/18/2022)   Received from [facility]   Social Network    Social Network: Not on file  Intimate Partner Violence: Unknown (01/24/2022)   Received from [facility]   HITS    Physically Hurt: Not on file    Insult or Talk Down To: Not on file    Threaten Physical Harm: Not on file    Scream or Curse: Not on file   Family Status  Relation Name Status   Mother  Alive   Father  Alive   MGM  Deceased   MGF  Deceased   PGM  Alive   Neg Hx  (Not Specified)  No partnership data on file   Family History  Problem Relation Age of Onset   Allergic rhinitis Mother    Asthma Mother    Eczema Mother    Stroke Father    COPD Maternal Grandmother    Colon cancer Maternal Grandmother    Eczema Maternal Grandfather    Dementia Maternal Grandfather    COPD Maternal Grandfather    Stroke Paternal Grandmother    Breast cancer Paternal Grandmother    Angioedema Neg Hx    Immunodeficiency Neg Hx    Urticaria Neg Hx    Allergies  Allergen Reactions   Bee Venom Anaphylaxis    And wasp venom   Other Anaphylaxis   Pneumococcal Vaccines Anaphylaxis   Atorvastatin     Fatigue, Brain Fog, Muscle Pain   Mixed Vespid Venom    Rosuvastatin     Fatigue, Brain Fog, Muscle Pain   Wasp Venom       Review of Systems  Constitutional:  Negative for chills, fever and malaise/fatigue.  HENT:  Negative for congestion and hearing loss.   Eyes:  Negative for blurred vision and discharge.  Respiratory:  Negative for cough, sputum production and  shortness of breath.   Cardiovascular:  Negative for chest pain, palpitations and leg swelling.  Gastrointestinal:  Negative for abdominal pain, blood in stool, constipation, diarrhea, heartburn, nausea and vomiting.  Genitourinary:  Negative for dysuria, frequency, hematuria and urgency.  Musculoskeletal:  Negative for back pain, falls and myalgias.  Skin:  Negative for rash.  Neurological:  Negative for dizziness, sensory change, loss of consciousness, weakness and headaches.  Endo/Heme/Allergies:  Negative for environmental allergies. Does not bruise/bleed easily.  Psychiatric/Behavioral:  Negative for depression and suicidal ideas. The patient is not nervous/anxious and does not have insomnia.       Objective:  BP 110/80 (BP Location: Left Arm, Patient Position: Sitting, Cuff Size: Normal)   Pulse 91   Temp 98.6 F (37 C) (Oral)   Resp 18   Ht 5\' 4"  (1.626 m)   Wt 168 lb 3.2 oz (76.3 kg)   SpO2 99%   BMI 28.87 kg/m  BP Readings from Last 3 Encounters:  06/16/23 110/80  12/16/22 (!) 138/98  12/04/22 (!) 150/96   Wt Readings from Last 3 Encounters:  06/16/23 168 lb 3.2 oz (76.3 kg)  12/16/22 163 lb 3.2 oz (74 kg)  12/04/22 164 lb 14.4 oz (74.8 kg)   SpO2 Readings from Last 3 Encounters:  06/16/23 99%  12/16/22 97%  12/04/22 100%      Physical Exam Vitals and nursing note reviewed.  Constitutional:      General: She is not in acute distress.    Appearance: Normal appearance. She is well-developed.  HENT:     Head: Normocephalic and atraumatic.  Eyes:     General: No scleral icterus.       Right eye: No discharge.        Left eye: No discharge.  Cardiovascular:     Rate and Rhythm: Normal rate and regular rhythm.     Heart sounds: No murmur heard. Pulmonary:     Effort: Pulmonary effort is normal. No respiratory distress.     Breath sounds: Normal breath sounds.  Musculoskeletal:        General: Normal range of motion.     Cervical back: Normal range of  motion and neck supple.     Right lower leg: No edema.     Left lower leg: No edema.  Skin:    General: Skin is warm and dry.  Neurological:     General: No focal deficit present.     Mental Status: She is alert and oriented to person, place, and time.  Psychiatric:        Mood and Affect: Mood normal.        Behavior: Behavior normal.        Thought Content: Thought content normal.        Judgment: Judgment normal.     No results found for any visits on 06/16/23.  Last CBC Lab Results  Component Value Date   WBC 6.6 12/16/2022   HGB 15.6 (H) 12/16/2022   HCT 43.8 12/16/2022   MCV 109.8 (H) 12/16/2022   MCH 38.4 (H) 12/04/2021   RDW 12.5 12/16/2022   PLT 407.0 (H) 12/16/2022   Last metabolic panel Lab Results  Component Value Date   GLUCOSE 81 12/16/2022   NA 140 12/16/2022   K 4.4 12/16/2022   CL 104 12/16/2022   CO2 25 12/16/2022   BUN 19 12/16/2022   CREATININE 0.72 12/16/2022   GFR 103.84 12/16/2022   CALCIUM 10.2 12/16/2022   PROT 7.5 12/16/2022   ALBUMIN 4.5 12/16/2022   BILITOT 0.8 12/16/2022   ALKPHOS 45 12/16/2022   AST 19 12/16/2022   ALT 15 12/16/2022   ANIONGAP 9 12/04/2021   Last lipids Lab Results  Component Value Date   CHOL 150 12/16/2022   HDL 45.30 12/16/2022   LDLCALC 74 12/04/2021   LDLDIRECT 80.0 12/16/2022   TRIG 274.0 (H) 12/16/2022   CHOLHDL 3 12/16/2022   Last hemoglobin A1c No results found for: "HGBA1C" Last thyroid functions Lab Results  Component Value Date   TSH 0.93 12/16/2022   T4TOTAL 11.8 08/24/2007   Last vitamin D No results found  for: "25OHVITD2", "25OHVITD3", "VD25OH" Last vitamin B12 and Folate No results found for: "VITAMINB12", "FOLATE"    The 10-year ASCVD risk score (Arnett DK, et al., 2019) is: 0.5%    Assessment & Plan:   Problem List Items Addressed This Visit       Unprioritized   Preventative health care - Primary   Relevant Medications   losartan (COZAAR) 50 MG tablet   Other Relevant  Orders   CBC with Differential/Platelet   Comprehensive metabolic panel   Lipid panel   TSH   Primary hypertension    Well controlled, no changes to meds. Encouraged heart healthy diet such as the DASH diet and exercise as tolerated.        Relevant Medications   losartan (COZAAR) 50 MG tablet   Other Relevant Orders   Comprehensive metabolic panel   Lipid panel   TSH   Hyperlipidemia    Encourage heart healthy diet such as MIND or DASH diet, increase exercise, avoid trans fats, simple carbohydrates and processed foods, consider a krill or fish or flaxseed oil cap daily.        Relevant Medications   losartan (COZAAR) 50 MG tablet   Other Relevant Orders   Comprehensive metabolic panel   Lipid panel   Other Visit Diagnoses     Morbid obesity (HCC)       Relevant Medications   Naltrexone-buPROPion HCl ER (CONTRAVE) 8-90 MG TB12     Assessment and Plan    Obesity Patient has been on Contrave for weight management for the past five years. The patient has been using back stock for the past four months and is now requesting a refill. The patient's dose varies from 1-3 tablets per day, but for simplicity, it is prescribed as two tablets twice a day. -Continue Contrave two tablets twice a day. -Refill prescription through [facility] mail order pharmacy.  General Health Maintenance -Overdue for tetanus shot, but patient believes it was updated in July 2018 when returning to work at [employer]. -Plan to confirm and update tetanus vaccination status.        No follow-ups on file.    Donato Schultz, DO

## 2023-07-01 ENCOUNTER — Ambulatory Visit (INDEPENDENT_AMBULATORY_CARE_PROVIDER_SITE_OTHER): Payer: Self-pay

## 2023-07-01 ENCOUNTER — Other Ambulatory Visit: Payer: Self-pay

## 2023-07-01 DIAGNOSIS — J309 Allergic rhinitis, unspecified: Secondary | ICD-10-CM | POA: Diagnosis not present

## 2023-07-09 ENCOUNTER — Other Ambulatory Visit: Payer: Self-pay

## 2023-07-14 ENCOUNTER — Other Ambulatory Visit (HOSPITAL_COMMUNITY): Payer: Self-pay

## 2023-07-14 ENCOUNTER — Ambulatory Visit (INDEPENDENT_AMBULATORY_CARE_PROVIDER_SITE_OTHER): Payer: 59

## 2023-07-14 DIAGNOSIS — H04123 Dry eye syndrome of bilateral lacrimal glands: Secondary | ICD-10-CM | POA: Diagnosis not present

## 2023-07-14 DIAGNOSIS — H16301 Unspecified interstitial keratitis, right eye: Secondary | ICD-10-CM | POA: Diagnosis not present

## 2023-07-14 DIAGNOSIS — H25011 Cortical age-related cataract, right eye: Secondary | ICD-10-CM | POA: Diagnosis not present

## 2023-07-14 DIAGNOSIS — J309 Allergic rhinitis, unspecified: Secondary | ICD-10-CM | POA: Diagnosis not present

## 2023-07-14 DIAGNOSIS — H0102A Squamous blepharitis right eye, upper and lower eyelids: Secondary | ICD-10-CM | POA: Diagnosis not present

## 2023-07-14 DIAGNOSIS — H401112 Primary open-angle glaucoma, right eye, moderate stage: Secondary | ICD-10-CM | POA: Diagnosis not present

## 2023-07-14 DIAGNOSIS — H17821 Peripheral opacity of cornea, right eye: Secondary | ICD-10-CM | POA: Diagnosis not present

## 2023-07-14 DIAGNOSIS — H0102B Squamous blepharitis left eye, upper and lower eyelids: Secondary | ICD-10-CM | POA: Diagnosis not present

## 2023-07-14 MED ORDER — TIMOLOL MALEATE PF 0.5 % OP SOLN
1.0000 [drp] | OPHTHALMIC | 0 refills | Status: DC
  Filled 2023-07-14: qty 60, 30d supply, fill #0

## 2023-07-17 DIAGNOSIS — H4061X0 Glaucoma secondary to drugs, right eye, stage unspecified: Secondary | ICD-10-CM | POA: Diagnosis not present

## 2023-07-17 DIAGNOSIS — T380X5A Adverse effect of glucocorticoids and synthetic analogues, initial encounter: Secondary | ICD-10-CM | POA: Diagnosis not present

## 2023-07-18 ENCOUNTER — Other Ambulatory Visit: Payer: Self-pay

## 2023-07-21 ENCOUNTER — Ambulatory Visit (INDEPENDENT_AMBULATORY_CARE_PROVIDER_SITE_OTHER): Payer: 59

## 2023-07-21 DIAGNOSIS — J309 Allergic rhinitis, unspecified: Secondary | ICD-10-CM

## 2023-07-29 ENCOUNTER — Ambulatory Visit (INDEPENDENT_AMBULATORY_CARE_PROVIDER_SITE_OTHER): Payer: 59 | Admitting: *Deleted

## 2023-07-29 DIAGNOSIS — T63441D Toxic effect of venom of bees, accidental (unintentional), subsequent encounter: Secondary | ICD-10-CM | POA: Diagnosis not present

## 2023-08-05 ENCOUNTER — Ambulatory Visit: Payer: 59

## 2023-08-09 MED FILL — Evolocumab Subcutaneous Soln Auto-Injector 140 MG/ML: SUBCUTANEOUS | 84 days supply | Qty: 6 | Fill #1 | Status: AC

## 2023-08-11 ENCOUNTER — Other Ambulatory Visit: Payer: Self-pay

## 2023-08-11 MED ORDER — POTASSIUM CITRATE ER 10 MEQ (1080 MG) PO TBCR
20.0000 meq | EXTENDED_RELEASE_TABLET | Freq: Two times a day (BID) | ORAL | 3 refills | Status: DC
  Filled 2023-08-11: qty 360, 90d supply, fill #0

## 2023-08-12 ENCOUNTER — Other Ambulatory Visit: Payer: Self-pay

## 2023-08-13 ENCOUNTER — Other Ambulatory Visit: Payer: Self-pay

## 2023-08-18 ENCOUNTER — Other Ambulatory Visit: Payer: Self-pay

## 2023-08-18 ENCOUNTER — Telehealth: Payer: Self-pay

## 2023-08-18 ENCOUNTER — Ambulatory Visit (INDEPENDENT_AMBULATORY_CARE_PROVIDER_SITE_OTHER): Payer: 59

## 2023-08-18 ENCOUNTER — Other Ambulatory Visit (HOSPITAL_COMMUNITY): Payer: Self-pay

## 2023-08-18 DIAGNOSIS — J309 Allergic rhinitis, unspecified: Secondary | ICD-10-CM | POA: Diagnosis not present

## 2023-08-18 NOTE — Telephone Encounter (Signed)
Pharmacy Patient Advocate Encounter   Received notification from CoverMyMeds that prior authorization for Contrave 8-90MG  er tablets is required/requested.   Insurance verification completed.   The patient is insured through [facility] .   Per test claim: PA required; PA submitted to [facility] via CoverMyMeds Key/confirmation #/EOC [id number] Status is pending

## 2023-08-24 ENCOUNTER — Other Ambulatory Visit: Payer: Self-pay

## 2023-08-26 ENCOUNTER — Other Ambulatory Visit: Payer: Self-pay

## 2023-08-28 ENCOUNTER — Encounter: Payer: Self-pay | Admitting: Internal Medicine

## 2023-08-28 ENCOUNTER — Ambulatory Visit: Payer: 59 | Attending: Internal Medicine | Admitting: Internal Medicine

## 2023-08-28 VITALS — BP 130/88 | HR 90 | Ht 64.0 in | Wt 168.0 lb

## 2023-08-28 DIAGNOSIS — E781 Pure hyperglyceridemia: Secondary | ICD-10-CM | POA: Diagnosis not present

## 2023-08-28 DIAGNOSIS — E785 Hyperlipidemia, unspecified: Secondary | ICD-10-CM | POA: Diagnosis not present

## 2023-08-28 DIAGNOSIS — Z789 Other specified health status: Secondary | ICD-10-CM | POA: Diagnosis not present

## 2023-08-28 NOTE — Progress Notes (Signed)
LIPID CLINIC CONSULT NOTE  Chief Complaint:  Manage dyslipidemia  Primary Care Physician: Zola Button, Grayling Congress, DO  Primary Cardiologist:  None  HPI:  Rachel Hooper is a 42 y.o. female who is being seen today for the evaluation of dyslipidemia at the request of Donato Schultz, *.  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  She has a longstanding history of dyslipidemia and in the past she said had triglycerides as high as over 1500.  She is also had elevated LDL cholesterol.  She is lost recently a lot of weight but unfortunately had a problem where she developed ocular shingles.  This is required steroids and immunosuppression and currently she is on azathioprine have been being managed by an ocular immunologist at [facility].  She has had marked improvement in her lipids on therapy but reports significant fatigue.  Part of this was thought to be due to bradycardia on higher dose beta-blocker.  This was because she had had a history of tachycardia or palpitations related to stimulant weight loss medications.  She currently is on Contrave.  She decreased the dose of her metoprolol from 100 to 50 mg with improvement in heart rate although blood pressure today was again low at 98/70.  She does have family history of stroke in her father early at age 6.  Other medical problems include glaucoma, asthma, reflux, and PCOS.  She also had issues with infertility.  She thinks that part of her symptoms of fatigue might be related to her statin but has not tried reducing the dose or taking a statin holiday.  Her last lipid profile in June 2021 showed total cholesterol 181, triglycerides 207, HDL 48 and LDL of 98.  08/28/2023  Rachel Hooper is seen today in follow-up.  She continues to do well on Repatha.  Her lipids are generally well-controlled.  Her goal LDL is less than 100 and recent  direct LDL was 85, total cholesterol 164, triglycerides 281 and HDL 59.  This was nonfasting.  She has not had any side effects with the medication.  She did have a calcium score in 2022 which was 0.  PMHx:  Past Medical History:  Diagnosis Date   Asthma    Endometriosis    GERD (gastroesophageal reflux disease)    History of kidney stones    Hydrosalpinx    left   Hyperlipidemia    Repatha   Hypertension    PAI-1 4G/4G genotype    PCOS (polycystic ovarian syndrome)    PONV (postoperative nausea and vomiting)    severe   Seasonal allergies    Shingles     Past Surgical History:  Procedure Laterality Date   ANTERIOR CRUCIATE LIGAMENT REPAIR Right 1995   CHROMOPERTUBATION N/A 05/03/2015   Procedure: CHROMOPERTUBATION;  Surgeon: Fermin Schwab, MD;  Location: [facility] [place];  Service: Gynecology;  Laterality: N/A;   EXTRACORPOREAL SHOCK WAVE LITHOTRIPSY  2008   LAPAROSCOPY N/A 03/28/2015   Procedure: LAPAROSCOPY DIAGNOSTIC;  Surgeon: Olivia Mackie, MD;  Location: [facility];  Service: Gynecology;  Laterality: N/A;   LAPAROSCOPY N/A 05/03/2015   Procedure: LAPAROSCOPY, EXCISION ENDOMETRIOSIS, ENTERO LYSIS, WITH ENDOMETRIAL BIOPSY;  Surgeon: Fermin Schwab, MD;  Location: [facility];  Service: Gynecology;  Laterality: N/A;   NASAL SEPTOPLASTY W/ TURBINOPLASTY Bilateral 06/20/2017   Procedure: NASAL SEPTOPLASTY WITH BILATERAL TURBINATE REDUCTION;  Surgeon: Christia Reading, MD;  Location: [facility];  Service: ENT;  Laterality: Bilateral;   ROBOTIC ASSISTED TOTAL HYSTERECTOMY N/A 08/16/2021   Procedure: XI ROBOTIC ASSISTED TOTAL HYSTERECTOMY; RIGHT SALPINGO-OOPHERECTOMY;  Surgeon: Carver Fila, MD;  Location: [facility];  Service: Gynecology;  Laterality: N/A;   UNILATERAL SALPINGECTOMY Left 05/03/2015   Procedure: LEFT SALPINGECTOMY;  Surgeon: Fermin Schwab, MD;  Location: [facility];  Service: Gynecology;  Laterality: Left;   WISDOM TOOTH  EXTRACTION      FAMHx:  Family History  Problem Relation Age of Onset   Allergic rhinitis Mother    Asthma Mother    Eczema Mother    Stroke Father    COPD Maternal Grandmother    Colon cancer Maternal Grandmother    Eczema Maternal Grandfather    Dementia Maternal Grandfather    COPD Maternal Grandfather    Stroke Paternal Grandmother    Breast cancer Paternal Grandmother    Angioedema Neg Hx    Immunodeficiency Neg Hx    Urticaria Neg Hx     SOCHx:   reports that she has never smoked. She has never used smokeless tobacco. She reports that she does not drink alcohol and does not use drugs.  ALLERGIES:  Allergies  Allergen Reactions   Bee Venom Anaphylaxis    And wasp venom   Other Anaphylaxis   Pneumococcal Vaccines Anaphylaxis   Atorvastatin     Fatigue, Brain Fog, Muscle Pain   Mixed Vespid Venom    Rosuvastatin     Fatigue, Brain Fog, Muscle Pain   Wasp Venom     ROS: Pertinent items noted in HPI and remainder of comprehensive ROS otherwise negative.  HOME MEDS: Current Outpatient Medications on File Prior to Visit  Medication Sig Dispense Refill   albuterol (VENTOLIN HFA) 108 (90 Base) MCG/ACT inhaler Inhale 2 puffs into the lungs every 6 (six) hours as needed for wheezing or shortness of breath. 6.7 g 1   azaTHIOprine (IMURAN) 50 MG tablet Take 1 tablet (50 mg total) by mouth 3 (three) times daily. 270 tablet 2   budesonide (PULMICORT FLEXHALER) 180 MCG/ACT inhaler Inhale 2 puffs into the lungs 2 (two) times daily to prevent cough or wheeze. Rinse, gargle, spit after use. 3 each 5   cetirizine (ZYRTEC) 10 MG tablet Take 1 tablet (10 mg total) by mouth at bedtime. 30 tablet 5   EPINEPHrine 0.3 mg/0.3 mL IJ SOAJ injection Inject 0.3 mg into the muscle as needed for anaphylaxis. 2 each 1   Evolocumab (REPATHA SURECLICK) 140 MG/ML SOAJ Inject 140 mg into the skin every 14 (fourteen) days. 6 mL 3   losartan (COZAAR) 50 MG tablet Take 1 tablet (50 mg total) by  mouth daily. 90 tablet 0   Loteprednol Etabonate (LOTEMAX SM) 0.38 % GEL Place 1 drop into both eyes 2 (two) times daily. 5 g 11   magnesium oxide-pyridoxine (BEELITH) 362-20 MG TABS Take 1 tablet by mouth every evening.      metoprolol succinate (TOPROL-XL) 50 MG 24 hr tablet Take 1 tablet (50 mg total) by mouth daily. Take with or immediately following a meal. 90 tablet 3   montelukast (SINGULAIR) 10 MG tablet Take 1 tablet (10 mg total) by mouth at bedtime. 30 tablet 5   Naltrexone-buPROPion HCl ER (CONTRAVE) 8-90 MG TB12 Take 2 tablets twice a day by oral route for 90 days. 360 tablet 0   Norethindrone Acetate-Ethinyl Estradiol (LARIN 1.5/30) 1.5-30 MG-MCG tablet TAKE 1 TABLET BY MOUTH DAILY (CONTINUOUS ACTIVE PILLS) 84  tablet 3   potassium citrate (UROCIT-K) 10 MEQ (1080 MG) SR tablet Take 2 tablets (20 mEq total) by mouth 2 (two) times daily. 120 tablet 3   timolol (TIMOPTIC) 0.5 % ophthalmic solution Place 1 drop into the right eye daily. 5 mL 11   Timolol Maleate PF 0.5 % SOLN Place 1 drop into affected eyes 2 (two) times daily (morning and at noon) 180 each 11   valACYclovir (VALTREX) 1000 MG tablet Take 1 tablet (1,000 mg total) by mouth daily. 90 tablet 11   azaTHIOprine (IMURAN) 50 MG tablet Take 1 tablet (50 mg total) by mouth 3 (three) times daily. (Patient not taking: Reported on 08/28/2023) 270 tablet 2   azaTHIOprine (IMURAN) 50 MG tablet Take 1 tablet (50 mg total) by mouth 3 (three) times daily. (Patient not taking: Reported on 08/28/2023) 270 tablet 2   budesonide (RHINOCORT ALLERGY) 32 MCG/ACT nasal spray ONE SPRAY EACH NOSTRIL TWICE A DAY FOR NASAL CONGESTION OR DRAINAGE. (Patient not taking: Reported on 08/28/2023) 8.6 g 5   Loteprednol Etabonate (LOTEMAX SM) 0.38 % GEL Place 1 drop into the right eye in the morning and at bedtime. (Patient not taking: Reported on 08/28/2023) 5 g 11   Loteprednol Etabonate (LOTEMAX SM) 0.38 % GEL Place 1 drop into affected eyes 2 (two) times daily  (morning and at bedtime) (Patient not taking: Reported on 08/28/2023) 5 g 11   Naltrexone-buPROPion HCl ER (CONTRAVE) 8-90 MG TB12 Take 2 tablets by mouth 2 (two) times daily. (Patient not taking: Reported on 08/28/2023) 120 tablet 5   timolol (TIMOPTIC) 0.5 % ophthalmic solution Place 1 drop into the right eye daily. (Patient not taking: Reported on 08/28/2023) 5 mL 11   Timolol Maleate PF 0.5 % SOLN Place 1 drop into the right eye every morning for 30 days 60 each 0   valACYclovir (VALTREX) 1000 MG tablet Take 1 tablet (1,000 mg total) by mouth daily. (Patient not taking: Reported on 08/28/2023) 90 tablet 11   valACYclovir (VALTREX) 1000 MG tablet Take 1 tablet (1,000 mg total) by mouth daily. (Patient not taking: Reported on 08/28/2023) 90 tablet 11   No current facility-administered medications on file prior to visit.    LABS/IMAGING: No results found for this or any previous visit (from the past 48 hour(s)). No results found.  LIPID PANEL:    Component Value Date/Time   CHOL 164 06/16/2023 1123   TRIG 281.0 (H) 06/16/2023 1123   TRIG 135 06/27/2021 0840   HDL 59.10 06/16/2023 1123   HDL 56 06/27/2021 0840   CHOLHDL 3 06/16/2023 1123   VLDL 56.2 (H) 06/16/2023 1123   LDLCALC 74 12/04/2021 0846   LDLDIRECT 85.0 06/16/2023 1123    WEIGHTS: Wt Readings from Last 3 Encounters:  08/28/23 168 lb (76.2 kg)  06/16/23 168 lb 3.2 oz (76.3 kg)  12/16/22 163 lb 3.2 oz (74 kg)    VITALS: BP 130/88 (BP Location: Left Arm, Patient Position: Sitting, Cuff Size: Normal)   Pulse 90   Ht 5\' 4"  (1.626 m)   Wt 168 lb (76.2 kg)   SpO2 99%   BMI 28.84 kg/m   EXAM: Deferred  EKG: Deferred  ASSESSMENT: Mixed dyslipidemia, goal LDL <100 Possible statin intolerance Family history of early stroke 0 CAC score (2022)  PLAN: 1.   Rachel Hooper is doing well on Repatha.  Her LDL at remains at target less than 100.  She had no coronary calcium in 2022.  She is tolerating Repatha well.  Triglycerides were elevated however nonfasting.  I would not be aggressive about treating her triglycerides unless they were over 500.  Will continue with her current therapies.  Plan follow-up with me annually or sooner as necessary.  Rachel Nose, MD, [facility], FACP  [place]  [facility]  Medical Director of the Advanced Lipid Disorders &  Cardiovascular Risk Reduction Clinic Diplomate of the American Board of Clinical Lipidology Attending Cardiologist  Direct Dial: [phone]  Fax: [phone]  Website:  [email or web address] Nicely Rachel Hooper 08/28/2023, 9:05 AM

## 2023-08-28 NOTE — Patient Instructions (Signed)
Medication Instructions:  NO CHANGES  *If you need a refill on your cardiac medications before your next appointment, please call your pharmacy*  Lab Work: FASTING lab work in 1 year to check cholesterol   If you have labs (blood work) drawn today and your tests are completely normal, you will receive your results only by: MyChart Message (if you have MyChart) OR A paper copy in the mail If you have any lab test that is abnormal or we need to change your treatment, we will call you to review the results.  Follow-Up: At [facility], you and your health needs are our priority.  As part of our continuing mission to provide you with exceptional heart care, we have created designated Provider Care Teams.  These Care Teams include your primary Cardiologist (physician) and Advanced Practice Providers (APPs -  Physician Assistants and Nurse Practitioners) who all work together to provide you with the care you need, when you need it.  We recommend signing up for the patient portal called "MyChart".  Sign up information is provided on this After Visit Summary.  MyChart is used to connect with patients for Virtual Visits (Telemedicine).  Patients are able to view lab/test results, encounter notes, upcoming appointments, etc.  Non-urgent messages can be sent to your provider as well.   To learn more about what you can do with MyChart, go to [email or web address].    Your next appointment:   12 months with Dr. Rennis Golden

## 2023-08-29 NOTE — Telephone Encounter (Signed)
Called to check status of PA. Insurance needed starting weight. Gave information and status is pending. Ref# [id number]

## 2023-09-01 ENCOUNTER — Other Ambulatory Visit: Payer: Self-pay

## 2023-09-02 ENCOUNTER — Other Ambulatory Visit: Payer: Self-pay

## 2023-09-05 ENCOUNTER — Other Ambulatory Visit (HOSPITAL_COMMUNITY): Payer: Self-pay

## 2023-09-08 ENCOUNTER — Ambulatory Visit (INDEPENDENT_AMBULATORY_CARE_PROVIDER_SITE_OTHER): Payer: 59 | Admitting: *Deleted

## 2023-09-08 DIAGNOSIS — J309 Allergic rhinitis, unspecified: Secondary | ICD-10-CM

## 2023-09-09 NOTE — Telephone Encounter (Signed)
Pharmacy Patient Advocate Encounter  Received notification from [facility] that Prior Authorization for Contrave 8-90MG  er tablets  has been DENIED.  See denial reason below. No denial letter attached in CMM. Will attach denial letter to Media tab once received.   PA #/Case ID/Reference #: [id number]   DENIAL REASON: When used for weight loss or weight management, our guideline named ANTI-OBESITY AGENTS (reviewed for Contrave) requires that you have achieved or maintained at least 5% weight loss of baseline body weight (weight before starting Contrave) after 3 months of treatment at the maintenance dose (two Contrave 8-90mg  tablets twice daily). This request has been denied because we did not receive information that you meet the requirement

## 2023-09-15 ENCOUNTER — Other Ambulatory Visit: Payer: Self-pay

## 2023-10-06 ENCOUNTER — Other Ambulatory Visit: Payer: Self-pay

## 2023-10-06 ENCOUNTER — Ambulatory Visit (INDEPENDENT_AMBULATORY_CARE_PROVIDER_SITE_OTHER): Payer: Self-pay | Admitting: *Deleted

## 2023-10-06 DIAGNOSIS — J309 Allergic rhinitis, unspecified: Secondary | ICD-10-CM | POA: Diagnosis not present

## 2023-10-06 MED ORDER — VALACYCLOVIR HCL 1 G PO TABS
1000.0000 mg | ORAL_TABLET | Freq: Every day | ORAL | 11 refills | Status: DC
  Filled 2023-10-21: qty 90, 90d supply, fill #0
  Filled 2023-12-12: qty 90, 90d supply, fill #1

## 2023-10-09 ENCOUNTER — Other Ambulatory Visit: Payer: Self-pay

## 2023-10-10 ENCOUNTER — Other Ambulatory Visit: Payer: Self-pay

## 2023-10-10 ENCOUNTER — Other Ambulatory Visit (HOSPITAL_COMMUNITY): Payer: Self-pay

## 2023-10-10 ENCOUNTER — Telehealth: Payer: Self-pay

## 2023-10-10 NOTE — Telephone Encounter (Signed)
[employer] called in and stated that she has tried Flovent/Arnuity and it causes her to have shortness of breath, wheezing, and exercise intolerance.  Rhianne states that Budesonide is tolerated.

## 2023-10-10 NOTE — Telephone Encounter (Signed)
Pharmacy Patient Advocate Encounter   Received notification from Fax that prior authorization for Pulmicort is required/requested.   Insurance verification completed.   The patient is insured through [facility] .   Per test claim:  Trial of Arnuity within the past 120 days is preferred by the insurance.  If suggested medication is appropriate, Please send in a new RX and discontinue this one. If not, please advise as to why it's not appropriate so that we may request a Prior Authorization. Please note, some preferred medications may still require a PA

## 2023-10-10 NOTE — Telephone Encounter (Signed)
Pharmacy Patient Advocate Encounter   Received notification from CoverMyMeds that prior authorization for Pulmicort is required/requested.   Insurance verification completed.   The patient is insured through [facility] .   Per test claim: PA required; PA submitted to above mentioned insurance via CoverMyMeds Key/confirmation #/EOC [id number] Status is pending

## 2023-10-13 ENCOUNTER — Other Ambulatory Visit: Payer: Self-pay

## 2023-10-13 NOTE — Telephone Encounter (Signed)
Called [facility] - spoke to [facility], Pharmacist - DOB verified - advised of below notation.  Pam confirmed PA Approval - will process prescription and notify patient when it is ready for p/u.    Called patient - DOB/NEED DPR - LMOVM to check myChart or contact the office regarding purpose of call.  When/If patient call back - please advise of above notation.

## 2023-10-13 NOTE — Telephone Encounter (Signed)
Pharmacy Patient Advocate Encounter  Received notification from [facility] that Prior Authorization for Pulmicort Flexhaler [employer]MCG/ACT aerosol powder has been APPROVED from 10-10-2023 to 10-08-2024   PA #/Case ID/Reference #: [id number]

## 2023-10-17 ENCOUNTER — Other Ambulatory Visit: Payer: Self-pay

## 2023-10-20 ENCOUNTER — Telehealth: Payer: Self-pay | Admitting: *Deleted

## 2023-10-20 NOTE — Telephone Encounter (Signed)
Patient called back and stated that Symbicort 160 and Airsupra are good alternatives to switch to pre her insurance, [employer].

## 2023-10-21 ENCOUNTER — Other Ambulatory Visit: Payer: Self-pay

## 2023-10-21 ENCOUNTER — Encounter: Payer: Self-pay | Admitting: Allergy

## 2023-10-21 ENCOUNTER — Ambulatory Visit (INDEPENDENT_AMBULATORY_CARE_PROVIDER_SITE_OTHER): Payer: 59 | Admitting: *Deleted

## 2023-10-21 DIAGNOSIS — T63441D Toxic effect of venom of bees, accidental (unintentional), subsequent encounter: Secondary | ICD-10-CM | POA: Diagnosis not present

## 2023-10-21 DIAGNOSIS — J309 Allergic rhinitis, unspecified: Secondary | ICD-10-CM | POA: Diagnosis not present

## 2023-10-21 MED ORDER — AIRSUPRA 90-80 MCG/ACT IN AERO
2.0000 | INHALATION_SPRAY | RESPIRATORY_TRACT | 0 refills | Status: DC | PRN
  Filled 2023-10-21: qty 32.1, 75d supply, fill #0

## 2023-10-21 MED ORDER — BUDESONIDE-FORMOTEROL FUMARATE 160-4.5 MCG/ACT IN AERO
2.0000 | INHALATION_SPRAY | Freq: Two times a day (BID) | RESPIRATORY_TRACT | 1 refills | Status: DC
  Filled 2023-10-21: qty 30.6, 90d supply, fill #0
  Filled 2024-03-18: qty 30.6, 90d supply, fill #1

## 2023-10-21 NOTE — Addendum Note (Signed)
 Addended by: Dub Mikes on: 10/21/2023 11:46 AM   Modules accepted: Orders

## 2023-10-21 NOTE — Telephone Encounter (Signed)
Patient has been made aware.

## 2023-10-21 NOTE — Addendum Note (Signed)
 Addended by: Dub Mikes on: 10/21/2023 07:59 AM   Modules accepted: Orders

## 2023-10-21 NOTE — Addendum Note (Signed)
 Addended by: Dub Mikes on: 10/21/2023 08:18 AM   Modules accepted: Orders

## 2023-10-21 NOTE — Telephone Encounter (Signed)
 Patient is requesting Symbicort  160 and Airsupra  be sent in as they are cover by her insurance. She stated the Pulmicort  was $100 copay even after the prior authorization was approved. Patient is needing this done today per pharmacy. Dr. Jeneal is it okay to send the Symbicort  160 and the Airsupra ? The Airsupra  was never sent in.

## 2023-10-24 ENCOUNTER — Other Ambulatory Visit: Payer: Self-pay | Admitting: *Deleted

## 2023-10-24 ENCOUNTER — Encounter: Payer: Self-pay | Admitting: Allergy

## 2023-10-24 ENCOUNTER — Other Ambulatory Visit: Payer: Self-pay

## 2023-10-24 MED ORDER — PULMICORT FLEXHALER 90 MCG/ACT IN AEPB
2.0000 | INHALATION_SPRAY | Freq: Two times a day (BID) | RESPIRATORY_TRACT | 5 refills | Status: DC
  Filled 2023-10-24: qty 1, 30d supply, fill #0
  Filled 2023-12-15: qty 1, 30d supply, fill #1

## 2023-10-24 MED ORDER — BUDESONIDE 180 MCG/ACT IN AEPB
2.0000 | INHALATION_SPRAY | Freq: Two times a day (BID) | RESPIRATORY_TRACT | 0 refills | Status: DC
  Filled 2023-10-24: qty 3, 90d supply, fill #0

## 2023-10-27 ENCOUNTER — Other Ambulatory Visit: Payer: Self-pay

## 2023-10-30 ENCOUNTER — Other Ambulatory Visit: Payer: Self-pay

## 2023-11-20 ENCOUNTER — Ambulatory Visit (INDEPENDENT_AMBULATORY_CARE_PROVIDER_SITE_OTHER): Payer: 59 | Admitting: *Deleted

## 2023-11-20 DIAGNOSIS — J309 Allergic rhinitis, unspecified: Secondary | ICD-10-CM | POA: Diagnosis not present

## 2023-11-30 ENCOUNTER — Other Ambulatory Visit: Payer: Self-pay | Admitting: Allergy

## 2023-12-01 ENCOUNTER — Telehealth: Payer: Self-pay | Admitting: Allergy

## 2023-12-01 ENCOUNTER — Other Ambulatory Visit: Payer: Self-pay

## 2023-12-01 MED ORDER — MONTELUKAST SODIUM 10 MG PO TABS
10.0000 mg | ORAL_TABLET | Freq: Every day | ORAL | 5 refills | Status: DC
  Filled 2023-12-01: qty 30, 30d supply, fill #0
  Filled 2023-12-01: qty 90, 90d supply, fill #0
  Filled 2024-02-18: qty 90, 90d supply, fill #1

## 2023-12-01 NOTE — Telephone Encounter (Signed)
 Patient called asking for a refill on SINGULAIR  advise it has been almost a year since she has been seen patient made an appointment for April 10th advise I can't promise this could be filled until seen

## 2023-12-01 NOTE — Telephone Encounter (Signed)
 Patient called and spoke with me. Per Dr. Gerold Kos note she said to follow up in a year and since she is booked out I went on ahead an sent in enough refills to get the patient to her appointment. Patient verbalized understanding. Refills for Montelukast  have been sent in.

## 2023-12-02 ENCOUNTER — Other Ambulatory Visit: Payer: Self-pay

## 2023-12-02 DIAGNOSIS — B0052 Herpesviral keratitis: Secondary | ICD-10-CM | POA: Diagnosis not present

## 2023-12-02 DIAGNOSIS — H4061X0 Glaucoma secondary to drugs, right eye, stage unspecified: Secondary | ICD-10-CM | POA: Diagnosis not present

## 2023-12-02 DIAGNOSIS — T380X5A Adverse effect of glucocorticoids and synthetic analogues, initial encounter: Secondary | ICD-10-CM | POA: Diagnosis not present

## 2023-12-02 DIAGNOSIS — H209 Unspecified iridocyclitis: Secondary | ICD-10-CM | POA: Diagnosis not present

## 2023-12-02 MED ORDER — AZATHIOPRINE 50 MG PO TABS
50.0000 mg | ORAL_TABLET | Freq: Three times a day (TID) | ORAL | 2 refills | Status: AC
  Filled 2023-12-02: qty 270, 90d supply, fill #0
  Filled 2024-03-25: qty 270, 90d supply, fill #1
  Filled 2024-08-20: qty 270, 90d supply, fill #2

## 2023-12-02 MED ORDER — VALACYCLOVIR HCL 1 G PO TABS
1.0000 g | ORAL_TABLET | Freq: Two times a day (BID) | ORAL | 5 refills | Status: AC
  Filled 2023-12-02 – 2023-12-12 (×6): qty 180, 90d supply, fill #0
  Filled 2024-04-22: qty 172, 86d supply, fill #1
  Filled 2024-04-22: qty 8, 4d supply, fill #1
  Filled 2024-11-02: qty 180, 90d supply, fill #0
  Filled 2024-11-02: qty 180, 90d supply, fill #2

## 2023-12-02 MED ORDER — TIMOLOL MALEATE PF 0.5 % OP SOLN
1.0000 [drp] | Freq: Two times a day (BID) | OPHTHALMIC | 11 refills | Status: AC
  Filled 2023-12-02: qty 180, 90d supply, fill #0
  Filled 2024-10-16: qty 180, 90d supply, fill #1

## 2023-12-02 MED ORDER — LOTEMAX SM 0.38 % OP GEL
1.0000 [drp] | Freq: Two times a day (BID) | OPHTHALMIC | 11 refills | Status: AC
  Filled 2023-12-02: qty 5, 25d supply, fill #0
  Filled 2024-05-24: qty 5, 25d supply, fill #1
  Filled 2024-10-16: qty 5, 25d supply, fill #2

## 2023-12-03 ENCOUNTER — Other Ambulatory Visit: Payer: Self-pay

## 2023-12-03 ENCOUNTER — Other Ambulatory Visit: Payer: Self-pay | Admitting: Family Medicine

## 2023-12-03 ENCOUNTER — Encounter: Payer: Self-pay | Admitting: Family Medicine

## 2023-12-03 DIAGNOSIS — Z0000 Encounter for general adult medical examination without abnormal findings: Secondary | ICD-10-CM

## 2023-12-03 DIAGNOSIS — I10 Essential (primary) hypertension: Secondary | ICD-10-CM

## 2023-12-03 MED ORDER — LOSARTAN POTASSIUM 50 MG PO TABS
50.0000 mg | ORAL_TABLET | Freq: Every day | ORAL | 0 refills | Status: DC
  Filled 2023-12-03: qty 90, 90d supply, fill #0

## 2023-12-03 MED FILL — Evolocumab Subcutaneous Soln Auto-Injector 140 MG/ML: SUBCUTANEOUS | 84 days supply | Qty: 6 | Fill #2 | Status: AC

## 2023-12-04 ENCOUNTER — Other Ambulatory Visit: Payer: Self-pay

## 2023-12-04 DIAGNOSIS — N3010 Interstitial cystitis (chronic) without hematuria: Secondary | ICD-10-CM | POA: Diagnosis not present

## 2023-12-04 DIAGNOSIS — N200 Calculus of kidney: Secondary | ICD-10-CM | POA: Diagnosis not present

## 2023-12-04 DIAGNOSIS — R82992 Hyperoxaluria: Secondary | ICD-10-CM | POA: Diagnosis not present

## 2023-12-05 ENCOUNTER — Other Ambulatory Visit: Payer: Self-pay

## 2023-12-08 ENCOUNTER — Other Ambulatory Visit: Payer: Self-pay

## 2023-12-12 ENCOUNTER — Other Ambulatory Visit: Payer: Self-pay

## 2023-12-15 ENCOUNTER — Other Ambulatory Visit: Payer: Self-pay

## 2023-12-16 ENCOUNTER — Ambulatory Visit (INDEPENDENT_AMBULATORY_CARE_PROVIDER_SITE_OTHER): Payer: Self-pay | Admitting: *Deleted

## 2023-12-16 DIAGNOSIS — J309 Allergic rhinitis, unspecified: Secondary | ICD-10-CM | POA: Diagnosis not present

## 2023-12-19 ENCOUNTER — Ambulatory Visit: Payer: 59 | Admitting: Family Medicine

## 2023-12-22 ENCOUNTER — Encounter: Payer: Self-pay | Admitting: Family Medicine

## 2023-12-22 ENCOUNTER — Ambulatory Visit: Admitting: Family Medicine

## 2023-12-22 ENCOUNTER — Other Ambulatory Visit: Payer: Self-pay

## 2023-12-22 DIAGNOSIS — Z6828 Body mass index (BMI) 28.0-28.9, adult: Secondary | ICD-10-CM

## 2023-12-22 DIAGNOSIS — Z0000 Encounter for general adult medical examination without abnormal findings: Secondary | ICD-10-CM

## 2023-12-22 DIAGNOSIS — E785 Hyperlipidemia, unspecified: Secondary | ICD-10-CM

## 2023-12-22 DIAGNOSIS — I10 Essential (primary) hypertension: Secondary | ICD-10-CM

## 2023-12-22 MED ORDER — CONTRAVE 8-90 MG PO TB12
2.0000 | ORAL_TABLET | Freq: Two times a day (BID) | ORAL | 5 refills | Status: DC
Start: 2023-12-22 — End: 2024-09-13
  Filled 2023-12-22: qty 120, 30d supply, fill #0

## 2023-12-22 MED ORDER — METOPROLOL SUCCINATE ER 50 MG PO TB24
50.0000 mg | ORAL_TABLET | Freq: Every day | ORAL | 3 refills | Status: AC
  Filled 2023-12-22 – 2024-10-16 (×2): qty 90, 90d supply, fill #0

## 2023-12-22 MED ORDER — LOSARTAN POTASSIUM 50 MG PO TABS
50.0000 mg | ORAL_TABLET | Freq: Every day | ORAL | 3 refills | Status: AC
  Filled 2023-12-22 – 2024-03-05 (×2): qty 90, 90d supply, fill #0
  Filled 2024-05-24: qty 90, 90d supply, fill #1
  Filled 2024-08-22: qty 90, 90d supply, fill #2
  Filled 2024-11-12: qty 90, 90d supply, fill #3
  Filled 2024-11-12: qty 90, 90d supply, fill #0

## 2023-12-22 NOTE — Progress Notes (Unsigned)
 Established Patient Office Visit  Subjective   Patient ID: Rachel Hooper, female    DOB: Nov 02, 1980  Age: 43 y.o. MRN: [id number]  Chief Complaint  Patient presents with   Hypertension   Hyperlipidemia   Follow-up    HPI Discussed the use of AI scribe software for clinical note transcription with the patient, who gave verbal consent to proceed.  History of Present Illness   Rachel Hooper is a 43 year old female with familial hypercholesterolemia who presents for medication management and weight maintenance.  She is currently taking Contrave for weight management and has successfully maintained her weight, which was previously at a high of 244 pounds. She has not experienced further weight gain. She cycles off Contrave to phentermine periodically to manage tolerance. She had considered using injections for weight management but encountered insurance coverage issues.  She has familial hypercholesterolemia and is currently on Repatha. She cannot tolerate statins due to adverse effects, which include a 'weird fog' and inability to function. Her sister also has familial hypercholesterolemia and is on Repatha. She has been on Repatha for a couple of years and finds it beneficial as it eliminates the need for daily medication. She notes variability in the injection experience, sometimes experiencing burning and other times not.  She mentions a past hysterectomy, which was necessary due to excessive bleeding during exercise. This has contributed to weight gain, and she is currently working to lose the weight gained post-surgery. She is aware that the lack of hormones post-hysterectomy makes weight loss more challenging.  Her current medications include Cozaar, metoprolol, and Contrave. She takes Contrave at a dose of one pill twice a day, having previously lost 80 pounds on a lower dose.  She has undergone extensive lab work in the past, including tests for familial hypercholesterolemia,  such as lipoprotein A and NMR, although she notes that insurance coverage for these tests has been inconsistent.      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   Hyperlipidemia 07/06/2021   Toxic effect of venom of bees, unintentional 05/06/2018   Herpes zoster with ophthalmic complication 05/06/2018   Moderate persistent asthma, uncomplicated 03/04/2016   Allergic rhinitis due to pollen 03/04/2016   Insect sting allergy, current reaction 03/04/2016   Gastroesophageal reflux disease without esophagitis 03/04/2016   Deviated nasal septum 03/04/2016   Hymenoptera allergy 07/01/2015   Past Medical History:  Diagnosis Date   Asthma    Endometriosis    GERD (gastroesophageal reflux disease)    History of kidney stones    Hydrosalpinx    left   Hyperlipidemia    Repatha   Hypertension    PAI-1 4G/4G genotype    PCOS (polycystic ovarian syndrome)    PONV (postoperative nausea and vomiting)    severe   Seasonal allergies    Shingles    Past Surgical History:  Procedure Laterality Date   ANTERIOR CRUCIATE LIGAMENT REPAIR Right 1995   CHROMOPERTUBATION N/A 05/03/2015   Procedure: CHROMOPERTUBATION;  Surgeon: Fermin Schwab, MD;  Location: [facility];  Service: Gynecology;  Laterality: N/A;   EXTRACORPOREAL SHOCK WAVE LITHOTRIPSY  2008   LAPAROSCOPY N/A 03/28/2015   Procedure: LAPAROSCOPY DIAGNOSTIC;  Surgeon: Olivia Mackie, MD;  Location: [facility];  Service: Gynecology;  Laterality: N/A;   LAPAROSCOPY N/A  05/03/2015   Procedure: LAPAROSCOPY, EXCISION ENDOMETRIOSIS, ENTERO LYSIS, WITH ENDOMETRIAL BIOPSY;  Surgeon: Fermin Schwab, MD;  Location: [facility];  Service: Gynecology;  Laterality: N/A;   NASAL SEPTOPLASTY W/ TURBINOPLASTY  Bilateral 06/20/2017   Procedure: NASAL SEPTOPLASTY WITH BILATERAL TURBINATE REDUCTION;  Surgeon: Christia Reading, MD;  Location: [facility];  Service: ENT;  Laterality: Bilateral;   ROBOTIC ASSISTED TOTAL HYSTERECTOMY N/A 08/16/2021   Procedure: XI ROBOTIC ASSISTED TOTAL HYSTERECTOMY; RIGHT SALPINGO-OOPHERECTOMY;  Surgeon: Carver Fila, MD;  Location: [facility];  Service: Gynecology;  Laterality: N/A;   UNILATERAL SALPINGECTOMY Left 05/03/2015   Procedure: LEFT SALPINGECTOMY;  Surgeon: Fermin Schwab, MD;  Location: [facility];  Service: Gynecology;  Laterality: Left;   WISDOM TOOTH EXTRACTION     Social History   Tobacco Use   Smoking status: Never   Smokeless tobacco: Never  Vaping Use   Vaping status: Never Used  Substance Use Topics   Alcohol use: No   Drug use: No   Social History   Socioeconomic History   Marital status: Married    Spouse name: Not on file   Number of children: 1   Years of education: Not on file   Highest education level: Not on file  Occupational History   Occupation: Publishing rights manager    Employer: [employer]  Tobacco Use   Smoking status: Never   Smokeless tobacco: Never  Vaping Use   Vaping status: Never Used  Substance and Sexual Activity   Alcohol use: No   Drug use: No   Sexual activity: Yes    Partners: Male    Birth control/protection: Pill  Other Topics Concern   Not on file  Social History Narrative   Exercise daily   Social Drivers of Health   Financial Resource Strain: Not on file  Food Insecurity: Not on file  Transportation Needs: Not on file  Physical Activity: Not on file  Stress: Not on file  Social Connections: Unknown (02/18/2022)   [facility]   Social Network    Social Network: Not on file  Intimate Partner Violence: Unknown (01/24/2022)   Received from [employer], [facility]   HITS    Physically Hurt: Not on file    Insult or Talk Down To: Not on file     Threaten Physical Harm: Not on file    Scream or Curse: Not on file   Family Status  Relation Name Status   Mother  Alive   Father  Alive   MGM  Deceased   MGF  Deceased   PGM  Alive   Neg Hx  (Not Specified)  No partnership data on file   Family History  Problem Relation Age of Onset   Allergic rhinitis Mother    Asthma Mother    Eczema Mother    Stroke Father    COPD Maternal Grandmother    Colon cancer Maternal Grandmother    Eczema Maternal Grandfather    Dementia Maternal Grandfather    COPD Maternal Grandfather    Stroke Paternal Grandmother    Breast cancer Paternal Grandmother    Angioedema Neg Hx    Immunodeficiency Neg Hx    Urticaria Neg Hx    Allergies  Allergen Reactions   Bee Venom Anaphylaxis    And wasp venom   Other Anaphylaxis   Pneumococcal Vaccines Anaphylaxis   Atorvastatin     Fatigue, Brain Fog, Muscle Pain   Mixed Vespid Venom  Rosuvastatin     Fatigue, Brain Fog, Muscle Pain   Wasp Venom       ROS    Objective:     BP 120/88 (BP Location: Left Arm, Patient Position: Sitting, Cuff Size: Normal)   Pulse 80   Temp 97.8 F (36.6 C) (Oral)   Resp 18   Ht 5\' 4"  (1.626 m)   Wt 168 lb (76.2 kg)   SpO2 100%   BMI 28.84 kg/m  BP Readings from Last 3 Encounters:  12/22/23 120/88  08/28/23 130/88  06/16/23 110/80   Wt Readings from Last 3 Encounters:  12/22/23 168 lb (76.2 kg)  08/28/23 168 lb (76.2 kg)  06/16/23 168 lb 3.2 oz (76.3 kg)   SpO2 Readings from Last 3 Encounters:  12/22/23 100%  08/28/23 99%  06/16/23 99%      Physical Exam   Results for orders placed or performed in visit on 12/22/23  CBC with Differential/Platelet  Result Value Ref Range   WBC 6.0 4.0 - 10.5 K/uL   RBC 3.67 (L) 3.87 - 5.11 Mil/uL   Hemoglobin 14.8 12.0 - 15.0 g/dL   HCT 16.1 09.6 - 04.5 %   MCV 113.9 Repeated and verified X2. (H) 78.0 - 100.0 fl   MCHC 35.3 30.0 - 36.0 g/dL   RDW 40.9 81.1 - 91.4 %   Platelets 496.0 (H) 150.0 -  400.0 K/uL   Neutrophils Relative % 67.2 43.0 - 77.0 %   Lymphocytes Relative 24.6 12.0 - 46.0 %   Monocytes Relative 6.2 3.0 - 12.0 %   Eosinophils Relative 1.3 0.0 - 5.0 %   Basophils Relative 0.7 0.0 - 3.0 %   Neutro Abs 4.0 1.4 - 7.7 K/uL   Lymphs Abs 1.5 0.7 - 4.0 K/uL   Monocytes Absolute 0.4 0.1 - 1.0 K/uL   Eosinophils Absolute 0.1 0.0 - 0.7 K/uL   Basophils Absolute 0.0 0.0 - 0.1 K/uL  Comprehensive metabolic panel  Result Value Ref Range   Sodium 138 135 - 145 mEq/L   Potassium 4.2 3.5 - 5.1 mEq/L   Chloride 104 96 - 112 mEq/L   CO2 24 19 - 32 mEq/L   Glucose, Bld 86 70 - 99 mg/dL   BUN 19 6 - 23 mg/dL   Creatinine, Ser 7.82 0.40 - 1.20 mg/dL   Total Bilirubin 0.7 0.2 - 1.2 mg/dL   Alkaline Phosphatase 36 (L) 39 - 117 U/L   AST 17 0 - 37 U/L   ALT 15 0 - 35 U/L   Total Protein 7.3 6.0 - 8.3 g/dL   Albumin 4.6 3.5 - 5.2 g/dL   GFR 956.21 >30.86 mL/min   Calcium 9.5 8.4 - 10.5 mg/dL  Lipid panel  Result Value Ref Range   Cholesterol 133 0 - 200 mg/dL   Triglycerides 578.4 0.0 - 149.0 mg/dL   HDL 69.62 >95.28 mg/dL   VLDL 41.3 0.0 - 24.4 mg/dL   LDL Cholesterol 50 0 - 99 mg/dL   Total CHOL/HDL Ratio 2    NonHDL 74.49   TSH  Result Value Ref Range   TSH 0.72 0.35 - 5.50 uIU/mL  VITAMIN D 25 Hydroxy (Vit-D Deficiency, Fractures)  Result Value Ref Range   VITD 45.30 30.00 - 100.00 ng/mL  Insulin, random  Result Value Ref Range   Insulin 11.7 uIU/mL    Last CBC Lab Results  Component Value Date   WBC 6.0 12/22/2023   HGB 14.8 12/22/2023   HCT 41.8 12/22/2023  MCV 113.9 Repeated and verified X2. (H) 12/22/2023   MCH 38.4 (H) 12/04/2021   RDW 13.4 12/22/2023   PLT 496.0 (H) 12/22/2023   Last metabolic panel Lab Results  Component Value Date   GLUCOSE 86 12/22/2023   NA 138 12/22/2023   K 4.2 12/22/2023   CL 104 12/22/2023   CO2 24 12/22/2023   BUN 19 12/22/2023   CREATININE 0.68 12/22/2023   GFR 107.39 12/22/2023   CALCIUM 9.5 12/22/2023   PROT  7.3 12/22/2023   ALBUMIN 4.6 12/22/2023   BILITOT 0.7 12/22/2023   ALKPHOS 36 (L) 12/22/2023   AST 17 12/22/2023   ALT 15 12/22/2023   ANIONGAP 9 12/04/2021   Last lipids Lab Results  Component Value Date   CHOL 133 12/22/2023   HDL 58.10 12/22/2023   LDLCALC 50 12/22/2023   LDLDIRECT 85.0 06/16/2023   TRIG 123.0 12/22/2023   CHOLHDL 2 12/22/2023   Last hemoglobin A1c No results found for: "HGBA1C" Last thyroid functions Lab Results  Component Value Date   TSH 0.72 12/22/2023   T4TOTAL 11.8 08/24/2007   Last vitamin D Lab Results  Component Value Date   VD25OH 45.30 12/22/2023   Last vitamin B12 and Folate No results found for: "VITAMINB12", "FOLATE"    The 10-year ASCVD risk score (Arnett DK, et al., 2019) is: 0.3%    Assessment & Plan:   Problem List Items Addressed This Visit       Unprioritized   Preventative health care   Relevant Medications   losartan (COZAAR) 50 MG tablet   Primary hypertension   Relevant Medications   metoprolol succinate (TOPROL-XL) 50 MG 24 hr tablet   losartan (COZAAR) 50 MG tablet   Other Relevant Orders   CBC with Differential/Platelet (Completed)   Comprehensive metabolic panel (Completed)   Lipid panel (Completed)   TSH (Completed)   VITAMIN D 25 Hydroxy (Vit-D Deficiency, Fractures) (Completed)   Insulin, random (Completed)   Other Visit Diagnoses       Morbid obesity (HCC)       Relevant Medications   Naltrexone-buPROPion HCl ER (CONTRAVE) 8-90 MG TB12   Other Relevant Orders   CBC with Differential/Platelet (Completed)   Comprehensive metabolic panel (Completed)   Lipid panel (Completed)   TSH (Completed)   VITAMIN D 25 Hydroxy (Vit-D Deficiency, Fractures) (Completed)   Insulin, random (Completed)     Assessment and Plan    Weight Management   She is on Contrave 1 BID for weight management, successfully maintaining her weight after losing 80 pounds on a lower dose. Post-hysterectomy hormonal changes have  complicated weight management. She is interested in [place] but [employer] issues and is considering phentermine and compounded medications. Preferring the lowest effective dose to avoid medication tolerance, she will continue Contrave 1 BID. Discuss potential use of phentermine and consider compounded medications if insurance coverage for [facility] is unavailable. Monitor weight and adjust treatment as necessary.  Familial Hypercholesterolemia   Her familial hypercholesterolemia is managed with Repatha due to statin intolerance. She has a high LP(a) level, a genetic marker, and insurance coverage for Repatha depends on her statin intolerance and genetic profile. She is aware of a new non-statin medication used by cardiologists. Continue Repatha as prescribed and monitor lipid levels, adjusting treatment as necessary. Consider alternative non-statin medications if insurance coverage changes.  Hypertension   Her hypertension is managed with Cozaar and metoprolol. Continue both medications as prescribed.        No follow-ups on file.  Donato Schultz, DO

## 2023-12-23 ENCOUNTER — Ambulatory Visit: Payer: 59 | Admitting: Family Medicine

## 2023-12-23 LAB — CBC WITH DIFFERENTIAL/PLATELET
Basophils Absolute: 0 10*3/uL (ref 0.0–0.1)
Basophils Relative: 0.7 % (ref 0.0–3.0)
Eosinophils Absolute: 0.1 10*3/uL (ref 0.0–0.7)
Eosinophils Relative: 1.3 % (ref 0.0–5.0)
HCT: 41.8 % (ref 36.0–46.0)
Hemoglobin: 14.8 g/dL (ref 12.0–15.0)
Lymphocytes Relative: 24.6 % (ref 12.0–46.0)
Lymphs Abs: 1.5 10*3/uL (ref 0.7–4.0)
MCHC: 35.3 g/dL (ref 30.0–36.0)
MCV: 113.9 fl — ABNORMAL HIGH (ref 78.0–100.0)
Monocytes Absolute: 0.4 10*3/uL (ref 0.1–1.0)
Monocytes Relative: 6.2 % (ref 3.0–12.0)
Neutro Abs: 4 10*3/uL (ref 1.4–7.7)
Neutrophils Relative %: 67.2 % (ref 43.0–77.0)
Platelets: 496 10*3/uL — ABNORMAL HIGH (ref 150.0–400.0)
RBC: 3.67 Mil/uL — ABNORMAL LOW (ref 3.87–5.11)
RDW: 13.4 % (ref 11.5–15.5)
WBC: 6 10*3/uL (ref 4.0–10.5)

## 2023-12-23 LAB — LIPID PANEL
Cholesterol: 133 mg/dL (ref 0–200)
HDL: 58.1 mg/dL (ref >39.00)
LDL Cholesterol: 50 mg/dL (ref 0–99)
NonHDL: 74.49
Total CHOL/HDL Ratio: 2
Triglycerides: 123 mg/dL (ref 0.0–149.0)
VLDL: 24.6 mg/dL (ref 0.0–40.0)

## 2023-12-23 LAB — INSULIN, RANDOM: Insulin: 11.7 u[IU]/mL

## 2023-12-23 LAB — TSH: TSH: 0.72 u[IU]/mL (ref 0.35–5.50)

## 2023-12-23 LAB — COMPREHENSIVE METABOLIC PANEL
ALT: 15 U/L (ref 0–35)
AST: 17 U/L (ref 0–37)
Albumin: 4.6 g/dL (ref 3.5–5.2)
Alkaline Phosphatase: 36 U/L — ABNORMAL LOW (ref 39–117)
BUN: 19 mg/dL (ref 6–23)
CO2: 24 meq/L (ref 19–32)
Calcium: 9.5 mg/dL (ref 8.4–10.5)
Chloride: 104 meq/L (ref 96–112)
Creatinine, Ser: 0.68 mg/dL (ref 0.40–1.20)
GFR: 107.39 mL/min (ref >60.00)
Glucose, Bld: 86 mg/dL (ref 70–99)
Potassium: 4.2 meq/L (ref 3.5–5.1)
Sodium: 138 meq/L (ref 135–145)
Total Bilirubin: 0.7 mg/dL (ref 0.2–1.2)
Total Protein: 7.3 g/dL (ref 6.0–8.3)

## 2023-12-23 LAB — VITAMIN D 25 HYDROXY (VIT D DEFICIENCY, FRACTURES): VITD: 45.3 ng/mL (ref 30.00–100.00)

## 2023-12-24 NOTE — Patient Instructions (Signed)
 Preventive Care 16-43 Years Old, Female  Preventive care refers to lifestyle choices and visits with your health care provider that can promote health and wellness. Preventive care visits are also called wellness exams.  What can I expect for my preventive care visit?  Counseling  Your health care provider may ask you questions about your:  Medical history, including:  Past medical problems.  Family medical history.  Pregnancy history.  Current health, including:  Menstrual cycle.  Method of birth control.  Emotional well-being.  Home life and relationship well-being.  Sexual activity and sexual health.  Lifestyle, including:  Alcohol, nicotine or tobacco, and drug use.  Access to firearms.  Diet, exercise, and sleep habits.  Work and work Astronomer.  Sunscreen use.  Safety issues such as seatbelt and bike helmet use.  Physical exam  Your health care provider will check your:  Height and weight. These may be used to calculate your BMI (body mass index). BMI is a measurement that tells if you are at a healthy weight.  Waist circumference. This measures the distance around your waistline. This measurement also tells if you are at a healthy weight and may help predict your risk of certain diseases, such as type 2 diabetes and high blood pressure.  Heart rate and blood pressure.  Body temperature.  Skin for abnormal spots.  What immunizations do I need?    Vaccines are usually given at various ages, according to a schedule. Your health care provider will recommend vaccines for you based on your age, medical history, and lifestyle or other factors, such as travel or where you work.  What tests do I need?  Screening  Your health care provider may recommend screening tests for certain conditions. This may include:  Lipid and cholesterol levels.  Diabetes screening. This is done by checking your blood sugar (glucose) after you have not eaten for a while (fasting).  Pelvic exam and Pap test.  Hepatitis B test.  Hepatitis C  test.  HIV (human immunodeficiency virus) test.  STI (sexually transmitted infection) testing, if you are at risk.  Lung cancer screening.  Colorectal cancer screening.  Mammogram. Talk with your health care provider about when you should start having regular mammograms. This may depend on whether you have a family history of breast cancer.  BRCA-related cancer screening. This may be done if you have a family history of breast, ovarian, tubal, or peritoneal cancers.  Bone density scan. This is done to screen for osteoporosis.  Talk with your health care provider about your test results, treatment options, and if necessary, the need for more tests.  Follow these instructions at home:  Eating and drinking    Eat a diet that includes fresh fruits and vegetables, whole grains, lean protein, and low-fat dairy products.  Take vitamin and mineral supplements as recommended by your health care provider.  Do not drink alcohol if:  Your health care provider tells you not to drink.  You are pregnant, may be pregnant, or are planning to become pregnant.  If you drink alcohol:  Limit how much you have to 0-1 drink a day.  Know how much alcohol is in your drink. In the [place] one drink equals one 12 oz bottle of beer (355 mL), one 5 oz glass of wine (148 mL), or one 1 oz glass of hard liquor (44 mL).  Lifestyle  Brush your teeth every morning and night with fluoride toothpaste. Floss one time each day.  Exercise for at least  30 minutes 5 or more days each week.  Do not use any products that contain nicotine or tobacco. These products include cigarettes, chewing tobacco, and vaping devices, such as e-cigarettes. If you need help quitting, ask your health care provider.  Do not use drugs.  If you are sexually active, practice safe sex. Use a condom or other form of protection to prevent STIs.  If you do not wish to become pregnant, use a form of birth control. If you plan to become pregnant, see your health care provider for a  prepregnancy visit.  Take aspirin only as told by your health care provider. Make sure that you understand how much to take and what form to take. Work with your health care provider to find out whether it is safe and beneficial for you to take aspirin daily.  Find healthy ways to manage stress, such as:  Meditation, yoga, or listening to music.  Journaling.  Talking to a trusted person.  Spending time with friends and family.  Minimize exposure to UV radiation to reduce your risk of skin cancer.  Safety  Always wear your seat belt while driving or riding in a vehicle.  Do not drive:  If you have been drinking alcohol. Do not ride with someone who has been drinking.  When you are tired or distracted.  While texting.  If you have been using any mind-altering substances or drugs.  Wear a helmet and other protective equipment during sports activities.  If you have firearms in your house, make sure you follow all gun safety procedures.  Seek help if you have been physically or sexually abused.  What's next?  Visit your health care provider once a year for an annual wellness visit.  Ask your health care provider how often you should have your eyes and teeth checked.  Stay up to date on all vaccines.  This information is not intended to replace advice given to you by your health care provider. Make sure you discuss any questions you have with your health care provider.  Document Revised: 04/04/2021 Document Reviewed: 04/04/2021  Elsevier Patient Education  2024 [employer].

## 2023-12-25 ENCOUNTER — Encounter: Payer: Self-pay | Admitting: Family Medicine

## 2023-12-25 DIAGNOSIS — J3089 Other allergic rhinitis: Secondary | ICD-10-CM

## 2023-12-25 NOTE — Progress Notes (Signed)
 VIAL MADE 12-25-23. EXP 12-24-24

## 2023-12-26 ENCOUNTER — Other Ambulatory Visit (HOSPITAL_COMMUNITY): Payer: Self-pay

## 2023-12-26 ENCOUNTER — Telehealth: Payer: Self-pay

## 2023-12-26 NOTE — Telephone Encounter (Signed)
 Pharmacy Patient Advocate Encounter   Received notification from CoverMyMeds that prior authorization for Contrave 8-90MG  er tablets is required/requested.   Insurance verification completed.   The patient is insured through [facility] .   Per test claim: PA required; PA started via CoverMyMeds. KEY [id number] . Waiting for clinical questions to populate.

## 2024-01-01 NOTE — Telephone Encounter (Signed)
 Clinical questions have been answered and PA submitted. PA currently Pending.

## 2024-01-02 ENCOUNTER — Other Ambulatory Visit: Payer: Self-pay

## 2024-01-05 ENCOUNTER — Other Ambulatory Visit: Payer: Self-pay

## 2024-01-05 ENCOUNTER — Other Ambulatory Visit (HOSPITAL_COMMUNITY): Payer: Self-pay

## 2024-01-05 NOTE — Telephone Encounter (Signed)
 Pharmacy Patient Advocate Encounter  Received notification from [facility] that Prior Authorization for Contrave 8-90MG  er tablets  has been APPROVED from 01/01/24 to 12/31/24. Ran test claim, Copay is $24.97. This test claim was processed through [facility]- copay amounts may vary at other pharmacies due to pharmacy/plan contracts, or as the patient moves through the different stages of their insurance plan.   PA #/Case ID/Reference #: [id number]

## 2024-01-06 ENCOUNTER — Other Ambulatory Visit (HOSPITAL_COMMUNITY): Payer: Self-pay

## 2024-01-13 ENCOUNTER — Ambulatory Visit (INDEPENDENT_AMBULATORY_CARE_PROVIDER_SITE_OTHER): Payer: 59 | Admitting: *Deleted

## 2024-01-13 DIAGNOSIS — T63441D Toxic effect of venom of bees, accidental (unintentional), subsequent encounter: Secondary | ICD-10-CM | POA: Diagnosis not present

## 2024-01-13 DIAGNOSIS — J309 Allergic rhinitis, unspecified: Secondary | ICD-10-CM

## 2024-01-14 ENCOUNTER — Other Ambulatory Visit: Payer: Self-pay

## 2024-01-14 DIAGNOSIS — H4061X0 Glaucoma secondary to drugs, right eye, stage unspecified: Secondary | ICD-10-CM | POA: Diagnosis not present

## 2024-01-14 DIAGNOSIS — T380X5A Adverse effect of glucocorticoids and synthetic analogues, initial encounter: Secondary | ICD-10-CM | POA: Diagnosis not present

## 2024-01-14 MED ORDER — TIMOLOL MALEATE PF 0.5 % OP SOLN
1.0000 [drp] | Freq: Every day | OPHTHALMIC | 11 refills | Status: AC
Start: 2024-01-14 — End: ?
  Filled 2024-01-14: qty 60, 60d supply, fill #0

## 2024-01-18 ENCOUNTER — Encounter: Payer: Self-pay | Admitting: Family Medicine

## 2024-01-20 ENCOUNTER — Other Ambulatory Visit: Payer: Self-pay

## 2024-01-20 MED ORDER — METFORMIN HCL 1000 MG PO TABS
1000.0000 mg | ORAL_TABLET | Freq: Two times a day (BID) | ORAL | 2 refills | Status: AC
  Filled 2024-01-20: qty 120, 60d supply, fill #0
  Filled 2024-05-09: qty 120, 60d supply, fill #1
  Filled 2024-06-16 – 2024-07-09 (×4): qty 120, 60d supply, fill #2

## 2024-01-29 ENCOUNTER — Ambulatory Visit: Payer: 59 | Admitting: Allergy

## 2024-02-11 ENCOUNTER — Ambulatory Visit (INDEPENDENT_AMBULATORY_CARE_PROVIDER_SITE_OTHER): Payer: Self-pay

## 2024-02-11 DIAGNOSIS — J309 Allergic rhinitis, unspecified: Secondary | ICD-10-CM

## 2024-02-16 ENCOUNTER — Other Ambulatory Visit: Payer: Self-pay

## 2024-02-17 ENCOUNTER — Other Ambulatory Visit: Payer: Self-pay

## 2024-02-17 MED ORDER — NORETHINDRONE ACET-ETHINYL EST 1.5-30 MG-MCG PO TABS
1.0000 | ORAL_TABLET | Freq: Every day | ORAL | 0 refills | Status: DC
  Filled 2024-02-17: qty 21, 21d supply, fill #0
  Filled 2024-11-12: qty 21, 21d supply, fill #1

## 2024-02-18 ENCOUNTER — Telehealth: Payer: Self-pay | Admitting: Allergy

## 2024-02-18 ENCOUNTER — Other Ambulatory Visit: Payer: Self-pay

## 2024-02-18 ENCOUNTER — Other Ambulatory Visit: Payer: Self-pay | Admitting: Allergy

## 2024-02-18 MED ORDER — EPINEPHRINE 0.3 MG/0.3ML IJ SOAJ
0.3000 mg | INTRAMUSCULAR | 1 refills | Status: AC | PRN
  Filled 2024-02-18: qty 2, 9d supply, fill #0
  Filled 2024-10-16: qty 2, 14d supply, fill #1

## 2024-02-18 NOTE — Telephone Encounter (Signed)
 Rachel Hooper called and stated she is going out of town and needs a refill on her epi pen ASAP. She is leaving the day after tomorrow, and needs this called in urgently.

## 2024-03-03 ENCOUNTER — Other Ambulatory Visit: Payer: Self-pay

## 2024-03-03 DIAGNOSIS — Z1231 Encounter for screening mammogram for malignant neoplasm of breast: Secondary | ICD-10-CM | POA: Diagnosis not present

## 2024-03-03 DIAGNOSIS — Z01419 Encounter for gynecological examination (general) (routine) without abnormal findings: Secondary | ICD-10-CM | POA: Diagnosis not present

## 2024-03-03 MED ORDER — PHENTERMINE HCL 37.5 MG PO TABS
37.5000 mg | ORAL_TABLET | Freq: Every morning | ORAL | 2 refills | Status: AC
  Filled 2024-03-03: qty 30, 30d supply, fill #0
  Filled 2024-04-03: qty 30, 30d supply, fill #1
  Filled 2024-07-27: qty 30, 30d supply, fill #2

## 2024-03-03 MED ORDER — NORETHINDRONE ACET-ETHINYL EST 1.5-30 MG-MCG PO TABS
1.0000 | ORAL_TABLET | Freq: Every day | ORAL | 3 refills | Status: AC
  Filled 2024-03-03: qty 84, 63d supply, fill #0
  Filled 2024-03-05: qty 84, 84d supply, fill #0
  Filled 2024-05-26: qty 84, 84d supply, fill #1
  Filled 2024-08-20: qty 84, 84d supply, fill #2
  Filled 2024-11-12 (×2): qty 84, 84d supply, fill #3

## 2024-03-05 ENCOUNTER — Other Ambulatory Visit: Payer: Self-pay

## 2024-03-08 ENCOUNTER — Ambulatory Visit (INDEPENDENT_AMBULATORY_CARE_PROVIDER_SITE_OTHER): Payer: Self-pay

## 2024-03-08 DIAGNOSIS — J309 Allergic rhinitis, unspecified: Secondary | ICD-10-CM

## 2024-03-18 ENCOUNTER — Encounter: Payer: Self-pay | Admitting: Allergy

## 2024-03-18 ENCOUNTER — Other Ambulatory Visit: Payer: Self-pay

## 2024-03-18 ENCOUNTER — Ambulatory Visit: Payer: 59 | Admitting: Allergy

## 2024-03-18 ENCOUNTER — Ambulatory Visit (INDEPENDENT_AMBULATORY_CARE_PROVIDER_SITE_OTHER): Payer: 59 | Admitting: Allergy

## 2024-03-18 VITALS — BP 124/68 | Temp 98.2°F | Resp 16 | Ht 64.0 in | Wt 172.9 lb

## 2024-03-18 DIAGNOSIS — J4540 Moderate persistent asthma, uncomplicated: Secondary | ICD-10-CM

## 2024-03-18 DIAGNOSIS — J3089 Other allergic rhinitis: Secondary | ICD-10-CM

## 2024-03-18 DIAGNOSIS — T63441D Toxic effect of venom of bees, accidental (unintentional), subsequent encounter: Secondary | ICD-10-CM

## 2024-03-18 MED ORDER — CETIRIZINE HCL 10 MG PO TABS
10.0000 mg | ORAL_TABLET | Freq: Every day | ORAL | 11 refills | Status: AC
  Filled 2024-03-18: qty 30, 30d supply, fill #0
  Filled 2024-05-10: qty 90, 90d supply, fill #1
  Filled 2024-08-22: qty 90, 90d supply, fill #2
  Filled 2024-11-16: qty 90, 90d supply, fill #3

## 2024-03-18 MED ORDER — ALBUTEROL SULFATE HFA 108 (90 BASE) MCG/ACT IN AERS
2.0000 | INHALATION_SPRAY | RESPIRATORY_TRACT | 1 refills | Status: AC | PRN
  Filled 2024-03-18: qty 18, 16d supply, fill #0
  Filled 2024-10-16: qty 18, 17d supply, fill #1

## 2024-03-18 MED ORDER — MONTELUKAST SODIUM 10 MG PO TABS
10.0000 mg | ORAL_TABLET | Freq: Every day | ORAL | 11 refills | Status: AC
  Filled 2024-03-18: qty 30, 30d supply, fill #0
  Filled 2024-05-24: qty 90, 90d supply, fill #0
  Filled 2024-08-22: qty 90, 90d supply, fill #1
  Filled 2024-11-12: qty 90, 90d supply, fill #0
  Filled 2024-11-12: qty 90, 90d supply, fill #2
  Filled 2024-11-16: qty 90, 90d supply, fill #1

## 2024-03-18 MED ORDER — NEFFY 2 MG/0.1ML NA SOLN: 1.0000 | NASAL | 1 refills | Status: AC | PRN

## 2024-03-18 MED ORDER — PULMICORT FLEXHALER 180 MCG/ACT IN AEPB
2.0000 | INHALATION_SPRAY | Freq: Two times a day (BID) | RESPIRATORY_TRACT | 3 refills | Status: AC
  Filled 2024-03-18: qty 3, 90d supply, fill #0
  Filled 2024-10-16: qty 3, 90d supply, fill #1

## 2024-03-18 NOTE — Patient Instructions (Addendum)
 Asthma - Under good control! - Discussed today using Pulmicort  on as needed based for asthma flares/respiratory illnesses as below for action plan -Asthma action plan:  Pulmicort  - 2 puffs twice a day for 1-2 weeks or until symptoms have resolved - Continue Montelukast  10 mg once a day for a coughing or wheezing.  Once able to use Pulmicort  as an as needed medicine and asthma remains under control we may be able to discontinue montelukast  to see if control remains. -Use Ventolin  2 puffs every 4-6 hours if needed for wheezing, coughing, shortness of breath or chest tightness.  Monitor frequency of use.   Asthma control goals:  Full participation in all desired activities (may need albuterol  before activity) Albuterol  use two time or less a week on average (not counting use with activity) Cough interfering with sleep two time or less a month Oral steroids no more than once a year No hospitalizations  Allergic rhinitis   - Zyrtec  10 mg once a day.  If able to do do as needed Zyrtec  recommend tapering off daily dose to avoid development of rebound itch  - Rhinocort  1-2 spray per nostril twice a day for stuffy nose  - Montelukast  as above  - Continue allergen immunotherapy per protocol.  Maintenance dosing.  Getting close to the end of this immunotherapy course.  Venom allergy  - continue venom injections at maintenance every 12 weeks  - If you have an insect sting take Benadryl  50 mg every 4 hours and if you have life-threatening symptoms inject him with EpiPen  0.3 mg.  - Discussed today the new epinephrine  nasal device including efficacy.  Use Nephi 1 spray into the nostril for allergic reaction. [facility] of [place]  and if covered I will get shipped in the mail.   Follow-up 12 months or sooner if needed

## 2024-03-18 NOTE — Progress Notes (Signed)
 Follow-up Note  RE: SHELIAH FIORILLO MRN: [id number] DOB: 27-Jun-1981 Date of Office Visit: 03/18/2024   History of present illness: Rachel Hooper is a 43 y.o. female presenting today for follow-up of asthma, allergic rhinitis, hymenoptera allergy.  She was last seen in the office on 12/04/2022 by myself.  She presents today with her son. Discussed the use of AI scribe software for clinical note transcription with the patient, who gave verbal consent to proceed.  She has experienced no asthma flares over the past year and has not required urgent care or emergency department visits nor has she required systemic steroids. She continues to use Pulmicort  every other day and increases the dose when she is sick, although she has not needed to increase the dose recently. She has not used her rescue inhaler and continues to take montelukast  (Singulair ) daily.   She reports doing well during the recent allergy season despite high pollen levels. She continues to use Zyrtec  for her allergies and has not experienced worsening symptoms. She has been receiving environmental allergy shots and is nearing the end of her treatment.  She is in the maintenance dosing.  She has access to an epinephrine  device.  She expresses some apprehension due to past experiences where symptoms returned insidiously after stopping allergy shots she does not want that to happen in the future when stopping her current course.  She has a history of severe reactions to venom stings, including systemic symptoms such as swelling, rash, and gastrointestinal distress. She has been on venom immunotherapy and is currently on a twelve-week maintenance schedule. She has not been stung since starting the shots.  Review of systems: 10pt ROS negative unless noted above in HPI  Past medical/social/surgical/family history have been reviewed and are unchanged unless specifically indicated below.  No changes  Medication List: Current  Outpatient Medications  Medication Sig Dispense Refill   azaTHIOprine  (IMURAN ) 50 MG tablet Take 1 tablet (50 mg total) by mouth 3 (three) times daily. 270 tablet 2   azaTHIOprine  (IMURAN ) 50 MG tablet Take 1 tablet (50 mg total) by mouth 3 (three) times daily. 270 tablet 2   budesonide  (PULMICORT  FLEXHALER) 180 MCG/ACT inhaler Inhale 2 puffs into the lungs 2 (two) times daily to prevent cough or wheeze. Rinse, gargle, spit after use. 3 each 5   cetirizine  (ZYRTEC ) 10 MG tablet Take 1 tablet (10 mg total) by mouth at bedtime. 30 tablet 5   EPINEPHrine  0.3 mg/0.3 mL IJ SOAJ injection Inject 0.3 mg into the muscle as needed for anaphylaxis. 2 each 1   losartan  (COZAAR ) 50 MG tablet Take 1 tablet (50 mg total) by mouth daily. 90 tablet 3   Loteprednol  Etabonate (LOTEMAX  SM) 0.38 % GEL Place 1 drop into both eyes 2 (two) times daily. 5 g 11   Loteprednol  Etabonate (LOTEMAX  SM) 0.38 % GEL Place 1 drop into affected eyes 2 (two) times daily in the morning and at bedtime 5 g 11   magnesium oxide-pyridoxine (BEELITH) 362-20 MG TABS Take 1 tablet by mouth every evening.      metFORMIN  (GLUCOPHAGE ) 1000 MG tablet Take 1 tablet (1,000 mg total) by mouth 2 (two) times daily with a meal. 120 tablet 2   metoprolol  succinate (TOPROL -XL) 50 MG 24 hr tablet Take 1 tablet (50 mg total) by mouth daily. Take with or immediately following a meal. 90 tablet 3   montelukast  (SINGULAIR ) 10 MG tablet Take 1 tablet (10 mg total) by mouth at bedtime. 30  tablet 5   Naltrexone -buPROPion  HCl ER (CONTRAVE ) 8-90 MG TB12 Take 2 tablets twice a day by oral route for 90 days. 360 tablet 0   Naltrexone -buPROPion  HCl ER (CONTRAVE ) 8-90 MG TB12 Take 2 tablets by mouth 2 (two) times daily. 120 tablet 5   Norethindrone  Acetate-Ethinyl Estradiol  (LARIN  1.5/30) 1.5-30 MG-MCG tablet Take 1 tablet by mouth daily. 28 tablet 0   Norethindrone  Acetate-Ethinyl Estradiol  (LARIN  1.5/30) 1.5-30 MG-MCG tablet Take 1 tablet by mouth daily (continuous  active pills). 84 tablet 3   phentermine  (ADIPEX-P ) 37.5 MG tablet Take 1 tablet (37.5 mg total) by mouth every morning. 30 tablet 2   potassium citrate  (UROCIT-K ) 10 MEQ (1080 MG) SR tablet Take 2 tablets (20 mEq total) by mouth 2 (two) times daily. 120 tablet 3   timolol  (TIMOPTIC ) 0.5 % ophthalmic solution Place 1 drop into the right eye daily. 5 mL 11   Timolol  Maleate PF 0.5 % SOLN Place 1 drop into affected eyes 2 (two) times daily in the morning and at noon. 180 each 11   Timolol  Maleate PF 0.5 % SOLN Place 1 drop into the right eye daily. 90 each 11   valACYclovir  (VALTREX ) 1000 MG tablet Take 1 tablet (1,000 mg total) by mouth daily. 90 tablet 11   valACYclovir  (VALTREX ) 1000 MG tablet Take 1 tablet (1,000 mg total) by mouth 2 (two) times daily. 180 tablet 5   albuterol  (VENTOLIN  HFA) 108 (90 Base) MCG/ACT inhaler Inhale 2 puffs into the lungs every 6 (six) hours as needed for wheezing or shortness of breath. 6.7 g 1   No current facility-administered medications for this visit.     Known medication allergies: Allergies  Allergen Reactions   Bee Venom Anaphylaxis    And wasp venom   Other Anaphylaxis   Pneumococcal Vaccines Anaphylaxis   Atorvastatin     Fatigue, Brain Fog, Muscle Pain   Mixed Vespid Venom    Rosuvastatin     Fatigue, Brain Fog, Muscle Pain   Wasp Venom      Physical examination: Blood pressure 124/68, temperature 98.2 F (36.8 C), temperature source Temporal, resp. rate 16, height 5\' 4"  (1.626 m), weight 172 lb 14.4 oz (78.4 kg), SpO2 100%.  General: Alert, interactive, in no acute distress. HEENT: PERRLA, TMs pearly gray, turbinates non-edematous without discharge, post-pharynx non erythematous. Neck: Supple without lymphadenopathy. Lungs: Clear to auscultation without wheezing, rhonchi or rales. {no increased work of breathing. CV: Normal S1, S2 without murmurs. Abdomen: Nondistended, nontender. Skin: Warm and dry, without lesions or  rashes. Extremities:  No clubbing, cyanosis or edema. Neuro:   Grossly intact.  Diagnostics/Labs: None today  Assessment and plan:   Asthma - Under good control! - Discussed today using Pulmicort  on as needed based for asthma flares/respiratory illnesses as below for action plan -Asthma action plan:  Pulmicort  - 2 puffs twice a day for 1-2 weeks or until symptoms have resolved - Continue Montelukast  10 mg once a day for a coughing or wheezing.  Once able to use Pulmicort  as an as needed medicine and asthma remains under control we may be able to discontinue montelukast  to see if control remains. -Use Ventolin  2 puffs every 4-6 hours if needed for wheezing, coughing, shortness of breath or chest tightness.  Monitor frequency of use.   Asthma control goals:  Full participation in all desired activities (may need albuterol  before activity) Albuterol  use two time or less a week on average (not counting use with activity) Cough interfering with  sleep two time or less a month Oral steroids no more than once a year No hospitalizations  Allergic rhinitis   - Zyrtec  10 mg once a day.  If able to do do as needed Zyrtec  recommend tapering off daily dose to avoid development of rebound itch  - Rhinocort  1-2 spray per nostril twice a day for stuffy nose  - Montelukast  as above  - Continue allergen immunotherapy per protocol.  Maintenance dosing.  Getting close to the end of this immunotherapy course.  Venom allergy  - continue venom injections at maintenance every 12 weeks  - If you have an insect sting take Benadryl  50 mg every 4 hours and if you have life-threatening symptoms inject him with EpiPen  0.3 mg.  - Discussed today the new epinephrine  nasal device including efficacy.  Use Nephi 1 spray into the nostril for allergic reaction. [facility] out of [place]  and if covered I will get shipped in the mail.   Follow-up 12 months or sooner if needed  I appreciate  the opportunity to take part in Asiyah's care. Please do not hesitate to contact me with questions.  Sincerely,   Catha Clink, MD Allergy/Immunology [facility]

## 2024-03-19 ENCOUNTER — Other Ambulatory Visit: Payer: Self-pay

## 2024-03-22 ENCOUNTER — Other Ambulatory Visit: Payer: Self-pay

## 2024-03-22 ENCOUNTER — Ambulatory Visit (INDEPENDENT_AMBULATORY_CARE_PROVIDER_SITE_OTHER)

## 2024-03-22 DIAGNOSIS — J309 Allergic rhinitis, unspecified: Secondary | ICD-10-CM

## 2024-03-23 ENCOUNTER — Other Ambulatory Visit: Payer: Self-pay

## 2024-03-26 ENCOUNTER — Other Ambulatory Visit: Payer: Self-pay

## 2024-03-29 ENCOUNTER — Other Ambulatory Visit: Payer: Self-pay

## 2024-03-30 ENCOUNTER — Other Ambulatory Visit: Payer: Self-pay

## 2024-04-01 ENCOUNTER — Telehealth: Payer: Self-pay | Admitting: Allergy

## 2024-04-01 NOTE — Telephone Encounter (Signed)
 Called PT at cell phone, left vm advising of Neffy approval and pickup

## 2024-04-01 NOTE — Telephone Encounter (Signed)
 Pharmacy called to advise Neffy is approved and ready for pickup, but haven't been able to get in touch with PT. Confirmed they had correct phone and advised we would call and leave a message for PT to get in touch with pharmacy

## 2024-04-03 ENCOUNTER — Other Ambulatory Visit: Payer: Self-pay | Admitting: Internal Medicine

## 2024-04-03 ENCOUNTER — Other Ambulatory Visit (HOSPITAL_COMMUNITY): Payer: Self-pay

## 2024-04-03 DIAGNOSIS — E785 Hyperlipidemia, unspecified: Secondary | ICD-10-CM

## 2024-04-03 DIAGNOSIS — Z789 Other specified health status: Secondary | ICD-10-CM

## 2024-04-05 ENCOUNTER — Other Ambulatory Visit: Payer: Self-pay

## 2024-04-05 ENCOUNTER — Other Ambulatory Visit (HOSPITAL_COMMUNITY): Payer: Self-pay

## 2024-04-05 DIAGNOSIS — H401112 Primary open-angle glaucoma, right eye, moderate stage: Secondary | ICD-10-CM | POA: Diagnosis not present

## 2024-04-05 DIAGNOSIS — H17821 Peripheral opacity of cornea, right eye: Secondary | ICD-10-CM | POA: Diagnosis not present

## 2024-04-05 DIAGNOSIS — H16301 Unspecified interstitial keratitis, right eye: Secondary | ICD-10-CM | POA: Diagnosis not present

## 2024-04-05 DIAGNOSIS — H25011 Cortical age-related cataract, right eye: Secondary | ICD-10-CM | POA: Diagnosis not present

## 2024-04-05 DIAGNOSIS — H04123 Dry eye syndrome of bilateral lacrimal glands: Secondary | ICD-10-CM | POA: Diagnosis not present

## 2024-04-05 MED ORDER — REPATHA SURECLICK 140 MG/ML ~~LOC~~ SOAJ
1.0000 mL | SUBCUTANEOUS | 1 refills | Status: DC
  Filled 2024-04-05 – 2024-04-13 (×4): qty 6, 84d supply, fill #0
  Filled 2024-08-20: qty 6, 84d supply, fill #1

## 2024-04-06 ENCOUNTER — Ambulatory Visit (INDEPENDENT_AMBULATORY_CARE_PROVIDER_SITE_OTHER)

## 2024-04-06 DIAGNOSIS — J309 Allergic rhinitis, unspecified: Secondary | ICD-10-CM

## 2024-04-06 DIAGNOSIS — T63441D Toxic effect of venom of bees, accidental (unintentional), subsequent encounter: Secondary | ICD-10-CM | POA: Diagnosis not present

## 2024-04-07 ENCOUNTER — Other Ambulatory Visit: Payer: Self-pay

## 2024-04-07 ENCOUNTER — Other Ambulatory Visit (HOSPITAL_COMMUNITY): Payer: Self-pay

## 2024-04-08 ENCOUNTER — Telehealth: Payer: Self-pay | Admitting: Pharmacy Technician

## 2024-04-08 ENCOUNTER — Other Ambulatory Visit: Payer: Self-pay

## 2024-04-08 ENCOUNTER — Other Ambulatory Visit (HOSPITAL_COMMUNITY): Payer: Self-pay

## 2024-04-08 NOTE — Telephone Encounter (Signed)
 Pharmacy Patient Advocate Encounter   Received notification from Physician's Office / fax that prior authorization for Repatha  is required/requested.   Insurance verification completed.   The patient is insured through [facility] .   Per test claim: PA required; PA submitted to above mentioned insurance via CoverMyMeds Key/confirmation #/EOC [id number] Status is pending

## 2024-04-09 ENCOUNTER — Other Ambulatory Visit: Payer: Self-pay

## 2024-04-09 ENCOUNTER — Other Ambulatory Visit (HOSPITAL_COMMUNITY): Payer: Self-pay

## 2024-04-09 NOTE — Telephone Encounter (Signed)
 Pharmacy Patient Advocate Encounter  Received notification from [employer] that Prior Authorization for Repatha  has been APPROVED from 04/08/24 to 04/07/25. Unable to obtain price due to refill too soon rejection, last fill date 04/08/24 next available fill date08/25/25   PA #/Case ID/Reference #: [id number]

## 2024-04-12 ENCOUNTER — Other Ambulatory Visit: Payer: Self-pay

## 2024-04-13 ENCOUNTER — Other Ambulatory Visit: Payer: Self-pay

## 2024-04-22 ENCOUNTER — Other Ambulatory Visit: Payer: Self-pay

## 2024-04-22 ENCOUNTER — Other Ambulatory Visit (HOSPITAL_COMMUNITY): Payer: Self-pay

## 2024-04-24 ENCOUNTER — Encounter: Admitting: Nurse Practitioner

## 2024-04-24 ENCOUNTER — Telehealth: Admitting: Emergency Medicine

## 2024-04-24 ENCOUNTER — Other Ambulatory Visit (HOSPITAL_COMMUNITY): Payer: Self-pay

## 2024-04-24 DIAGNOSIS — B028 Zoster with other complications: Secondary | ICD-10-CM | POA: Diagnosis not present

## 2024-04-24 DIAGNOSIS — B029 Zoster without complications: Secondary | ICD-10-CM

## 2024-04-24 MED ORDER — GABAPENTIN 100 MG PO CAPS
300.0000 mg | ORAL_CAPSULE | Freq: Two times a day (BID) | ORAL | 0 refills | Status: DC
  Filled 2024-04-24: qty 42, 7d supply, fill #0

## 2024-04-24 MED ORDER — VALACYCLOVIR HCL 1 G PO TABS
1000.0000 mg | ORAL_TABLET | Freq: Three times a day (TID) | ORAL | 0 refills | Status: AC
  Filled 2024-04-24: qty 21, 7d supply, fill #0

## 2024-04-24 MED ORDER — GABAPENTIN 300 MG PO CAPS
300.0000 mg | ORAL_CAPSULE | Freq: Two times a day (BID) | ORAL | 0 refills | Status: DC
  Filled 2024-04-24: qty 14, 7d supply, fill #0

## 2024-04-24 NOTE — Progress Notes (Signed)
 Rachel Hooper has already had an evisit for shingles. She is currently requesting a change in gabapentin  dose. Prescription adjusted as requested. Instructed to follow up with PCP if no improvement in shingles rash after taking additional dose of valtrex  for 4-5 days.

## 2024-04-24 NOTE — Progress Notes (Signed)

## 2024-04-25 MED ORDER — GABAPENTIN 100 MG PO CAPS: 300.0000 mg | ORAL_CAPSULE | Freq: Three times a day (TID) | ORAL | 0 refills | Status: DC

## 2024-04-25 NOTE — Addendum Note (Signed)
 Addended by: Alexsus Papadopoulos M on: 04/25/2024 11:27 AM   Modules accepted: Orders

## 2024-04-26 ENCOUNTER — Other Ambulatory Visit: Payer: Self-pay

## 2024-04-26 ENCOUNTER — Other Ambulatory Visit (HOSPITAL_COMMUNITY): Payer: Self-pay

## 2024-04-26 DIAGNOSIS — R21 Rash and other nonspecific skin eruption: Secondary | ICD-10-CM | POA: Diagnosis not present

## 2024-04-26 MED ORDER — BETAMETHASONE DIPROPIONATE 0.05 % EX OINT
1.0000 | TOPICAL_OINTMENT | Freq: Two times a day (BID) | CUTANEOUS | 0 refills | Status: DC
  Filled 2024-04-26 (×2): qty 30, 15d supply, fill #0

## 2024-04-27 ENCOUNTER — Other Ambulatory Visit (HOSPITAL_COMMUNITY): Payer: Self-pay

## 2024-04-28 ENCOUNTER — Other Ambulatory Visit (HOSPITAL_COMMUNITY): Payer: Self-pay

## 2024-05-04 ENCOUNTER — Other Ambulatory Visit: Payer: Self-pay

## 2024-05-04 ENCOUNTER — Ambulatory Visit (INDEPENDENT_AMBULATORY_CARE_PROVIDER_SITE_OTHER)

## 2024-05-04 DIAGNOSIS — J309 Allergic rhinitis, unspecified: Secondary | ICD-10-CM

## 2024-05-10 ENCOUNTER — Other Ambulatory Visit: Payer: Self-pay

## 2024-05-24 ENCOUNTER — Other Ambulatory Visit: Payer: Self-pay

## 2024-05-26 ENCOUNTER — Other Ambulatory Visit: Payer: Self-pay

## 2024-06-07 ENCOUNTER — Ambulatory Visit (INDEPENDENT_AMBULATORY_CARE_PROVIDER_SITE_OTHER)

## 2024-06-07 DIAGNOSIS — J309 Allergic rhinitis, unspecified: Secondary | ICD-10-CM | POA: Diagnosis not present

## 2024-06-08 ENCOUNTER — Other Ambulatory Visit: Payer: Self-pay

## 2024-06-08 DIAGNOSIS — T380X5A Adverse effect of glucocorticoids and synthetic analogues, initial encounter: Secondary | ICD-10-CM | POA: Diagnosis not present

## 2024-06-08 DIAGNOSIS — H4061X0 Glaucoma secondary to drugs, right eye, stage unspecified: Secondary | ICD-10-CM | POA: Diagnosis not present

## 2024-06-08 DIAGNOSIS — B0052 Herpesviral keratitis: Secondary | ICD-10-CM | POA: Diagnosis not present

## 2024-06-08 DIAGNOSIS — H209 Unspecified iridocyclitis: Secondary | ICD-10-CM | POA: Diagnosis not present

## 2024-06-08 MED ORDER — VALACYCLOVIR HCL 1 G PO TABS
1000.0000 mg | ORAL_TABLET | Freq: Three times a day (TID) | ORAL | 5 refills | Status: AC
  Filled 2024-06-08 – 2024-08-20 (×2): qty 270, 90d supply, fill #0

## 2024-06-08 MED ORDER — AZATHIOPRINE 50 MG PO TABS
50.0000 mg | ORAL_TABLET | Freq: Two times a day (BID) | ORAL | 4 refills | Status: AC
  Filled 2024-06-08: qty 180, 90d supply, fill #0
  Filled 2024-07-14: qty 60, 30d supply, fill #1

## 2024-06-16 ENCOUNTER — Other Ambulatory Visit: Payer: Self-pay

## 2024-06-17 ENCOUNTER — Other Ambulatory Visit: Payer: Self-pay

## 2024-06-18 ENCOUNTER — Encounter: Payer: Self-pay | Admitting: Family Medicine

## 2024-06-18 ENCOUNTER — Other Ambulatory Visit (HOSPITAL_BASED_OUTPATIENT_CLINIC_OR_DEPARTMENT_OTHER): Payer: Self-pay

## 2024-06-18 ENCOUNTER — Ambulatory Visit: Admitting: Family Medicine

## 2024-06-18 VITALS — BP 120/86 | HR 103 | Temp 97.8°F | Resp 18 | Ht 64.0 in | Wt 168.8 lb

## 2024-06-18 DIAGNOSIS — E785 Hyperlipidemia, unspecified: Secondary | ICD-10-CM

## 2024-06-18 DIAGNOSIS — Z0000 Encounter for general adult medical examination without abnormal findings: Secondary | ICD-10-CM | POA: Diagnosis not present

## 2024-06-18 DIAGNOSIS — I10 Essential (primary) hypertension: Secondary | ICD-10-CM

## 2024-06-18 DIAGNOSIS — J40 Bronchitis, not specified as acute or chronic: Secondary | ICD-10-CM

## 2024-06-18 DIAGNOSIS — Z6828 Body mass index (BMI) 28.0-28.9, adult: Secondary | ICD-10-CM

## 2024-06-18 LAB — LIPID PANEL
Cholesterol: 141 mg/dL (ref 0–200)
HDL: 42.1 mg/dL (ref >39.00)
LDL Cholesterol: 56 mg/dL (ref 0–99)
NonHDL: 98.41
Total CHOL/HDL Ratio: 3
Triglycerides: 210 mg/dL — ABNORMAL HIGH (ref 0.0–149.0)
VLDL: 42 mg/dL — ABNORMAL HIGH (ref 0.0–40.0)

## 2024-06-18 LAB — COMPREHENSIVE METABOLIC PANEL WITH GFR
ALT: 21 U/L (ref 0–35)
AST: 23 U/L (ref 0–37)
Albumin: 4.3 g/dL (ref 3.5–5.2)
Alkaline Phosphatase: 34 U/L — ABNORMAL LOW (ref 39–117)
BUN: 17 mg/dL (ref 6–23)
CO2: 22 meq/L (ref 19–32)
Calcium: 9.3 mg/dL (ref 8.4–10.5)
Chloride: 105 meq/L (ref 96–112)
Creatinine, Ser: 0.62 mg/dL (ref 0.40–1.20)
GFR: 109.43 mL/min (ref >60.00)
Glucose, Bld: 84 mg/dL (ref 70–99)
Potassium: 4.3 meq/L (ref 3.5–5.1)
Sodium: 140 meq/L (ref 135–145)
Total Bilirubin: 0.6 mg/dL (ref 0.2–1.2)
Total Protein: 7.2 g/dL (ref 6.0–8.3)

## 2024-06-18 LAB — CBC WITH DIFFERENTIAL/PLATELET
Basophils Absolute: 0 K/uL (ref 0.0–0.1)
Basophils Relative: 0.6 % (ref 0.0–3.0)
Eosinophils Absolute: 0 K/uL (ref 0.0–0.7)
Eosinophils Relative: 0.6 % (ref 0.0–5.0)
HCT: 41.8 % (ref 36.0–46.0)
Hemoglobin: 14.4 g/dL (ref 12.0–15.0)
Lymphocytes Relative: 20 % (ref 12.0–46.0)
Lymphs Abs: 1 K/uL (ref 0.7–4.0)
MCHC: 34.5 g/dL (ref 30.0–36.0)
MCV: 111.2 fl — ABNORMAL HIGH (ref 78.0–100.0)
Monocytes Absolute: 0.4 K/uL (ref 0.1–1.0)
Monocytes Relative: 8 % (ref 3.0–12.0)
Neutro Abs: 3.4 K/uL (ref 1.4–7.7)
Neutrophils Relative %: 70.8 % (ref 43.0–77.0)
Platelets: 452 K/uL — ABNORMAL HIGH (ref 150.0–400.0)
RBC: 3.76 Mil/uL — ABNORMAL LOW (ref 3.87–5.11)
RDW: 11.9 % (ref 11.5–15.5)
WBC: 4.9 K/uL (ref 4.0–10.5)

## 2024-06-18 LAB — TSH: TSH: 0.41 u[IU]/mL (ref 0.35–5.50)

## 2024-06-18 MED ORDER — AZITHROMYCIN 250 MG PO TABS
ORAL_TABLET | ORAL | 0 refills | Status: AC
  Filled 2024-06-18: qty 6, 5d supply, fill #0

## 2024-06-18 NOTE — Progress Notes (Signed)
 Subjective:    Patient ID: Rachel Hooper, female    DOB: Oct 21, 1981, 43 y.o.   MRN: [id number]  Chief Complaint  Patient presents with   Annual Exam    Pt states fasting     HPI Patient is in today for cpe.   Discussed the use of AI scribe software for clinical note transcription with the patient, who gave verbal consent to proceed.  History of Present Illness Rachel Hooper is a 43 year old female who presents for an annual physical exam.  She is recovering from a recent respiratory illness circulating in [place] and [place], characterized by wheezing, bronchial symptoms, chills, and significant mucus production. She increased her Pulmicort  to TID dosing and used albuterol  as needed. She tested negative for COVID-19 and flu. She has been wearing a mask at work to prevent spreading the illness.  In early July, she experienced a shingles flare, which was managed by reducing her azathioprine  from three pills to two and increasing Valtrex  to TID dosing. She also used gabapentin  for shingles-related pain, which was a one-time treatment.  She has a history of PCOS and has been using birth control as recommended by her previous clinician to help manage her symptoms. She was previously on Contrave  for weight management, which was switched to phentermine  for a four-month break. She lost nine pounds on phentermine  before a PredPak was prescribed, leading to a temporary cessation of phentermine .  She has a history of familial hypercholesterolemia, hypertension, and fatty liver. She has been in contact with her insurance regarding coverage for weight loss medications like Wegovy  and Zepbound, but current coverage is limited to severe cardiac conditions.  She has multiple eye specialists due to her ocular conditions, including ocular immunology, glaucoma, and corneal issues. She is currently using Pulmicort  and albuterol  for her respiratory symptoms and has been managing her asthma with  these medications.    Past Medical History:  Diagnosis Date   Asthma    Endometriosis    GERD (gastroesophageal reflux disease)    History of kidney stones    Hydrosalpinx    left   Hyperlipidemia    Repatha    Hypertension    PAI-1 4G/4G genotype    PCOS (polycystic ovarian syndrome)    PONV (postoperative nausea and vomiting)    severe   Seasonal allergies    Shingles     Past Surgical History:  Procedure Laterality Date   ANTERIOR CRUCIATE LIGAMENT REPAIR Right 1995   CHROMOPERTUBATION N/A 05/03/2015   Procedure: CHROMOPERTUBATION;  Surgeon: Cynthia Loss, MD;  Location: [facility];  Service: Gynecology;  Laterality: N/A;   EXTRACORPOREAL SHOCK WAVE LITHOTRIPSY  2008   LAPAROSCOPY N/A 03/28/2015   Procedure: LAPAROSCOPY DIAGNOSTIC;  Surgeon: Charlie Flowers, MD;  Location: [facility];  Service: Gynecology;  Laterality: N/A;   LAPAROSCOPY N/A 05/03/2015   Procedure: LAPAROSCOPY, EXCISION ENDOMETRIOSIS, ENTERO LYSIS, WITH ENDOMETRIAL BIOPSY;  Surgeon: Cynthia Loss, MD;  Location: [facility];  Service: Gynecology;  Laterality: N/A;   NASAL SEPTOPLASTY W/ TURBINOPLASTY Bilateral 06/20/2017   Procedure: NASAL SEPTOPLASTY WITH BILATERAL TURBINATE REDUCTION;  Surgeon: Carlie Clark, MD;  Location: [facility];  Service: ENT;  Laterality: Bilateral;   ROBOTIC ASSISTED TOTAL HYSTERECTOMY N/A 08/16/2021   Procedure: XI ROBOTIC ASSISTED TOTAL HYSTERECTOMY; RIGHT SALPINGO-OOPHERECTOMY;  Surgeon: Viktoria Comer SAUNDERS, MD;  Location: [facility];  Service: Gynecology;  Laterality: N/A;   UNILATERAL SALPINGECTOMY Left 05/03/2015   Procedure: LEFT SALPINGECTOMY;  Surgeon: Cynthia Loss, MD;  Location: [facility];  Service: Gynecology;  Laterality: Left;   WISDOM TOOTH EXTRACTION      Family History  Problem Relation Age of Onset   Allergic rhinitis Mother    Asthma Mother    Eczema Mother    Stroke Father    COPD Maternal Grandmother    Colon  cancer Maternal Grandmother    Eczema Maternal Grandfather    Dementia Maternal Grandfather    COPD Maternal Grandfather    Stroke Paternal Grandmother    Breast cancer Paternal Grandmother    Angioedema Neg Hx    Immunodeficiency Neg Hx    Urticaria Neg Hx     Social History   Socioeconomic History   Marital status: Married    Spouse name: Not on file   Number of children: 1   Years of education: Not on file   Highest education level: Not on file  Occupational History   Occupation: Publishing rights manager    Employer: [employer]  Tobacco Use   Smoking status: Never   Smokeless tobacco: Never  Vaping Use   Vaping status: Never Used  Substance and Sexual Activity   Alcohol use: No   Drug use: No   Sexual activity: Yes    Partners: Male    Birth control/protection: Pill  Other Topics Concern   Not on file  Social History Narrative   Exercise daily   Social Drivers of Health   Financial Resource Strain: Not on file  Food Insecurity: Not on file  Transportation Needs: Not on file  Physical Activity: Not on file  Stress: Not on file  Social Connections: Unknown (02/18/2022)   Received from [facility]   Social Network    Social Network: Not on file  Intimate Partner Violence: Unknown (01/24/2022)   Received from [facility]   HITS    Physically Hurt: Not on file    Insult or Talk Down To: Not on file    Threaten Physical Harm: Not on file    Scream or Curse: Not on file    Outpatient Medications Prior to Visit  Medication Sig Dispense Refill   albuterol  (VENTOLIN  HFA) 108 (90 Base) MCG/ACT inhaler Inhale 2 puffs into the lungs every 4 (four) hours as needed for wheezing or shortness of breath. 18 g 1   azaTHIOprine  (IMURAN ) 50 MG tablet Take 1 tablet (50 mg total) by mouth 3 (three) times daily. 270 tablet 2   azaTHIOprine  (IMURAN ) 50 MG tablet Take 1 tablet (50 mg total) by mouth 3 (three) times daily. 270 tablet 2   azaTHIOprine  (IMURAN ) 50 MG tablet Take 1  tablet (50 mg total) by mouth 2 (two) times daily. 180 tablet 4   budesonide  (PULMICORT  FLEXHALER) 180 MCG/ACT inhaler Inhale 2 puffs into the lungs 2 (two) times daily to prevent cough or wheeze. Rinse, gargle, spit after use. 3 each 3   cetirizine  (ZYRTEC ) 10 MG tablet Take 1 tablet (10 mg total) by mouth at bedtime. 30 tablet 11   EPINEPHrine  (NEFFY ) 2 MG/0.1ML SOLN Place 1 spray into the nose as needed. 6 each 1   EPINEPHrine  0.3 mg/0.3 mL IJ SOAJ injection Inject 0.3 mg into the muscle as needed for anaphylaxis. 2 each 1   Evolocumab  (REPATHA  SURECLICK) 140 MG/ML SOAJ Inject 140 mg into the skin every 14 (fourteen) days. 6 mL 1   losartan  (COZAAR ) 50 MG tablet Take 1 tablet (50 mg total) by mouth daily. 90 tablet 3   Loteprednol  Etabonate (LOTEMAX   SM) 0.38 % GEL Place 1 drop into both eyes 2 (two) times daily. 5 g 11   Loteprednol  Etabonate (LOTEMAX  SM) 0.38 % GEL Place 1 drop into affected eyes 2 (two) times daily in the morning and at bedtime 5 g 11   magnesium oxide-pyridoxine (BEELITH) 362-20 MG TABS Take 1 tablet by mouth every evening.      metFORMIN  (GLUCOPHAGE ) 1000 MG tablet Take 1 tablet (1,000 mg total) by mouth 2 (two) times daily with a meal. 120 tablet 2   metoprolol  succinate (TOPROL -XL) 50 MG 24 hr tablet Take 1 tablet (50 mg total) by mouth daily. Take with or immediately following a meal. 90 tablet 3   montelukast  (SINGULAIR ) 10 MG tablet Take 1 tablet (10 mg total) by mouth at bedtime. 30 tablet 11   Naltrexone -buPROPion  HCl ER (CONTRAVE ) 8-90 MG TB12 Take 2 tablets twice a day by oral route for 90 days. 360 tablet 0   Naltrexone -buPROPion  HCl ER (CONTRAVE ) 8-90 MG TB12 Take 2 tablets by mouth 2 (two) times daily. 120 tablet 5   Norethindrone  Acetate-Ethinyl Estradiol  (LARIN  1.5/30) 1.5-30 MG-MCG tablet Take 1 tablet by mouth daily. 28 tablet 0   Norethindrone  Acetate-Ethinyl Estradiol  (LARIN  1.5/30) 1.5-30 MG-MCG tablet Take 1 tablet by mouth daily (continuous active pills).  84 tablet 3   phentermine  (ADIPEX-P ) 37.5 MG tablet Take 1 tablet (37.5 mg total) by mouth every morning. 30 tablet 2   potassium citrate  (UROCIT-K ) 10 MEQ (1080 MG) SR tablet Take 2 tablets (20 mEq total) by mouth 2 (two) times daily. 120 tablet 3   timolol  (TIMOPTIC ) 0.5 % ophthalmic solution Place 1 drop into the right eye daily. 5 mL 11   Timolol  Maleate PF 0.5 % SOLN Place 1 drop into affected eyes 2 (two) times daily in the morning and at noon. 180 each 11   Timolol  Maleate PF 0.5 % SOLN Place 1 drop into the right eye daily. 90 each 11   valACYclovir  (VALTREX ) 1000 MG tablet Take 1 tablet (1,000 mg total) by mouth 2 (two) times daily. 180 tablet 5   valACYclovir  (VALTREX ) 1000 MG tablet Take 1 tablet (1,000 mg total) by mouth 3 (three) times daily. 270 tablet 5   betamethasone  dipropionate (DIPROLENE ) 0.05 % ointment Apply 1 Application topically 2 (two) times daily. 30 g 0   gabapentin  (NEURONTIN ) 100 MG capsule Take 3 capsules (300 mg total) by mouth 3 (three) times daily. 63 capsule 0   No facility-administered medications prior to visit.    Allergies  Allergen Reactions   Bee Venom Anaphylaxis    And wasp venom   Other Anaphylaxis   Pneumococcal Vaccines Anaphylaxis   Atorvastatin     Fatigue, Brain Fog, Muscle Pain   Mixed Vespid Venom    Rosuvastatin     Fatigue, Brain Fog, Muscle Pain   Wasp Venom     Review of Systems  Constitutional:  Negative for chills, fever and malaise/fatigue.  HENT:  Negative for congestion and hearing loss.   Eyes:  Negative for blurred vision and discharge.  Respiratory:  Negative for cough, sputum production and shortness of breath.   Cardiovascular:  Negative for chest pain, palpitations and leg swelling.  Gastrointestinal:  Negative for abdominal pain, blood in stool, constipation, diarrhea, heartburn, nausea and vomiting.  Genitourinary:  Negative for dysuria, frequency, hematuria and urgency.  Musculoskeletal:  Negative for back pain,  falls and myalgias.  Skin:  Negative for rash.  Neurological:  Negative for dizziness, sensory change, loss  of consciousness, weakness and headaches.  Endo/Heme/Allergies:  Negative for environmental allergies. Does not bruise/bleed easily.  Psychiatric/Behavioral:  Negative for depression and suicidal ideas. The patient is not nervous/anxious and does not have insomnia.        Objective:    Physical Exam Vitals and nursing note reviewed.  Constitutional:      General: She is not in acute distress.    Appearance: Normal appearance. She is well-developed.  HENT:     Head: Normocephalic and atraumatic.     Right Ear: Tympanic membrane, ear canal and external ear normal. There is no impacted cerumen.     Left Ear: Tympanic membrane, ear canal and external ear normal. There is no impacted cerumen.     Nose: Nose normal.     Mouth/Throat:     Mouth: Mucous membranes are moist.     Pharynx: Oropharynx is clear. No oropharyngeal exudate or posterior oropharyngeal erythema.  Eyes:     General: No scleral icterus.       Right eye: No discharge.        Left eye: No discharge.     Conjunctiva/sclera: Conjunctivae normal.     Pupils: Pupils are equal, round, and reactive to light.  Neck:     Thyroid : No thyromegaly or thyroid  tenderness.     Vascular: No JVD.  Cardiovascular:     Rate and Rhythm: Normal rate and regular rhythm.     Heart sounds: Normal heart sounds. No murmur heard. Pulmonary:     Effort: Pulmonary effort is normal. No respiratory distress.     Breath sounds: Normal breath sounds.  Abdominal:     General: Bowel sounds are normal. There is no distension.     Palpations: Abdomen is soft. There is no mass.     Tenderness: There is no abdominal tenderness. There is no guarding or rebound.  Genitourinary:    Vagina: Normal.  Musculoskeletal:        General: Normal range of motion.     Cervical back: Normal range of motion and neck supple.     Right lower leg: No edema.      Left lower leg: No edema.  Lymphadenopathy:     Cervical: No cervical adenopathy.  Skin:    General: Skin is warm and dry.     Findings: No erythema or rash.  Neurological:     Mental Status: She is alert and oriented to person, place, and time.     Cranial Nerves: No cranial nerve deficit.     Deep Tendon Reflexes: Reflexes are normal and symmetric.  Psychiatric:        Mood and Affect: Mood normal.        Behavior: Behavior normal.        Thought Content: Thought content normal.        Judgment: Judgment normal.     BP 120/86 (BP Location: Left Arm, Patient Position: Sitting, Cuff Size: Normal)   Pulse (!) 103   Temp 97.8 F (36.6 C) (Oral)   Resp 18   Ht 5' 4 (1.626 m)   Wt 168 lb 12.8 oz (76.6 kg)   LMP 06/07/2017   SpO2 100%   BMI 28.97 kg/m  Wt Readings from Last 3 Encounters:  06/18/24 168 lb 12.8 oz (76.6 kg)  03/18/24 172 lb 14.4 oz (78.4 kg)  12/22/23 168 lb (76.2 kg)    Diabetic Foot Exam - Simple   No data filed    Lab Results  Component Value Date   WBC 4.9 06/18/2024   HGB 14.4 06/18/2024   HCT 41.8 06/18/2024   PLT 452.0 (H) 06/18/2024   GLUCOSE 84 06/18/2024   CHOL 141 06/18/2024   TRIG 210.0 (H) 06/18/2024   HDL 42.10 06/18/2024   LDLDIRECT 85.0 06/16/2023   LDLCALC 56 06/18/2024   ALT 21 06/18/2024   AST 23 06/18/2024   NA 140 06/18/2024   K 4.3 06/18/2024   CL 105 06/18/2024   CREATININE 0.62 06/18/2024   BUN 17 06/18/2024   CO2 22 06/18/2024   TSH 0.41 06/18/2024    Lab Results  Component Value Date   TSH 0.41 06/18/2024   Lab Results  Component Value Date   WBC 4.9 06/18/2024   HGB 14.4 06/18/2024   HCT 41.8 06/18/2024   MCV 111.2 Repeated and verified X2. (H) 06/18/2024   PLT 452.0 (H) 06/18/2024   Lab Results  Component Value Date   NA 140 06/18/2024   K 4.3 06/18/2024   CO2 22 06/18/2024   GLUCOSE 84 06/18/2024   BUN 17 06/18/2024   CREATININE 0.62 06/18/2024   BILITOT 0.6 06/18/2024   ALKPHOS 34 (L)  06/18/2024   AST 23 06/18/2024   ALT 21 06/18/2024   PROT 7.2 06/18/2024   ALBUMIN 4.3 06/18/2024   CALCIUM 9.3 06/18/2024   ANIONGAP 9 12/04/2021   GFR 109.43 06/18/2024   Lab Results  Component Value Date   CHOL 141 06/18/2024   Lab Results  Component Value Date   HDL 42.10 06/18/2024   Lab Results  Component Value Date   LDLCALC 56 06/18/2024   Lab Results  Component Value Date   TRIG 210.0 (H) 06/18/2024   Lab Results  Component Value Date   CHOLHDL 3 06/18/2024   No results found for: HGBA1C     Assessment & Plan:  Preventative health care Assessment & Plan: Ghm utd Check  labs  See AVS  Health Maintenance  Topic Date Due   HIV Screening  Never done   Pneumococcal Vaccine (1 of 2 - PCV) Never done   Hepatitis B Vaccines 19-59 Average Risk (1 of 3 - 19+ 3-dose series) Never done   HPV VACCINES (1 - Risk 3-dose SCDM series) Never done   COVID-19 Vaccine (6 - 2024-25 season) 06/22/2023   INFLUENZA VACCINE  05/21/2024   DTaP/Tdap/Td (2 - Tdap) 05/06/2027   Hepatitis C Screening  Completed   Meningococcal B Vaccine  Aged Out     Orders: -     CBC with Differential/Platelet  Primary hypertension Assessment & Plan: Well controlled, no changes to meds. Encouraged heart healthy diet such as the DASH diet and exercise as tolerated.    Orders: -     Comprehensive metabolic panel with GFR -     Lipid panel -     TSH  Morbid obesity (HCC)  Hyperlipidemia, unspecified hyperlipidemia type Assessment & Plan: Encourage heart healthy diet such as MIND or DASH diet, increase exercise, avoid trans fats, simple carbohydrates and processed foods, consider a krill or fish or flaxseed oil cap daily.    Orders: -     Comprehensive metabolic panel with GFR -     Lipid panel -     TSH  Bronchitis -     Azithromycin ; Take 2 tablets on day 1, then 1 tablet daily on days 2 through 5  Dispense: 6 tablet; Refill: 0   Assessment and Plan Assessment & Plan Adult  Wellness Visit  During her routine adult wellness visit, there were no changes in family history. She experienced a shingles flare in early July, managed with azathioprine  adjustment and increased Valtrex . She currently lacks an Web designer provider. Wheezing was noted during the exam, likely due to a recent respiratory illness. Perform a routine wellness exam, discuss weight management options including previous use of Contrave  and phentermine , and address concerns about the cost and long-term use of Wegovy  and Zepbound. Discuss OBGYN care options and respiratory symptoms management.  Asthma   Her asthma has recently exacerbated with increased wheezing, likely due to a recent respiratory illness. Current management includes Pulmicort  and albuterol . A previous trial of Arsupra was ineffective and caused an asthma flare. Continue Pulmicort  and albuterol  as needed and avoid Arsupra due to the previous adverse reaction.  Recent acute bronchitis or lower respiratory tract infection   She recently experienced a lower respiratory tract infection with wheezing and bronchial symptoms, but no fever. Significant mucus production and wheezing were noted. She tested negative for COVID-19 and flu. Symptoms are improving, but wheezing persists. She plans to travel to [place]  in about a week. Prescribe Z-Pak for persistent symptoms and use inhalers as needed.  Morbid obesity due to excess calories   She has morbid obesity with previous successful weight loss on Contrave  and phentermine . There are concerns about the long-term use and cost of medications like Wegovy  and Zepbound. The current plan includes cycling between Contrave  and phentermine . Continue the current weight management plan and discuss potential future options for weight management medications.   Preslie Depasquale R Lowne Chase, DO

## 2024-06-20 NOTE — Assessment & Plan Note (Signed)
 Ghm utd Check  labs  See AVS  Health Maintenance  Topic Date Due   HIV Screening  Never done   Pneumococcal Vaccine (1 of 2 - PCV) Never done   Hepatitis B Vaccines 19-59 Average Risk (1 of 3 - 19+ 3-dose series) Never done   HPV VACCINES (1 - Risk 3-dose SCDM series) Never done   COVID-19 Vaccine (6 - 2024-25 season) 06/22/2023   INFLUENZA VACCINE  05/21/2024   DTaP/Tdap/Td (2 - Tdap) 05/06/2027   Hepatitis C Screening  Completed   Meningococcal B Vaccine  Aged Out

## 2024-06-20 NOTE — Assessment & Plan Note (Signed)
 Encourage heart healthy diet such as MIND or DASH diet, increase exercise, avoid trans fats, simple carbohydrates and processed foods, consider a krill or fish or flaxseed oil cap daily.

## 2024-06-20 NOTE — Assessment & Plan Note (Signed)
 Well controlled, no changes to meds. Encouraged heart healthy diet such as the DASH diet and exercise as tolerated.

## 2024-06-21 ENCOUNTER — Ambulatory Visit: Payer: Self-pay | Admitting: Family Medicine

## 2024-07-06 ENCOUNTER — Ambulatory Visit (INDEPENDENT_AMBULATORY_CARE_PROVIDER_SITE_OTHER)

## 2024-07-06 DIAGNOSIS — T63441D Toxic effect of venom of bees, accidental (unintentional), subsequent encounter: Secondary | ICD-10-CM | POA: Diagnosis not present

## 2024-07-09 ENCOUNTER — Other Ambulatory Visit: Payer: Self-pay

## 2024-07-14 ENCOUNTER — Other Ambulatory Visit: Payer: Self-pay

## 2024-07-14 DIAGNOSIS — T380X5A Adverse effect of glucocorticoids and synthetic analogues, initial encounter: Secondary | ICD-10-CM | POA: Diagnosis not present

## 2024-07-14 DIAGNOSIS — H4061X0 Glaucoma secondary to drugs, right eye, stage unspecified: Secondary | ICD-10-CM | POA: Diagnosis not present

## 2024-07-27 ENCOUNTER — Other Ambulatory Visit: Payer: Self-pay

## 2024-08-03 ENCOUNTER — Ambulatory Visit (INDEPENDENT_AMBULATORY_CARE_PROVIDER_SITE_OTHER)

## 2024-08-03 DIAGNOSIS — J309 Allergic rhinitis, unspecified: Secondary | ICD-10-CM

## 2024-08-04 DIAGNOSIS — J3089 Other allergic rhinitis: Secondary | ICD-10-CM | POA: Diagnosis not present

## 2024-08-04 DIAGNOSIS — J3081 Allergic rhinitis due to animal (cat) (dog) hair and dander: Secondary | ICD-10-CM | POA: Diagnosis not present

## 2024-08-04 NOTE — Progress Notes (Signed)
 VIAL MADE 08-04-24

## 2024-08-05 ENCOUNTER — Telehealth: Payer: Self-pay | Admitting: Family Medicine

## 2024-08-05 ENCOUNTER — Other Ambulatory Visit: Payer: Self-pay

## 2024-08-05 NOTE — Telephone Encounter (Signed)
 Copied from CRM [id number]. Topic: General - Billing Inquiry >> Aug 05, 2024  1:01 PM Franky GRADE wrote: Reason for CRM: Patient is receiving a bill for DOS 12/22/2023 & 06/18/2024, per patient her insurance informed that both were submitted as her annual physical and they don't cover two physicals in the same year; however, patient states the visit on 12/22/2023 was just a follow up and the appointment in August was her annual physical. She attempted to contact billing but was unable to get a hold of anyone. She is an employee and sees patient's as well and asked to be left a detailed message.

## 2024-08-09 ENCOUNTER — Telehealth: Payer: Self-pay | Admitting: Family Medicine

## 2024-08-09 NOTE — Telephone Encounter (Signed)
 Copied from CRM #[id number]. Topic: General - Billing Inquiry >> Aug 09, 2024  1:59 PM Rachel Hooper wrote: Reason for CRM: Reason for CRM: Pt is following up from CRM from 08/05/24 that states Patient is receiving a bill for DOS 12/22/2023 & 06/18/2024, per patient her insurance informed that both were submitted as her annual physical and they don't cover two physicals in the same year; however, patient states the visit on 12/22/2023 was just a follow up and the appointment in August was her annual physical. She attempted to contact billing but was unable to get a hold of anyone. She is an employee and sees patient's as well and asked to be left a detailed message.[name] She would really like a call back from anyone. Whether its office manager, office administrator, just someone that can assure her its being taken care of. She doesn't want to call in to service excellence with [facility]. She wants to know when it is re coded and [place] figures out what is really owed, if she is due to receive refund can she get back from actual office without having to go through billing, states she does not want to deal with billing as they always hesitate to give that money back.

## 2024-08-11 NOTE — Telephone Encounter (Signed)
 Please see telephone note from 10/16.

## 2024-08-11 NOTE — Telephone Encounter (Signed)
 LMOVM making pt aware that our coding dept is looking into this and as soon as I hear back from them, I will call and give her an update.

## 2024-08-12 NOTE — Telephone Encounter (Signed)
 Left detailed message on VM giving status update.

## 2024-08-20 ENCOUNTER — Other Ambulatory Visit: Payer: Self-pay

## 2024-08-20 MED ORDER — POTASSIUM CITRATE ER 10 MEQ (1080 MG) PO TBCR
20.0000 meq | EXTENDED_RELEASE_TABLET | Freq: Two times a day (BID) | ORAL | 3 refills | Status: AC
  Filled 2024-08-20: qty 360, 90d supply, fill #0

## 2024-08-23 ENCOUNTER — Other Ambulatory Visit: Payer: Self-pay

## 2024-09-01 ENCOUNTER — Ambulatory Visit

## 2024-09-01 DIAGNOSIS — J309 Allergic rhinitis, unspecified: Secondary | ICD-10-CM | POA: Diagnosis not present

## 2024-09-13 ENCOUNTER — Other Ambulatory Visit: Payer: Self-pay | Admitting: Family Medicine

## 2024-09-13 ENCOUNTER — Encounter: Payer: Self-pay | Admitting: Family Medicine

## 2024-09-13 MED ORDER — CONTRAVE 8-90 MG PO TB12: 2.0000 | ORAL_TABLET | Freq: Two times a day (BID) | ORAL | 5 refills | Status: AC

## 2024-09-20 ENCOUNTER — Ambulatory Visit

## 2024-09-20 DIAGNOSIS — T63441D Toxic effect of venom of bees, accidental (unintentional), subsequent encounter: Secondary | ICD-10-CM

## 2024-09-21 ENCOUNTER — Ambulatory Visit

## 2024-09-21 DIAGNOSIS — J309 Allergic rhinitis, unspecified: Secondary | ICD-10-CM | POA: Diagnosis not present

## 2024-09-28 ENCOUNTER — Ambulatory Visit

## 2024-10-04 ENCOUNTER — Ambulatory Visit

## 2024-10-04 DIAGNOSIS — J309 Allergic rhinitis, unspecified: Secondary | ICD-10-CM

## 2024-10-05 ENCOUNTER — Other Ambulatory Visit: Payer: Self-pay

## 2024-10-05 MED ORDER — VALACYCLOVIR HCL 1 G PO TABS
1000.0000 mg | ORAL_TABLET | Freq: Three times a day (TID) | ORAL | 5 refills | Status: AC
  Filled 2024-10-05: qty 270, 90d supply, fill #0

## 2024-10-05 MED ORDER — AZATHIOPRINE 50 MG PO TABS
50.0000 mg | ORAL_TABLET | Freq: Two times a day (BID) | ORAL | 4 refills | Status: AC
  Filled 2024-10-05: qty 180, 90d supply, fill #0

## 2024-10-05 MED ORDER — MIEBO 1.338 GM/ML OP SOLN
1.0000 [drp] | Freq: Four times a day (QID) | OPHTHALMIC | 11 refills | Status: AC
  Filled 2024-10-05: qty 9, 22d supply, fill #0
  Filled 2024-11-02: qty 9, 22d supply, fill #1

## 2024-10-06 ENCOUNTER — Other Ambulatory Visit: Payer: Self-pay

## 2024-10-11 ENCOUNTER — Ambulatory Visit

## 2024-10-11 DIAGNOSIS — J309 Allergic rhinitis, unspecified: Secondary | ICD-10-CM

## 2024-10-16 ENCOUNTER — Other Ambulatory Visit: Payer: Self-pay

## 2024-10-18 ENCOUNTER — Other Ambulatory Visit: Payer: Self-pay

## 2024-10-19 ENCOUNTER — Other Ambulatory Visit: Payer: Self-pay

## 2024-10-27 ENCOUNTER — Other Ambulatory Visit (HOSPITAL_COMMUNITY): Payer: Self-pay

## 2024-11-02 ENCOUNTER — Other Ambulatory Visit (HOSPITAL_COMMUNITY): Payer: Self-pay

## 2024-11-02 ENCOUNTER — Other Ambulatory Visit: Payer: Self-pay

## 2024-11-03 ENCOUNTER — Other Ambulatory Visit: Payer: Self-pay

## 2024-11-03 ENCOUNTER — Encounter (HOSPITAL_COMMUNITY): Payer: Self-pay

## 2024-11-03 ENCOUNTER — Encounter (HOSPITAL_COMMUNITY): Payer: Self-pay | Admitting: Pharmacist

## 2024-11-03 ENCOUNTER — Other Ambulatory Visit (HOSPITAL_COMMUNITY): Payer: Self-pay

## 2024-11-04 ENCOUNTER — Other Ambulatory Visit: Payer: Self-pay

## 2024-11-04 ENCOUNTER — Other Ambulatory Visit (HOSPITAL_COMMUNITY): Payer: Self-pay

## 2024-11-08 ENCOUNTER — Telehealth (HOSPITAL_BASED_OUTPATIENT_CLINIC_OR_DEPARTMENT_OTHER): Payer: Self-pay | Admitting: Internal Medicine

## 2024-11-08 ENCOUNTER — Other Ambulatory Visit: Payer: Self-pay | Admitting: Internal Medicine

## 2024-11-08 ENCOUNTER — Other Ambulatory Visit: Payer: Self-pay

## 2024-11-08 DIAGNOSIS — E785 Hyperlipidemia, unspecified: Secondary | ICD-10-CM

## 2024-11-08 DIAGNOSIS — Z789 Other specified health status: Secondary | ICD-10-CM

## 2024-11-08 MED ORDER — REPATHA SURECLICK 140 MG/ML ~~LOC~~ SOAJ
1.0000 mL | SUBCUTANEOUS | 0 refills | Status: DC
  Filled 2024-11-08: qty 6, 84d supply, fill #0

## 2024-11-08 NOTE — Telephone Encounter (Signed)
" °*  STAT* If patient is at the pharmacy, call can be transferred to refill team.   1. Which medications need to be refilled? (please list name of each medication and dose if known)   Evolocumab  (REPATHA  SURECLICK) 140 MG/ML SOAJ     2. Would you like to learn more about the convenience, safety, & potential cost savings by using the [facility]?     3. Are you open to using the [facility] (Type [facility].    4. Which pharmacy/location (including street and city if local pharmacy) is medication to be sent to? [facility]    5. Do they need a 30 day or 90 day supply? 90    Patient has an appt scheduled for 02/16/25  Patient is out of medication  "

## 2024-11-09 ENCOUNTER — Other Ambulatory Visit: Payer: Self-pay

## 2024-11-09 ENCOUNTER — Ambulatory Visit (INDEPENDENT_AMBULATORY_CARE_PROVIDER_SITE_OTHER)

## 2024-11-09 ENCOUNTER — Other Ambulatory Visit (HOSPITAL_COMMUNITY): Payer: Self-pay

## 2024-11-09 DIAGNOSIS — J3089 Other allergic rhinitis: Secondary | ICD-10-CM

## 2024-11-09 DIAGNOSIS — J302 Other seasonal allergic rhinitis: Secondary | ICD-10-CM

## 2024-11-09 MED ORDER — REPATHA SURECLICK 140 MG/ML ~~LOC~~ SOAJ
1.0000 mL | SUBCUTANEOUS | 0 refills | Status: AC
  Filled 2024-11-09 (×2): qty 6, 84d supply, fill #0

## 2024-11-12 ENCOUNTER — Other Ambulatory Visit: Payer: Self-pay

## 2024-11-12 ENCOUNTER — Other Ambulatory Visit (HOSPITAL_COMMUNITY): Payer: Self-pay

## 2024-11-16 ENCOUNTER — Other Ambulatory Visit: Payer: Self-pay

## 2024-11-16 ENCOUNTER — Other Ambulatory Visit (HOSPITAL_COMMUNITY): Payer: Self-pay

## 2024-11-16 ENCOUNTER — Other Ambulatory Visit: Payer: Self-pay | Admitting: Internal Medicine

## 2024-11-16 NOTE — Telephone Encounter (Signed)
 Not a patient of [facility]

## 2024-12-14 ENCOUNTER — Ambulatory Visit

## 2024-12-17 ENCOUNTER — Ambulatory Visit: Admitting: Family Medicine

## 2025-02-16 ENCOUNTER — Encounter (HOSPITAL_BASED_OUTPATIENT_CLINIC_OR_DEPARTMENT_OTHER): Admitting: Nurse Practitioner
